# Patient Record
Sex: Female | Born: 1995 | Race: Black or African American | Hispanic: No | Marital: Single | Smoking: Never smoker
Health system: Southern US, Community
[De-identification: ages and names within clinical notes are randomized; demographics above are authoritative.]

## PROBLEM LIST (undated history)

## (undated) DIAGNOSIS — D649 Anemia, unspecified: Secondary | ICD-10-CM

## (undated) DIAGNOSIS — E282 Polycystic ovarian syndrome: Secondary | ICD-10-CM

## (undated) DIAGNOSIS — R519 Headache, unspecified: Secondary | ICD-10-CM

## (undated) DIAGNOSIS — K921 Melena: Secondary | ICD-10-CM

## (undated) DIAGNOSIS — J45909 Unspecified asthma, uncomplicated: Secondary | ICD-10-CM

## (undated) DIAGNOSIS — R87629 Unspecified abnormal cytological findings in specimens from vagina: Secondary | ICD-10-CM

## (undated) HISTORY — PX: TYMPANOSTOMY TUBE PLACEMENT: SHX32

## (undated) HISTORY — DX: Melena: K92.1

## (undated) HISTORY — PX: TONSILLECTOMY: SUR1361

## (undated) HISTORY — DX: Unspecified abnormal cytological findings in specimens from vagina: R87.629

## (undated) HISTORY — DX: Anemia, unspecified: D64.9

## (undated) HISTORY — DX: Headache, unspecified: R51.9

---
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
## 2017-01-22 ENCOUNTER — Other Ambulatory Visit (HOSPITAL_COMMUNITY)
Admission: RE | Admit: 2017-01-22 | Discharge: 2017-01-22 | Disposition: A | Discharge status: Home / Self Care | Payer: Medicaid Other | Source: Ambulatory Visit | Attending: Family Medicine | Admitting: Family Medicine

## 2017-01-22 ENCOUNTER — Other Ambulatory Visit: Payer: Medicaid Other | Admitting: *Deleted

## 2017-01-22 DIAGNOSIS — N898 Other specified noninflammatory disorders of vagina: Secondary | ICD-10-CM | POA: Diagnosis present

## 2017-01-22 NOTE — Progress Notes (Signed)
Pt is here today c/o a vaginal discharge which is whitish-yellow and is purulent with an "acidic" smell to it.  Denies any itching or irritation.  Has a history of recurrent BV.  Self swab performed and sent to lab.  GC/CT testing added as well per patient request.  Reviewed steps to prevent recurrent BV with the patient and will notify her of results and treat as necessary when results are available.

## 2017-01-23 LAB — CERVICOVAGINAL ANCILLARY ONLY
BACTERIAL VAGINITIS: POSITIVE — AB
CHLAMYDIA, DNA PROBE: POSITIVE — AB
Candida vaginitis: NEGATIVE
Neisseria Gonorrhea: NEGATIVE
Trichomonas: NEGATIVE

## 2017-01-30 ENCOUNTER — Encounter: Payer: Self-pay | Admitting: *Deleted

## 2017-01-30 ENCOUNTER — Telehealth: Payer: Self-pay | Admitting: *Deleted

## 2017-01-30 DIAGNOSIS — A749 Chlamydial infection, unspecified: Secondary | ICD-10-CM

## 2017-01-30 MED ORDER — AZITHROMYCIN 500 MG PO TABS: 1000.0000 mg | ORAL_TABLET | Freq: Once | ORAL | 1 refills | Status: AC

## 2017-01-30 NOTE — Telephone Encounter (Signed)
-----   Message from Federico FlakeKimberly Niles Newton, MD sent at 01/29/2017  9:43 AM EDT ----- Chlamydia positive. Please call in azithromycin 1000mg  with 1 refill to allow for patient treatment and partner treatment. If > 1 partner recommend partners go to health department for treatment/screening.

## 2017-01-30 NOTE — Addendum Note (Signed)
Addended by: Arne ClevelandHUTCHINSON, Suezette Lafave J on: 01/30/2017 08:15 AM   Modules accepted: Orders

## 2017-01-30 NOTE — Telephone Encounter (Signed)
Called pt on two numbers that were in her chart, both numbers disconnected at this time.  Called the emergency contact number which is pt's mother.  No answer, left message to have Ameliyah call the office.

## 2017-02-03 ENCOUNTER — Emergency Department (HOSPITAL_COMMUNITY)
Admission: EM | Admit: 2017-02-03 | Discharge: 2017-02-03 | Disposition: A | Discharge status: Home / Self Care | Payer: Medicaid Other | Attending: Physician Assistant | Admitting: Physician Assistant

## 2017-02-03 ENCOUNTER — Encounter (HOSPITAL_COMMUNITY): Payer: Self-pay | Admitting: Emergency Medicine

## 2017-02-03 ENCOUNTER — Telehealth: Payer: Self-pay | Admitting: *Deleted

## 2017-02-03 DIAGNOSIS — J45909 Unspecified asthma, uncomplicated: Secondary | ICD-10-CM | POA: Insufficient documentation

## 2017-02-03 DIAGNOSIS — B9689 Other specified bacterial agents as the cause of diseases classified elsewhere: Secondary | ICD-10-CM

## 2017-02-03 DIAGNOSIS — N760 Acute vaginitis: Secondary | ICD-10-CM

## 2017-02-03 DIAGNOSIS — Z202 Contact with and (suspected) exposure to infections with a predominantly sexual mode of transmission: Secondary | ICD-10-CM | POA: Insufficient documentation

## 2017-02-03 DIAGNOSIS — N898 Other specified noninflammatory disorders of vagina: Secondary | ICD-10-CM | POA: Insufficient documentation

## 2017-02-03 DIAGNOSIS — A749 Chlamydial infection, unspecified: Secondary | ICD-10-CM

## 2017-02-03 DIAGNOSIS — Z113 Encounter for screening for infections with a predominantly sexual mode of transmission: Secondary | ICD-10-CM | POA: Diagnosis present

## 2017-02-03 LAB — WET PREP, GENITAL
Clue Cells Wet Prep HPF POC: NONE SEEN
Sperm: NONE SEEN
Trich, Wet Prep: NONE SEEN
YEAST WET PREP: NONE SEEN

## 2017-02-03 MED ORDER — METRONIDAZOLE 500 MG PO TABS: 500.0000 mg | ORAL_TABLET | Freq: Two times a day (BID) | ORAL | 0 refills | Status: DC

## 2017-02-03 MED ORDER — CEFTRIAXONE SODIUM 250 MG IJ SOLR
250.0000 mg | Freq: Once | INTRAMUSCULAR | Status: AC
  Administered 2017-02-03: 250 mg via INTRAMUSCULAR
  Filled 2017-02-03: qty 250

## 2017-02-03 MED ORDER — LIDOCAINE HCL (PF) 1 % IJ SOLN
INTRAMUSCULAR | Status: AC
  Administered 2017-02-03: 5 mL
  Filled 2017-02-03: qty 5

## 2017-02-03 MED ORDER — AZITHROMYCIN 250 MG PO TABS
1000.0000 mg | ORAL_TABLET | Freq: Once | ORAL | Status: AC
  Administered 2017-02-03: 1000 mg via ORAL
  Filled 2017-02-03: qty 4

## 2017-02-03 MED ORDER — AZITHROMYCIN 500 MG PO TABS: 1000.0000 mg | ORAL_TABLET | Freq: Once | ORAL | 0 refills | Status: AC

## 2017-02-03 MED ORDER — FLUCONAZOLE 150 MG PO TABS: 150.0000 mg | ORAL_TABLET | Freq: Once | ORAL | 0 refills | Status: AC

## 2017-02-03 NOTE — ED Provider Notes (Signed)
[facility]-EMERG[facility]Y DEPT Provider Note   CSN: [id number] Arrival date & time: 02/03/17  1606  By signing my name below, I, Deland PrettySherilynn Knight, attest that this documentation has been prepared under the direction and in the presence of Aysa Larivee, PA-C. Electronically Signed: Deland PrettySherilynn Knight, ED Scribe. 02/03/17. 7:35 PM.  History   Chief Complaint Chief Complaint  Patient presents with  . SEXUALLY TRANSMITTED DISEASE   The history is provided by the patient. No language interpreter was used.    HPI Comments: Victorina Corliss BlackerC Olejnik is a 21 y.o. female who presents to the Emergency Department for a foul odor from vaginal discharge. She states that she was diagnosed with chlamydia (and BV) [facility] on 01/21/2017 and wanted to obtain a second opinion. She states that she was diagnosed with chlamydia before, was treated, and when she obtained a second opinion, did not have chlamydia. She is currently sexually active and her partner is getting tested tomorrow. The pt denies chills, fever, abdominal pain, nausea, vomiting, dysuria, hematuria, and pelvic pain.  Past Medical History:  Diagnosis Date  . Asthma   . Blood in stool     Patient Active Problem List   Diagnosis Date Noted  . PCOS (polycystic ovarian syndrome) 02/22/2015  . Vaginitis 07/11/2014  . Constipation 12/31/2013  . Blood in stool   . Anemia 07/01/2013  . Dysfunctional uterine bleeding 07/01/2013  . Belching 07/01/2013  . Lower abdominal pain 07/01/2013  . BMI (body mass index), pediatric, 85% to less than 95% for age 57/08/2012  . Vitamin D insufficiency 05/30/2013  . ECZEMA 06/02/2008    Past Surgical History:  Procedure Laterality Date  . TONSILLECTOMY    . TYMPANOSTOMY TUBE PLACEMENT      OB History    Gravida Para Term Preterm AB Living   0 0 0 0 0 0   SAB TAB Ectopic Multiple Live Births   0 0 0 0         Home Medications    Prior to Admission medications   Medication Sig Start Date End Date  Taking? Authorizing Provider  azithromycin (ZITHROMAX) 500 MG tablet Take 2 tablets (1,000 mg total) by mouth once. 02/03/17 02/03/17  Federico FlakeNewton, Kimberly Niles, MD  fluconazole (DIFLUCAN) 150 MG tablet Take 1 tablet (150 mg total) by mouth once. 02/03/17 02/03/17  Federico FlakeNewton, Kimberly Niles, MD  ibuprofen (ADVIL,MOTRIN) 600 MG tablet Take 1 tablet (600 mg total) by mouth every 6 (six) hours as needed. Patient not taking: Reported on 01/22/2017 09/24/16   Elpidio AnisUpstill, Shari, PA-C  metroNIDAZOLE (FLAGYL) 500 MG tablet Take 1 tablet (500 mg total) by mouth 2 (two) times daily. 02/03/17   Federico FlakeNewton, Kimberly Niles, MD  naproxen (NAPROSYN) 500 MG tablet Take 1 tablet (500 mg total) by mouth 2 (two) times daily with a meal. Patient not taking: Reported on 01/22/2017 06/28/16   Riki SheerYoung, Michelle G, PA-C    Family History Family History  Problem Relation Age of Onset  . Epilepsy Mother     Social History Social History  Substance Use Topics  . Smoking status: Never Smoker  . Smokeless tobacco: Never Used  . Alcohol use No     Allergies   Cherry and Peach [prunus persica]   Review of Systems Review of Systems  Constitutional: Negative for chills and fever.  Gastrointestinal: Negative for abdominal pain, nausea and vomiting.  Genitourinary: Positive for vaginal discharge (foul odor). Negative for dysuria, hematuria and pelvic pain.     Physical Exam Updated Vital  Signs BP 120/65 (BP Location: Left Arm)   Pulse 75   Temp 98.2 F (36.8 C) (Oral)   Ht 5\' 6"  (1.676 m)   Wt 97.1 kg (214 lb)   LMP 01/06/2017   SpO2 100%   BMI 34.54 kg/m   Physical Exam  Constitutional: She appears well-developed and well-nourished. No distress.  HENT:  Head: Normocephalic and atraumatic.  Eyes: Conjunctivae and EOM are normal. No scleral icterus.  Neck: Normal range of motion.  Pulmonary/Chest: Effort normal. No respiratory distress.  Genitourinary: There is no rash or tenderness on the right labia. There is no rash or  tenderness on the left labia. Cervix exhibits no motion tenderness. Right adnexum displays no tenderness. Left adnexum displays no tenderness. Vaginal discharge found.  Neurological: She is alert.  Skin: No rash noted. She is not diaphoretic.  Psychiatric: She has a normal mood and affect.  Nursing note and vitals reviewed.    ED Treatments / Results   DIAGNOSTIC STUDIES: Oxygen Saturation is 100% on RA, normal by my interpretation.   COORDINATION OF CARE: 7:27 PM-Discussed next steps with pt. Pt verbalized understanding and is agreeable with the plan.   Labs (all labs ordered are listed, but only abnormal results are displayed) Labs Reviewed  WET PREP, GENITAL - Abnormal; Notable for the following:       Result Value   WBC, Wet Prep HPF POC MANY (*)    All other components within normal limits  HIV ANTIBODY (ROUTINE TESTING)  RPR  GC/CHLAMYDIA PROBE AMP ([place]) NOT AT [facility]    EKG  EKG Interpretation None       Radiology No results found.  Procedures Procedures (including critical care time)  Medications Ordered in ED Medications  cefTRIAXone (ROCEPHIN) injection 250 mg (not administered)  azithromycin (ZITHROMAX) tablet 1,000 mg (not administered)  lidocaine (PF) (XYLOCAINE) 1 % injection (not administered)     Initial Impression / Assessment and Plan / ED Course  I have reviewed the triage vital signs and the nursing notes.  Pertinent labs & imaging results that were available during my care of the patient were reviewed by me and considered in my medical decision making (see chart for details).     Patient presents to the ED for STD testing. She states that she was tested positive for chlamydia on June 26 at [facility] but reports "I want a second opinion." She denies any vaginal discharge or abdominal pain. She states that she does have foul-smelling odor from vaginal area. She denies any urinary symptoms. On physical exam there is white vaginal  discharge present. There is no cervical motion tenderness or adnexal tenderness present which is low suspicion for ovarian abscess or PID. No visible lesions or rashes present. Gonorrhea Chlamydia, RPR and HIV pending. Patient states that she has prescription for Flagyl given by the women[facility] clinic today for BV, which she does not have on today's results. Advised patient to follow up with the results of her remaining lab work. She appears stable for discharge at this time. Strict return precautions given.  Final Clinical Impressions(s) / ED Diagnoses   Final diagnoses:  STD exposure  Vaginal discharge    New Prescriptions New Prescriptions   No medications on file   I personally performed the services described in this documentation, which was scribed in my presence. The recorded information has been reviewed and is accurate.     Dietrich Pates, PA-C 02/03/17 2057    Abelino Derrick, MD 02/03/17  2102  

## 2017-02-03 NOTE — ED Notes (Signed)
Pt would like a second opinion on possible STD diagnosis. No somplaints of s/s. States her urine smells more acidic

## 2017-02-03 NOTE — ED Triage Notes (Signed)
PT. Stated [facility] and tested positive for Chlamydia and I want a second opinion

## 2017-02-03 NOTE — Telephone Encounter (Signed)
Pt returned call in regards to test results, informed her of + CT and BV on culture.  Sent medications to pharmacy and instructed on medication use.  Pt asked for Diflucan to be sent as well as a prophylactic for yeast due to antibiotic use. Instructed pt to refrain from sexual contact until partner was properly treated.  Pt acknowledged instructions.

## 2017-02-03 NOTE — Discharge Instructions (Signed)
Please read attached information regarding your condition.  Return to ED for worsening pain, increased discharge, abnormal bleeding, fever, vomiting.

## 2017-02-03 NOTE — ED Notes (Signed)
Pt verbalized understanding of d/c instructions and has no further questions. Pt is stable, A&Ox4, VSS.  

## 2017-02-04 LAB — GC/CHLAMYDIA PROBE AMP (~~LOC~~) NOT AT ARMC
Chlamydia: POSITIVE — AB
NEISSERIA GONORRHEA: NEGATIVE

## 2017-02-04 LAB — HIV ANTIBODY (ROUTINE TESTING W REFLEX): HIV SCREEN 4TH GENERATION: NONREACTIVE

## 2017-02-04 LAB — RPR: RPR Ser Ql: NONREACTIVE

## 2017-02-21 ENCOUNTER — Encounter (HOSPITAL_COMMUNITY): Payer: Self-pay | Admitting: Emergency Medicine

## 2017-02-21 ENCOUNTER — Emergency Department (HOSPITAL_COMMUNITY)
Admission: EM | Admit: 2017-02-21 | Discharge: 2017-02-21 | Disposition: A | Discharge status: Home / Self Care | Payer: Medicaid Other | Attending: Emergency Medicine | Admitting: Emergency Medicine

## 2017-02-21 DIAGNOSIS — Y929 Unspecified place or not applicable: Secondary | ICD-10-CM | POA: Diagnosis not present

## 2017-02-21 DIAGNOSIS — Y999 Unspecified external cause status: Secondary | ICD-10-CM | POA: Insufficient documentation

## 2017-02-21 DIAGNOSIS — J45909 Unspecified asthma, uncomplicated: Secondary | ICD-10-CM | POA: Insufficient documentation

## 2017-02-21 DIAGNOSIS — Y9389 Activity, other specified: Secondary | ICD-10-CM | POA: Insufficient documentation

## 2017-02-21 DIAGNOSIS — S61307A Unspecified open wound of left little finger with damage to nail, initial encounter: Secondary | ICD-10-CM | POA: Insufficient documentation

## 2017-02-21 DIAGNOSIS — S61309A Unspecified open wound of unspecified finger with damage to nail, initial encounter: Secondary | ICD-10-CM

## 2017-02-21 DIAGNOSIS — S6992XA Unspecified injury of left wrist, hand and finger(s), initial encounter: Secondary | ICD-10-CM | POA: Diagnosis present

## 2017-02-21 DIAGNOSIS — W231XXA Caught, crushed, jammed, or pinched between stationary objects, initial encounter: Secondary | ICD-10-CM | POA: Diagnosis not present

## 2017-02-21 MED ORDER — LIDOCAINE HCL (PF) 1 % IJ SOLN
10.0000 mL | Freq: Once | INTRAMUSCULAR | Status: AC
  Administered 2017-02-21: 10 mL via INTRADERMAL
  Filled 2017-02-21: qty 10

## 2017-02-21 NOTE — ED Triage Notes (Signed)
Pt presents with injury to the left pinky nail , acrylic on nail; some blood present; pt states the nail has been lifted off the finger; unsure what to do

## 2017-02-21 NOTE — ED Provider Notes (Signed)
[facility] Provider Note   CSN: [id number] Arrival date & time: 02/21/17  0209     History   Chief Complaint Chief Complaint  Patient presents with  . Finger Injury    HPI [name] Collier is a 21 y.o. female with a hx of asthma, PCOS, anemia presents to the Emergency Department complaining of acute, persistent, left pinky pain onset around 2 AM after catching her fingernail and bending it back towards her hand.  She reports a [place] amount of bleeding and associated pain.  Pt denies diabetes, HIV, IVDU.  She does have acrylic nails.  No treatments PTA.  Nothing makes it better; Palpation makes it worse.  Associated symptoms include fever, chills, numbness, decreased movement.     The history is provided by the patient and medical records. No language interpreter was used.    Past Medical History:  Diagnosis Date  . Asthma   . Blood in stool     Patient Active Problem List   Diagnosis Date Noted  . PCOS (polycystic ovarian syndrome) 02/22/2015  . Vaginitis 07/11/2014  . Constipation 12/31/2013  . Blood in stool   . Anemia 07/01/2013  . Dysfunctional uterine bleeding 07/01/2013  . Belching 07/01/2013  . Lower abdominal pain 07/01/2013  . BMI (body mass index), pediatric, 85% to less than 95% for age 79/08/2012  . Vitamin D insufficiency 05/30/2013  . ECZEMA 06/02/2008    Past Surgical History:  Procedure Laterality Date  . TONSILLECTOMY    . TYMPANOSTOMY TUBE PLACEMENT      OB History    Gravida Para Term Preterm AB Living   0 0 0 0 0 0   SAB TAB Ectopic Multiple Live Births   0 0 0 0         Home Medications    Prior to Admission medications   Medication Sig Start Date End Date Taking? Authorizing Provider  ibuprofen (ADVIL,MOTRIN) 600 MG tablet Take 1 tablet (600 mg total) by mouth every 6 (six) hours as needed. Patient not taking: Reported on 01/22/2017 09/24/16   Elpidio AnisUpstill, Shari, PA-C  metroNIDAZOLE (FLAGYL) 500 MG tablet Take 1 tablet (500 mg  total) by mouth 2 (two) times daily. 02/03/17   Federico FlakeNewton, Kimberly Niles, MD  naproxen (NAPROSYN) 500 MG tablet Take 1 tablet (500 mg total) by mouth 2 (two) times daily with a meal. Patient not taking: Reported on 01/22/2017 06/28/16   Riki SheerYoung, Michelle G, PA-C    Family History Family History  Problem Relation Age of Onset  . Epilepsy Mother     Social History Social History  Substance Use Topics  . Smoking status: Never Smoker  . Smokeless tobacco: Never Used  . Alcohol use No     Allergies   Cherry and Peach [prunus persica]   Review of Systems Review of Systems  Constitutional: Negative for appetite change, diaphoresis, fatigue, fever and unexpected weight change.  HENT: Negative for mouth sores.   Eyes: Negative for visual disturbance.  Respiratory: Negative for cough, chest tightness, shortness of breath and wheezing.   Cardiovascular: Negative for chest pain.  Gastrointestinal: Negative for abdominal pain, constipation, diarrhea, nausea and vomiting.  Endocrine: Negative for polydipsia, polyphagia and polyuria.  Genitourinary: Negative for dysuria, frequency, hematuria and urgency.  Musculoskeletal: Negative for back pain and neck stiffness.  Skin: Positive for wound. Negative for rash.  Allergic/Immunologic: Negative for immunocompromised state.  Neurological: Negative for syncope, light-headedness and headaches.  Hematological: Does not bruise/bleed easily.  Psychiatric/Behavioral: Negative for sleep  disturbance. The patient is not nervous/anxious.   All other systems reviewed and are negative.    Physical Exam Updated Vital Signs BP 123/65 (BP Location: Left Arm)   Pulse 81   Temp 97.7 F (36.5 C) (Oral)   Resp 18   SpO2 100%   Physical Exam  Constitutional: She appears well-developed and well-nourished. No distress.  HENT:  Head: Normocephalic and atraumatic.  Eyes: Conjunctivae are normal.  Neck: Normal range of motion.  Cardiovascular: Normal rate,  regular rhythm and intact distal pulses.   Capillary refill < 3 sec  Pulmonary/Chest: Effort normal and breath sounds normal.  Musculoskeletal: She exhibits no edema.  Left little finger with partially avulsed acrylic nail.  Full range of motion of left little finger, hemostasis achieved. Sensation intact to the distal tip. Drinks 5/5 with flexion and extension of the DIP, PIP and MCP of the little finger.  Neurological: She is alert. She exhibits normal muscle tone. Coordination normal.  Skin: Skin is warm and dry. She is not diaphoretic.  No tenting of the skin  Psychiatric: She has a normal mood and affect.  Nursing note and vitals reviewed.    ED Treatments / Results   Procedures .Nail Removal Date/Time: 02/21/2017 5:23 AM Performed by: Dierdre ForthMUTHERSBAUGH, Amit Meloy Authorized by: Dierdre ForthMUTHERSBAUGH, Pandora Mccrackin   Consent:    Consent obtained:  Verbal   Consent given by:  Patient   Risks discussed:  Bleeding, incomplete removal and infection   Alternatives discussed:  No treatment Location:    Hand:  L Sensing finger Pre-procedure details:    Skin preparation:  ChloraPrep   Preparation: Patient was prepped and draped in the usual sterile fashion   Anesthesia (see MAR for exact dosages):    Anesthesia method:  Nerve block   Block anesthetic:  Lidocaine 1% w/o epi   Block injection procedure:  Anatomic landmarks identified, introduced needle, incremental injection and negative aspiration for blood   Block outcome:  Anesthesia achieved Nail Removal:    Nail removed:  Complete   Nail bed repaired: no (no laceration)     Stented with:  Petroleum gauze Post-procedure details:    Dressing:  Xeroform gauze   Patient tolerance of procedure:  Tolerated well, no immediate complications   (including critical care time)  Medications Ordered in ED Medications  lidocaine (PF) (XYLOCAINE) 1 % injection 10 mL (10 mLs Intradermal Given 02/21/17 0503)     Initial Impression / Assessment and Plan / ED  Course  I have reviewed the triage vital signs and the nursing notes.  Pertinent labs & imaging results that were available during my care of the patient were reviewed by me and considered in my medical decision making (see chart for details).     Patient presents with partially avulsed left little nail. Nail completely removed after anesthesia achieved. No laceration to the nail bed for which suturing would be possible. Hemostasis achieved prior to nail removal. Due to acrylic nail, original was unable to be replaced and anchored. Nailbed was stented with petroleum gauze. Dressing applied. Wound care instructions given. Patient states understanding and is in agreement with the plan. No current evidence of secondary infection. Discussed reasons to return immediately to the emergency department including signs and symptoms of infection.  Final Clinical Impressions(s) / ED Diagnoses   Final diagnoses:  Nail avulsion, finger, initial encounter    New Prescriptions New Prescriptions   No medications on file     Milta DeitersMuthersbaugh, Armenta Erskin, PA-C 02/21/17 [id number]  Azalia Bilis, MD 02/22/17 [phone]

## 2017-02-21 NOTE — Discharge Instructions (Signed)
1. Medications: Tylenol or ibuprofen for pain, usual home medications 2. Treatment: rest, drink plenty of fluids, leave bandage in place for the first 24 hours. Gently remove and wash with warm soap and water. Do not remove petroleum gauze use for splinting. 3. Follow Up: Please followup with your primary doctor in 3-5 days for discussion of your diagnoses and further evaluation after today's visit; if you do not have a primary care doctor use the resource guide provided to find one; Please return to the ER for purulent drainage, redness, worsening pain or signs of secondary infection.

## 2017-03-04 ENCOUNTER — Emergency Department (HOSPITAL_COMMUNITY)
Admission: EM | Admit: 2017-03-04 | Discharge: 2017-03-04 | Disposition: A | Discharge status: Home / Self Care | Payer: Medicaid Other | Attending: Emergency Medicine | Admitting: Emergency Medicine

## 2017-03-04 ENCOUNTER — Encounter (HOSPITAL_COMMUNITY): Payer: Self-pay

## 2017-03-04 ENCOUNTER — Other Ambulatory Visit: Payer: Medicaid Other | Admitting: *Deleted

## 2017-03-04 DIAGNOSIS — Z5321 Procedure and treatment not carried out due to patient leaving prior to being seen by health care provider: Secondary | ICD-10-CM | POA: Insufficient documentation

## 2017-03-04 DIAGNOSIS — R202 Paresthesia of skin: Secondary | ICD-10-CM | POA: Insufficient documentation

## 2017-03-04 DIAGNOSIS — R198 Other specified symptoms and signs involving the digestive system and abdomen: Secondary | ICD-10-CM | POA: Insufficient documentation

## 2017-03-04 NOTE — Progress Notes (Deleted)
SUBJECTIVE:  21 y.o. female complains of {pe vag discharge desc:315065} vaginal discharge for *** {gen duration:315003}. Denies abnormal vaginal bleeding or significant pelvic pain or fever. No UTI symptoms. Denies history of known exposure to STD.  No LMP recorded. Patient is not currently having periods (Reason: Irregular Periods).  OBJECTIVE:  She appears well, afebrile. Urine dipstick: {ua dip:315113}.  ASSESSMENT:  Vaginal Discharge  Vaginal Odor   PLAN:  GC, chlamydia, trichomonas, BVAG, CVAG probe sent to lab. Treatment: To be determined once lab results are received ROV prn if symptoms persist or worsen.

## 2017-03-04 NOTE — ED Triage Notes (Signed)
Pt states she ate a banana 2 days ago that caused upset stomach and lip tingling. Pt denies any shortness of breath. Lung sounds clear. Airway intact, no oral swelling noted. resp e/u. Pt does have allergies to other fruits.

## 2017-05-09 ENCOUNTER — Emergency Department (HOSPITAL_COMMUNITY): Payer: Self-pay

## 2017-05-09 ENCOUNTER — Encounter (HOSPITAL_COMMUNITY): Payer: Self-pay | Admitting: Emergency Medicine

## 2017-05-09 ENCOUNTER — Emergency Department (HOSPITAL_COMMUNITY)
Admission: EM | Admit: 2017-05-09 | Discharge: 2017-05-10 | Disposition: A | Discharge status: Home / Self Care | Payer: Self-pay | Attending: Emergency Medicine | Admitting: Emergency Medicine

## 2017-05-09 DIAGNOSIS — J4521 Mild intermittent asthma with (acute) exacerbation: Secondary | ICD-10-CM | POA: Insufficient documentation

## 2017-05-09 MED ORDER — IPRATROPIUM-ALBUTEROL 0.5-2.5 (3) MG/3ML IN SOLN
3.0000 mL | Freq: Once | RESPIRATORY_TRACT | Status: AC
  Administered 2017-05-10: 3 mL via RESPIRATORY_TRACT
  Filled 2017-05-09: qty 3

## 2017-05-09 MED ORDER — PREDNISONE 20 MG PO TABS
40.0000 mg | ORAL_TABLET | Freq: Once | ORAL | Status: AC
  Administered 2017-05-10: 40 mg via ORAL
  Filled 2017-05-09: qty 2

## 2017-05-09 MED ORDER — ALBUTEROL SULFATE (2.5 MG/3ML) 0.083% IN NEBU
INHALATION_SOLUTION | RESPIRATORY_TRACT | Status: AC
  Filled 2017-05-09: qty 6

## 2017-05-09 MED ORDER — ALBUTEROL SULFATE (2.5 MG/3ML) 0.083% IN NEBU
5.0000 mg | INHALATION_SOLUTION | Freq: Once | RESPIRATORY_TRACT | Status: AC
  Administered 2017-05-09: 5 mg via RESPIRATORY_TRACT

## 2017-05-09 NOTE — ED Notes (Signed)
Pt. Ambulated to room with steady gait. 

## 2017-05-09 NOTE — ED Triage Notes (Signed)
Patient with asthma, been using her inhaler with no relief of the shortness of breath.  Patient states that she is feeling tight.  Slight exp wheezing in triage.

## 2017-05-09 NOTE — ED Notes (Signed)
Pt. Reports having an asthma attack. Pt. Got albuterol treatment. Pt. Reports not feeling short of breath or difficulty breathing.

## 2017-05-10 MED ORDER — PREDNISONE 20 MG PO TABS: 40.0000 mg | ORAL_TABLET | Freq: Every day | ORAL | 0 refills | Status: DC

## 2017-05-10 MED ORDER — BENZONATATE 100 MG PO CAPS: 100.0000 mg | ORAL_CAPSULE | Freq: Three times a day (TID) | ORAL | 0 refills | Status: DC

## 2017-05-10 NOTE — ED Notes (Signed)
Pt. SPO2 100% will ambulated.

## 2017-05-10 NOTE — Discharge Instructions (Signed)
You have been treated for an asthma attack in the ED. Take prednisone daily for 4 days starting tomorrow morning.   You may use the tessalon for cough.   Warm steam from a hot shower will help your symptoms.   Follow up with your primary care provider for further discussion of today's ER visit.   If you develop any new or worsening symptoms, including but not limited to fever, persistent vomiting, worsening shortness of breath or other symptoms that concern you, please return to the Emergency Department immediately.

## 2017-05-10 NOTE — ED Provider Notes (Signed)
[facility]-EMERGENCY DEPT Provider Note   CSN: [id number] Arrival date & time: 05/09/17  2049     History   Chief Complaint Chief Complaint  Patient presents with  . Shortness of Breath  . Asthma    HPI [name] Collier is a 21 y.o. female.  HPI 21 year old African-American female past medical history significant for asthma presents to the emergency department today with complaints of asthma exacerbation. Patient states that earlier this evening she was developing chest tightness and wheezing. States that she used her inhaler with little relief. She reports some mild shortness of breath. States this is typical of her asthma attacks. States that the season changes makes her symptoms worse. Patient was given breathing treatment in the lobby and feels much improved. Patient reports a nonproductive cough. Denies any associated fevers, chills, rhinorrhea, sore throat, otalgia. Denies any chest pain. Patient denies any OCP use, prolonged immobilization, recent hospitalizations/surgeries, unilateral leg swelling or calf tenderness, history of PE/DVT. Patient denies any tobacco use. Denies any history of intubation or requiring admission for asthma. States that she has been having to use her inhaler more frequently. Has not followed up with her PCP for further management of asthma. Past Medical History:  Diagnosis Date  . Asthma   . Blood in stool     Patient Active Problem List   Diagnosis Date Noted  . PCOS (polycystic ovarian syndrome) 02/22/2015  . Vaginitis 07/11/2014  . Constipation 12/31/2013  . Blood in stool   . Anemia 07/01/2013  . Dysfunctional uterine bleeding 07/01/2013  . Belching 07/01/2013  . Lower abdominal pain 07/01/2013  . BMI (body mass index), pediatric, 85% to less than 95% for age 75/08/2012  . Vitamin D insufficiency 05/30/2013  . ECZEMA 06/02/2008    Past Surgical History:  Procedure Laterality Date  . TONSILLECTOMY    . TYMPANOSTOMY TUBE PLACEMENT      OB  History    Gravida Para Term Preterm AB Living   0 0 0 0 0 0   SAB TAB Ectopic Multiple Live Births   0 0 0 0         Home Medications    Prior to Admission medications   Medication Sig Start Date End Date Taking? Authorizing Provider  benzonatate (TESSALON) 100 MG capsule Take 1 capsule (100 mg total) by mouth every 8 (eight) hours. 05/10/17   Rise Mu, PA-C  ibuprofen (ADVIL,MOTRIN) 600 MG tablet Take 1 tablet (600 mg total) by mouth every 6 (six) hours as needed. Patient not taking: Reported on 01/22/2017 09/24/16   Elpidio Anis, PA-C  metroNIDAZOLE (FLAGYL) 500 MG tablet Take 1 tablet (500 mg total) by mouth 2 (two) times daily. 02/03/17   Federico Flake, MD  naproxen (NAPROSYN) 500 MG tablet Take 1 tablet (500 mg total) by mouth 2 (two) times daily with a meal. Patient not taking: Reported on 01/22/2017 06/28/16   Riki Sheer, PA-C  predniSONE (DELTASONE) 20 MG tablet Take 2 tablets (40 mg total) by mouth daily with breakfast. 05/10/17   Rise Mu, PA-C    Family History Family History  Problem Relation Age of Onset  . Epilepsy Mother     Social History Social History  Substance Use Topics  . Smoking status: Never Smoker  . Smokeless tobacco: Never Used  . Alcohol use No     Allergies   Cherry and Peach [prunus persica]   Review of Systems Review of Systems  Constitutional: Negative for chills and fever.  HENT: Negative for congestion, ear pain and sore throat.   Respiratory: Positive for cough, chest tightness, shortness of breath and wheezing.   Cardiovascular: Negative for chest pain, palpitations and leg swelling.  Gastrointestinal: Negative for nausea and vomiting.  Musculoskeletal: Negative for myalgias.  Skin: Negative for rash.     Physical Exam Updated Vital Signs BP 112/74   Pulse 68   Temp 97.7 F (36.5 C) (Oral)   Resp 18   SpO2 100%   Physical Exam  Constitutional: She appears well-developed and  well-nourished. No distress.  HENT:  Head: Normocephalic and atraumatic.  Eyes: Right eye exhibits no discharge. Left eye exhibits no discharge. No scleral icterus.  Neck: Normal range of motion.  Cardiovascular: Normal rate, regular rhythm, normal heart sounds and intact distal pulses.  Exam reveals no gallop and no friction rub.   No murmur heard. Pulmonary/Chest: Effort normal and breath sounds normal. No accessory muscle usage. No tachypnea. No respiratory distress. She has no decreased breath sounds. She has no wheezes. She has no rales. She exhibits no tenderness.  Abdominal: Soft. Bowel sounds are normal.  Musculoskeletal: Normal range of motion.  No lower extremity edema or calf tenderness.  Neurological: She is alert.  Skin: No pallor.  Psychiatric: Her behavior is normal. Judgment and thought content normal.  Nursing note and vitals reviewed.    ED Treatments / Results  Labs (all labs ordered are listed, but only abnormal results are displayed) Labs Reviewed - No data to display  EKG  EKG Interpretation None       Radiology Dg Chest 2 View  Result Date: 05/09/2017 CLINICAL DATA:  shortness of breath, asthma EXAM: CHEST  2 VIEW COMPARISON:  08/15/16. FINDINGS: The lungs are clear. The pulmonary vasculature is normal. Heart size is normal. Hilar and mediastinal contours are unremarkable. There is no pleural effusion. IMPRESSION: No active cardiopulmonary disease. Electronically Signed   By: Ellery Plunk M.D.   On: 05/09/2017 21:46    Procedures Procedures (including critical care time)  Medications Ordered in ED Medications  albuterol (PROVENTIL) (2.5 MG/3ML) 0.083% nebulizer solution 5 mg (5 mg Nebulization Given 05/09/17 2113)  ipratropium-albuterol (DUONEB) 0.5-2.5 (3) MG/3ML nebulizer solution 3 mL (3 mLs Nebulization Given 05/10/17 0006)  predniSONE (DELTASONE) tablet 40 mg (40 mg Oral Given 05/10/17 0006)     Initial Impression / Assessment and Plan /  ED Course  I have reviewed the triage vital signs and the nursing notes.  Pertinent labs & imaging results that were available during my care of the patient were reviewed by me and considered in my medical decision making (see chart for details).     Patient ambulated in ED with O2 saturations maintained >90, no current signs of respiratory distress. Lung exam improved after nebulizer treatment. Prednisone given in the ED and pt will bd dc with 5 day burst. Pt states they are breathing at baseline. Pt has been instructed to continue using prescribed medications and to speak with PCP about today's exacerbation. Patient's presentation does not seem consistent with PE. Vital signs are reassuring.   Final Clinical Impressions(s) / ED Diagnoses   Final diagnoses:  Mild intermittent asthma with exacerbation    New Prescriptions New Prescriptions   BENZONATATE (TESSALON) 100 MG CAPSULE    Take 1 capsule (100 mg total) by mouth every 8 (eight) hours.   PREDNISONE (DELTASONE) 20 MG TABLET    Take 2 tablets (40 mg total) by mouth daily with breakfast.  Rise Mu, PA-C 05/10/17 1191    Nicanor Alcon, April, MD 05/10/17 4782

## 2017-07-31 ENCOUNTER — Other Ambulatory Visit (HOSPITAL_COMMUNITY)
Admission: RE | Admit: 2017-07-31 | Discharge: 2017-07-31 | Disposition: A | Discharge status: Home / Self Care | Payer: Medicaid Other | Source: Ambulatory Visit | Attending: Family Medicine | Admitting: Family Medicine

## 2017-07-31 ENCOUNTER — Other Ambulatory Visit: Payer: Self-pay

## 2017-07-31 DIAGNOSIS — Z202 Contact with and (suspected) exposure to infections with a predominantly sexual mode of transmission: Secondary | ICD-10-CM

## 2017-07-31 DIAGNOSIS — A749 Chlamydial infection, unspecified: Secondary | ICD-10-CM | POA: Insufficient documentation

## 2017-07-31 DIAGNOSIS — B9689 Other specified bacterial agents as the cause of diseases classified elsewhere: Secondary | ICD-10-CM | POA: Insufficient documentation

## 2017-07-31 NOTE — Progress Notes (Signed)
Patient presented to the office today for self swab. Patient stated she would like to be check for all sti. She reports not having any symptoms but did have un protected sex. Patient instructed on how to self swab... Specimen label and sent to the lab. Patient advised we will call her back once results have come back.

## 2017-08-01 LAB — RPR: RPR Ser Ql: NONREACTIVE

## 2017-08-01 LAB — CERVICOVAGINAL ANCILLARY ONLY
Bacterial vaginitis: POSITIVE — AB
CANDIDA VAGINITIS: NEGATIVE
CHLAMYDIA, DNA PROBE: POSITIVE — AB
Neisseria Gonorrhea: NEGATIVE
TRICH (WINDOWPATH): NEGATIVE

## 2017-08-01 LAB — HIV ANTIBODY (ROUTINE TESTING W REFLEX): HIV SCREEN 4TH GENERATION: NONREACTIVE

## 2017-08-04 ENCOUNTER — Other Ambulatory Visit: Payer: Self-pay | Admitting: Family Medicine

## 2017-08-04 DIAGNOSIS — B9689 Other specified bacterial agents as the cause of diseases classified elsewhere: Secondary | ICD-10-CM

## 2017-08-04 DIAGNOSIS — N760 Acute vaginitis: Secondary | ICD-10-CM

## 2017-08-04 DIAGNOSIS — A749 Chlamydial infection, unspecified: Secondary | ICD-10-CM

## 2017-08-04 MED ORDER — METRONIDAZOLE 500 MG PO TABS: 500.0000 mg | ORAL_TABLET | Freq: Two times a day (BID) | ORAL | 0 refills | Status: DC

## 2017-08-04 MED ORDER — AZITHROMYCIN 500 MG PO TABS: 1000.0000 mg | ORAL_TABLET | Freq: Once | ORAL | 1 refills | Status: AC

## 2018-02-15 ENCOUNTER — Encounter (HOSPITAL_COMMUNITY): Payer: Self-pay

## 2018-02-15 ENCOUNTER — Emergency Department (HOSPITAL_COMMUNITY)
Admission: EM | Admit: 2018-02-15 | Discharge: 2018-02-15 | Disposition: A | Discharge status: Home / Self Care | Payer: Medicaid Other | Attending: Emergency Medicine | Admitting: Emergency Medicine

## 2018-02-15 DIAGNOSIS — N939 Abnormal uterine and vaginal bleeding, unspecified: Secondary | ICD-10-CM

## 2018-02-15 DIAGNOSIS — N938 Other specified abnormal uterine and vaginal bleeding: Secondary | ICD-10-CM | POA: Insufficient documentation

## 2018-02-15 DIAGNOSIS — R55 Syncope and collapse: Secondary | ICD-10-CM

## 2018-02-15 DIAGNOSIS — J45909 Unspecified asthma, uncomplicated: Secondary | ICD-10-CM | POA: Insufficient documentation

## 2018-02-15 DIAGNOSIS — N898 Other specified noninflammatory disorders of vagina: Secondary | ICD-10-CM | POA: Insufficient documentation

## 2018-02-15 LAB — COMPREHENSIVE METABOLIC PANEL
ALBUMIN: 4 g/dL (ref 3.5–5.0)
ALT: 11 U/L (ref 0–44)
AST: 18 U/L (ref 15–41)
Alkaline Phosphatase: 51 U/L (ref 38–126)
Anion gap: 9 (ref 5–15)
BUN: 7 mg/dL (ref 6–20)
CHLORIDE: 102 mmol/L (ref 98–111)
CO2: 25 mmol/L (ref 22–32)
CREATININE: 0.89 mg/dL (ref 0.44–1.00)
Calcium: 9.1 mg/dL (ref 8.9–10.3)
GFR calc Af Amer: >60 mL/min (ref >60)
GLUCOSE: 131 mg/dL — AB (ref 70–99)
POTASSIUM: 3.7 mmol/L (ref 3.5–5.1)
Sodium: 136 mmol/L (ref 135–145)
Total Bilirubin: 0.8 mg/dL (ref 0.3–1.2)
Total Protein: 7.1 g/dL (ref 6.5–8.1)

## 2018-02-15 LAB — CBC WITH DIFFERENTIAL/PLATELET
Abs Immature Granulocytes: 0 10*3/uL (ref 0.0–0.1)
Basophils Absolute: 0 10*3/uL (ref 0.0–0.1)
Basophils Relative: 0 %
EOS ABS: 0 10*3/uL (ref 0.0–0.7)
EOS PCT: 0 %
HEMATOCRIT: 36.1 % (ref 36.0–46.0)
HEMOGLOBIN: 11.1 g/dL — AB (ref 12.0–15.0)
Immature Granulocytes: 0 %
LYMPHS ABS: 0.7 10*3/uL (ref 0.7–4.0)
LYMPHS PCT: 15 %
MCH: 24.4 pg — ABNORMAL LOW (ref 26.0–34.0)
MCHC: 30.7 g/dL (ref 30.0–36.0)
MCV: 79.3 fL (ref 78.0–100.0)
MONO ABS: 0.5 10*3/uL (ref 0.1–1.0)
MONOS PCT: 10 %
Neutro Abs: 3.4 10*3/uL (ref 1.7–7.7)
Neutrophils Relative %: 75 %
Platelets: 183 10*3/uL (ref 150–400)
RBC: 4.55 MIL/uL (ref 3.87–5.11)
RDW: 15.3 % (ref 11.5–15.5)
WBC: 4.6 10*3/uL (ref 4.0–10.5)

## 2018-02-15 LAB — I-STAT BETA HCG BLOOD, ED (MC, WL, AP ONLY): I-stat hCG, quantitative: <5 m[IU]/mL (ref <5)

## 2018-02-15 LAB — LIPASE, BLOOD: Lipase: 26 U/L (ref 11–51)

## 2018-02-15 LAB — WET PREP, GENITAL
SPERM: NONE SEEN
Trich, Wet Prep: NONE SEEN
Yeast Wet Prep HPF POC: NONE SEEN

## 2018-02-15 LAB — I-STAT CG4 LACTIC ACID, ED
LACTIC ACID, VENOUS: 1.11 mmol/L (ref 0.5–1.9)
LACTIC ACID, VENOUS: 0.74 mmol/L (ref 0.5–1.9)

## 2018-02-15 MED ORDER — CEFTRIAXONE SODIUM 250 MG IJ SOLR
250.0000 mg | Freq: Once | INTRAMUSCULAR | Status: AC
Start: 2018-02-15 — End: 2018-02-15
  Administered 2018-02-15: 250 mg via INTRAMUSCULAR
  Filled 2018-02-15: qty 250

## 2018-02-15 MED ORDER — ACETAMINOPHEN 325 MG PO TABS
650.0000 mg | ORAL_TABLET | Freq: Once | ORAL | Status: AC | PRN
  Administered 2018-02-15: 650 mg via ORAL
  Filled 2018-02-15: qty 2

## 2018-02-15 MED ORDER — AZITHROMYCIN 250 MG PO TABS
1000.0000 mg | ORAL_TABLET | Freq: Once | ORAL | Status: AC
  Administered 2018-02-15: 1000 mg via ORAL
  Filled 2018-02-15: qty 4

## 2018-02-15 MED ORDER — ACYCLOVIR 400 MG PO TABS: 400.0000 mg | ORAL_TABLET | Freq: Three times a day (TID) | ORAL | 0 refills | Status: DC

## 2018-02-15 NOTE — ED Notes (Signed)
Discharge instructions and prescriptions discussed with Pt. Pt verbalized understanding. Pt stable and ambulatory.   

## 2018-02-15 NOTE — ED Notes (Signed)
Pt presents for evaluation of multiple syncopal episodes since yesterday. Pt reports she has irregular periods, started her LMP 6/30 and has been bleeding since. States started using new tampons yesterday and now is having genital swelling that she thought was related to the tampons. States no hx of syncope. Was running fever this morning, abdominal pressure worse with standing.

## 2018-02-15 NOTE — Discharge Instructions (Addendum)
1.  Schedule a follow-up with your gynecologist as soon as possible.  Because the lesions on your vagina looks suspicious for herpes, you are getting treatment.  See further instructions and information in your discharge instructions.  Anyone who has suspected sexually transmitted disease, also gets treatment for gonorrhea and chlamydia.  You have been given Rocephin and Zithromax in the emergency department.  Results from these tests will be ready in 2 to 3 days. 2.  Drink plenty of fluids and eat food with slightly higher salt content.  Careful when you transition from sitting to standing.  If you feel lightheaded, lie back down and elevate your legs. 3.  Return to the emergency department if symptoms are worsening or changing.

## 2018-02-15 NOTE — ED Provider Notes (Signed)
[facility] EMERGENCY DEPARTMENT Provider Note   CSN: [id number] Arrival date & time: 02/15/18  1134     History   Chief Complaint Chief Complaint  Patient presents with  . Loss of Consciousness  . Vaginal Bleeding    HPI [name] Collier is a 22 y.o. female.  HPI Patient reports that she has had irregular menstrual bleeding problems for some time.  She reports she has been tried on hormonal birth control but symptoms got worse.  She had tried an IUD but describes a uterine puncture.  Thus, currently she is not on any type of birth control.  She reports that she has had menstrual bleeding for 20 days.  It is wax and wane in degree of severity.  Patient reports minimal lower abdominal discomfort.  He presents to the emergency department today because she has had several episodes of passing out.  She reports last night she got up to go to the bathroom to wash her hands and suddenly passed out and hit her forehead on the tub.  Today she went into the bathroom to try to urinate and again passed out.  He denies she is having any preceding palpitations or chest pain.  She gets to feeling lightheaded pretty quickly and then goes out. Patient reports that she is also noted vulvar swelling and burning discomfort.  She just noticed this over the past couple of days.  Reports she has been in a relationship with the same person for quite a while and does not suspect sexually transmitted disease. Past Medical History:  Diagnosis Date  . Asthma   . Blood in stool     Patient Active Problem List   Diagnosis Date Noted  . PCOS (polycystic ovarian syndrome) 02/22/2015  . Vaginitis 07/11/2014  . Constipation 12/31/2013  . Blood in stool   . Anemia 07/01/2013  . Dysfunctional uterine bleeding 07/01/2013  . Belching 07/01/2013  . Lower abdominal pain 07/01/2013  . BMI (body mass index), pediatric, 85% to less than 95% for age 25/08/2012  . Vitamin D insufficiency 05/30/2013  .  ECZEMA 06/02/2008    Past Surgical History:  Procedure Laterality Date  . TONSILLECTOMY    . TYMPANOSTOMY TUBE PLACEMENT       OB History    Gravida  0   Para  0   Term  0   Preterm  0   AB  0   Living  0     SAB  0   TAB  0   Ectopic  0   Multiple  0   Live Births               Home Medications    Prior to Admission medications   Medication Sig Start Date End Date Taking? Authorizing Provider  acyclovir (ZOVIRAX) 400 MG tablet Take 1 tablet (400 mg total) by mouth 3 (three) times daily. 02/15/18   Arby Barrette, MD  benzonatate (TESSALON) 100 MG capsule Take 1 capsule (100 mg total) by mouth every 8 (eight) hours. Patient not taking: Reported on 07/31/2017 05/10/17   Demetrios Loll T, PA-C  ibuprofen (ADVIL,MOTRIN) 600 MG tablet Take 1 tablet (600 mg total) by mouth every 6 (six) hours as needed. Patient not taking: Reported on 01/22/2017 09/24/16   Elpidio Anis, PA-C  metroNIDAZOLE (FLAGYL) 500 MG tablet Take 1 tablet (500 mg total) by mouth 2 (two) times daily. 08/04/17   Reva Bores, MD  naproxen (NAPROSYN) 500 MG tablet Take  1 tablet (500 mg total) by mouth 2 (two) times daily with a meal. Patient not taking: Reported on 01/22/2017 06/28/16   Riki Sheer, PA-C  predniSONE (DELTASONE) 20 MG tablet Take 2 tablets (40 mg total) by mouth daily with breakfast. Patient not taking: Reported on 07/31/2017 05/10/17   Rise Mu, PA-C    Family History Family History  Problem Relation Age of Onset  . Epilepsy Mother     Social History Social History   Tobacco Use  . Smoking status: Never Smoker  . Smokeless tobacco: Never Used  Substance Use Topics  . Alcohol use: No    Alcohol/week: 0.0 oz  . Drug use: No     Allergies   Cherry and Peach [prunus persica]   Review of Systems Review of Systems  10 Systems reviewed and are negative for acute change except as noted in the HPI.  Physical Exam Updated Vital Signs BP (!) 110/57  (BP Location: Right Arm)   Pulse 68   Temp 100.3 F (37.9 C) (Oral)   Resp 14   LMP 01/25/2018   SpO2 99%   Physical Exam  Constitutional: She is oriented to person, place, and time. She appears well-developed and well-nourished. No distress.  HENT:  Nose: Nose normal.  Mouth/Throat: Oropharynx is clear and moist.  Minor contusion center of forehead.  No abrasions or lacerations.  All skin surface intact.  Eyes: Pupils are equal, round, and reactive to light. EOM are normal.  Neck: Neck supple.  Cardiovascular: Normal rate, regular rhythm, normal heart sounds and intact distal pulses.  Pulmonary/Chest: Effort normal and breath sounds normal.  Abdominal: Soft. Bowel sounds are normal. She exhibits no distension.  Endorses mild discomfort diffusely in the lower abdomen to palpation.  No guarding or mass.  Genitourinary:  Genitourinary Comments: External vulva is closely shaved.  There are approximately 5 mm sized, nodular swollen papules along the junction of the squamus and mucosal skin.  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  Also with examination of the labia minora there is one ulcerative lesion several millimeters.  These are suspicious appearing for HSV.  Some possibility that these represent shaving folliculitis but the papules in the hairbearing area have what distinctly appears to be more of an ulceration than Davidian pustule. Mall amount of blood in the vaginal vault.  No clots.  No discharge from the cervix.  She had discomfort with insertion of digits for bimanual exam.  She also had discomfort with cervical motion and palpation of the uterus.  She was not exquisitely tender but reported it was more painful than typical with a pelvic exam.  Musculoskeletal: Normal range of motion. She exhibits no edema, tenderness or deformity.  Neurological: She is alert and oriented to person, place, and time. No cranial nerve deficit. She exhibits normal muscle tone.  Coordination normal.  Skin: Skin is warm and dry.  Psychiatric: She has a normal mood and affect.     ED Treatments / Results  Labs (all labs ordered are listed, but only abnormal results are displayed) Labs Reviewed  WET PREP, GENITAL - Abnormal; Notable for the following components:      Result Value   Clue Cells Wet Prep HPF POC PRESENT (*)    WBC, Wet Prep HPF POC MODERATE (*)    All other components within normal limits  COMPREHENSIVE METABOLIC PANEL - Abnormal; Notable for the following components:   Glucose, Bld 131 (*)  All other components within normal limits  CBC WITH DIFFERENTIAL/PLATELET - Abnormal; Notable for the following components:   Hemoglobin 11.1 (*)    MCH 24.4 (*)    All other components within normal limits  LIPASE, BLOOD  URINALYSIS, ROUTINE W REFLEX MICROSCOPIC  RPR  HSV(HERPES SIMPLEX VRS) I + II AB-IGG  HSV(HERPES SIMPLEX VRS) I + II AB-IGM  HIV ANTIBODY (ROUTINE TESTING)  I-STAT CG4 LACTIC ACID, ED  I-STAT BETA HCG BLOOD, ED ([facility], [facility], [facility] ONLY)  I-STAT CG4 LACTIC ACID, ED  GC/CHLAMYDIA PROBE AMP ([place]) NOT [facility]    EKG EKG Interpretation  Date/Time:  Sunday February 15 2018 11:42:12 EDT Ventricular Rate:  96 PR Interval:  140 QRS Duration: 86 QT Interval:  312 QTC Calculation: 394 R Axis:   90 Text Interpretation:  Normal sinus rhythm Rightward axis Nonspecific T wave abnormality Abnormal ECG nonspecific T wave flattening compared to previous Confirmed by Nachmen Mansel ([id number]) on 02/15/2018 12:07:11 PM   Radiology No results found.  Procedures Procedures (including critical care time)  Medications Ordered in ED Medications  cefTRIAXone (ROCEPHIN) injection 250 mg (has no administration in time range)  azithromycin (ZITHROMAX) tablet 1,000 mg (has no administration in time range)  acetaminophen (TYLENOL) tablet 650 mg (650 mg Oral Given 02/15/18 1150)     Initial Impression / Assessment and Plan / ED Course  I have  reviewed the triage vital signs and the nursing notes.  Pertinent labs & imaging results that were available during my care of the patient were reviewed by me and considered in my medical decision making (see chart for details).      Final Clinical Impressions(s) / ED Diagnoses   Final diagnoses:  Syncope and collapse  Abnormal vaginal bleeding  Vaginal lesion  Patient presents with several complaints.  One is for syncope.  He described almost 3 weeks of dysfunctional uterine bleeding.  Patient is hemoglobin is stable.  She has prior history of dysfunctional uterine bleeding.  At this time I most suspect orthostatic, vasovagal type syncope.  It occurs only when she transitions from sitting to standing or with micturition.  Clinically well in appearance with otherwise normal cardiac and pulmonary exam.  Tachycardia.  Hemoglobin stable. Patient described burning and swelling in the vulvar area.  PE is suggestive of HSV lesions.  Will be confirmed by serum testing.  Patient felt she was not at risk for HSV however she does have prior history of chlamydia.  Due to general pelvic tenderness and suspicion for HSV, she will be empirically treated for cervicitis with Rocephin and Zithromax.  She is clinically well in appearance and stable for discharge.  Is counseled to follow-up ASAP with her gynecologist and family doctor.  ED Discharge Orders        Ordered    acyclovir (ZOVIRAX) 400 MG tablet  3 times daily     07 /21/19 1431       Arby BarrettePfeiffer, Raykwon Hobbs, MD 02/15/18 1447

## 2018-02-15 NOTE — ED Notes (Signed)
[phone] Pt new number

## 2018-02-15 NOTE — ED Notes (Signed)
Pt completed orthostatic vital signs without complaints of distress or dizziness.

## 2018-02-16 LAB — GC/CHLAMYDIA PROBE AMP (~~LOC~~) NOT AT ARMC
CHLAMYDIA, DNA PROBE: NEGATIVE
NEISSERIA GONORRHEA: POSITIVE — AB

## 2018-02-16 LAB — RPR: RPR Ser Ql: NONREACTIVE

## 2018-02-17 LAB — HSV(HERPES SIMPLEX VRS) I + II AB-IGG

## 2018-02-17 LAB — HSV(HERPES SIMPLEX VRS) I + II AB-IGM: HSVI/II COMB AB IGM: 1.02 ratio — AB (ref 0.00–0.90)

## 2018-10-31 ENCOUNTER — Emergency Department (HOSPITAL_COMMUNITY)
Admission: EM | Admit: 2018-10-31 | Discharge: 2018-10-31 | Disposition: A | Discharge status: Home / Self Care | Payer: Self-pay | Attending: Emergency Medicine | Admitting: Emergency Medicine

## 2018-10-31 ENCOUNTER — Other Ambulatory Visit: Payer: Self-pay

## 2018-10-31 ENCOUNTER — Encounter (HOSPITAL_COMMUNITY): Payer: Self-pay | Admitting: Emergency Medicine

## 2018-10-31 ENCOUNTER — Emergency Department (HOSPITAL_COMMUNITY): Payer: Self-pay

## 2018-10-31 DIAGNOSIS — J4521 Mild intermittent asthma with (acute) exacerbation: Secondary | ICD-10-CM | POA: Insufficient documentation

## 2018-10-31 LAB — POC URINE PREG, ED: Preg Test, Ur: NEGATIVE

## 2018-10-31 MED ORDER — PREDNISONE 20 MG PO TABS: 40.0000 mg | ORAL_TABLET | Freq: Every day | ORAL | 0 refills | Status: AC

## 2018-10-31 MED ORDER — ALBUTEROL SULFATE HFA 108 (90 BASE) MCG/ACT IN AERS
INHALATION_SPRAY | RESPIRATORY_TRACT | Status: AC
  Administered 2018-10-31: 10:00:00
  Filled 2018-10-31: qty 6.7

## 2018-10-31 MED ORDER — ALBUTEROL SULFATE (2.5 MG/3ML) 0.083% IN NEBU: 2.5000 mg | INHALATION_SOLUTION | Freq: Four times a day (QID) | RESPIRATORY_TRACT | 12 refills | Status: DC | PRN

## 2018-10-31 NOTE — Progress Notes (Signed)
Pt's boyfriend, Virgina Jock. Cell: [phone].  Aware of no visitor policy.

## 2018-10-31 NOTE — ED Triage Notes (Signed)
Pt to ER for "asthma flare" reports woke up with chest tightness this morning and shortness of breath, reports seasonal allergies and hx of same around this time of year, reports used rescue inhaler this morning with "some relief." denies illness - fever, congestion, sore throat, etc. NAD.

## 2018-10-31 NOTE — ED Provider Notes (Signed)
Emergency Department Provider Note   I have reviewed the triage vital signs and the nursing notes.   HISTORY  Chief Complaint Asthma   HPI [name] Collier is a 23 y.o. female with PMH of asthma resents to the emergency department for evaluation of shortness of breath with wheezing chest tightness.  Patient reports that the symptoms are typical of her asthma exacerbations including the associated chest tightness.  She has had increased symptoms with the change of seasons and pollen exposure.  She is also wearing a mask at work which she states is causing some shortness of breath sensation.  Using albuterol inhaler prior to presenting to the emergency department and states that this improved her chest tightness significantly.  She denies any fevers, chills, body aches, COVID exposure.    Past Medical History:  Diagnosis Date  . Asthma   . Blood in stool     Patient Active Problem List   Diagnosis Date Noted  . PCOS (polycystic ovarian syndrome) 02/22/2015  . Vaginitis 07/11/2014  . Constipation 12/31/2013  . Blood in stool   . Anemia 07/01/2013  . Dysfunctional uterine bleeding 07/01/2013  . Belching 07/01/2013  . Lower abdominal pain 07/01/2013  . BMI (body mass index), pediatric, 85% to less than 95% for age 40/08/2012  . Vitamin D insufficiency 05/30/2013  . ECZEMA 06/02/2008    Past Surgical History:  Procedure Laterality Date  . TONSILLECTOMY    . TYMPANOSTOMY TUBE PLACEMENT      Allergies Cherry and Peach [prunus persica]  Family History  Problem Relation Age of Onset  . Epilepsy Mother     Social History Social History   Tobacco Use  . Smoking status: Never Smoker  . Smokeless tobacco: Never Used  Substance Use Topics  . Alcohol use: No    Alcohol/week: 0.0 standard drinks  . Drug use: No    Review of Systems  Constitutional: No fever/chills Eyes: No visual changes. ENT: No sore throat. Cardiovascular: Positive chest tightness.   Respiratory: Positive shortness of breath. Gastrointestinal: No abdominal pain.  No nausea, no vomiting.  No diarrhea.  No constipation. Genitourinary: Negative for dysuria. Musculoskeletal: Negative for back pain. Skin: Negative for rash. Neurological: Negative for headaches, focal weakness or numbness.  10-point ROS otherwise negative.  ____________________________________________   PHYSICAL EXAM:  VITAL SIGNS: ED Triage Vitals  Enc Vitals Group     BP 10/31/18 1007 130/66     Pulse Rate 10/31/18 1007 (!) 57     Resp 10/31/18 1007 18     Temp 10/31/18 1007 98.1 F (36.7 C)     Temp Source 10/31/18 1007 Oral     SpO2 10/31/18 1007 100 %     Weight 10/31/18 1007 170 lb (77.1 kg)     Height 10/31/18 1007 5\' 6"  (1.676 m)     Pain Score 10/31/18 1013 6   Constitutional: Alert and oriented. Well appearing and in no acute distress. Eyes: Conjunctivae are normal.  Head: Atraumatic. Nose: No congestion/rhinnorhea. Mouth/Throat: Mucous membranes are moist.  Neck: No stridor.   Cardiovascular: Normal rate, regular rhythm. Good peripheral circulation. Grossly normal heart sounds.   Respiratory: Normal respiratory effort.  No retractions. Lungs CTAB. Gastrointestinal: Soft and nontender. No distention.  Musculoskeletal: No lower extremity tenderness nor edema. No gross deformities of extremities. Neurologic:  Normal speech and language. No gross focal neurologic deficits are appreciated.  Skin:  Skin is warm, dry and intact. No rash noted.  ____________________________________________   LABS (all  labs ordered are listed, but only abnormal results are displayed)  Labs Reviewed  POC URINE PREG, ED   ____________________________________________  RADIOLOGY  Dg Chest Portable 1 View  Result Date: 10/31/2018 CLINICAL DATA:  Shortness of breath EXAM: PORTABLE CHEST 1 VIEW COMPARISON:  05/09/2017 FINDINGS: Normal heart size and mediastinal contours. No acute infiltrate or edema.  No effusion or pneumothorax. No acute osseous findings. IMPRESSION: Negative chest Electronically Signed   By: Marnee Spring M.D.   On: 10/31/2018 11:07    ____________________________________________   PROCEDURES  Procedure(s) performed:   Procedures  None  ____________________________________________   INITIAL IMPRESSION / ASSESSMENT AND PLAN / ED COURSE  Pertinent labs & imaging results that were available during my care of the patient were reviewed by me and considered in my medical decision making (see chart for details).   Patient presents to the emergency department for evaluation of shortness of breath and chest tightness improved with her inhaler at home.  Reports that symptoms are typical of her asthma flares in the past.  My suspicion for COVID is very low.  Normal respiratory rate and no wheezing on exam.  Very low suspicion for PE. Patient is PERC negative and low risk by [place]. Plan for Albuterol inh here, CXR, and pregnancy test.   11:17 AM  CXR negative. Pregnancy negative. Chest pressure resolved with inh. Wrote Rx with albuterol neb and steroid burst.    [name] Collier was evaluated in Emergency Department on 10/31/2018 for the symptoms described in the history of present illness. She was evaluated in the context of the global COVID-19 pandemic, which necessitated consideration that the patient might be at risk for infection with the SARS-CoV-2 virus that causes COVID-19. Institutional protocols and algorithms that pertain to the evaluation of patients at risk for COVID-19 are in a state of rapid change based on information released by regulatory bodies including the CDC and federal and state organizations. These policies and algorithms were followed during the patient's care in the ED.  ____________________________________________  FINAL CLINICAL IMPRESSION(S) / ED DIAGNOSES  Final diagnoses:  Mild intermittent asthma with exacerbation     MEDICATIONS GIVEN  DURING THIS VISIT:  Medications  albuterol (PROVENTIL HFA;VENTOLIN HFA) 108 (90 Base) MCG/ACT inhaler (  Given 10/31/18 1024)     NEW OUTPATIENT MEDICATIONS STARTED DURING THIS VISIT:  New Prescriptions   ALBUTEROL (PROVENTIL) (2.5 MG/3ML) 0.083% NEBULIZER SOLUTION    Take 3 mLs (2.5 mg total) by nebulization every 6 (six) hours as needed for wheezing or shortness of breath.   PREDNISONE (DELTASONE) 20 MG TABLET    Take 2 tablets (40 mg total) by mouth daily for 5 days.    Note:  This document was prepared using Dragon voice recognition software and may include unintentional dictation errors.  Alona Bene, MD Emergency Medicine    , Arlyss Repress, MD 10/31/18 [phone]

## 2018-10-31 NOTE — Discharge Instructions (Signed)
We believe that your symptoms are caused today by an exacerbation of your asthma.  Please take the prescribed medications and any medications that you have at home.  Follow up with your doctor as recommended.  If you develop any new or worsening symptoms, including but not limited to fever, persistent vomiting, worsening shortness of breath, or other symptoms that concern you, please return to the Emergency Department immediately. ° ° °Asthma °Asthma is a recurring condition in which the airways tighten and narrow. Asthma can make it difficult to breathe. It can cause coughing, wheezing, and shortness of breath. Asthma episodes, also called asthma attacks, range from minor to life-threatening. Asthma cannot be cured, but medicines and lifestyle changes can help control it. °CAUSES °Asthma is believed to be caused by inherited (genetic) and environmental factors, but its exact cause is unknown. Asthma may be triggered by allergens, lung infections, or irritants in the air. Asthma triggers are different for each person. Common triggers include:  °Animal dander. °Dust mites. °Cockroaches. °Pollen from trees or grass. °Mold. °Smoke. °Air pollutants such as dust, household cleaners, hair sprays, aerosol sprays, paint fumes, strong chemicals, or strong odors. °Cold air, weather changes, and winds (which increase molds and pollens in the air). °Strong emotional expressions such as crying or laughing hard. °Stress. °Certain medicines (such as aspirin) or types of drugs (such as beta-blockers). °Sulfites in foods and drinks. Foods and drinks that may contain sulfites include dried fruit, potato chips, and sparkling grape juice. °Infections or inflammatory conditions such as the flu, a cold, or an inflammation of the nasal membranes (rhinitis). °Gastroesophageal reflux disease (GERD). °Exercise or strenuous activity. °SYMPTOMS °Symptoms may occur immediately after asthma is triggered or many hours later. Symptoms  include: °Wheezing. °Excessive nighttime or early morning coughing. °Frequent or severe coughing with a common cold. °Chest tightness. °Shortness of breath. °DIAGNOSIS  °The diagnosis of asthma is made by a review of your medical history and a physical exam. Tests may also be performed. These may include: °Lung function studies. These tests show how much air you breathe in and out. °Allergy tests. °Imaging tests such as X-rays. °TREATMENT  °Asthma cannot be cured, but it can usually be controlled. Treatment involves identifying and avoiding your asthma triggers. It also involves medicines. There are 2 classes of medicine used for asthma treatment:  °Controller medicines. These prevent asthma symptoms from occurring. They are usually taken every day. °Reliever or rescue medicines. These quickly relieve asthma symptoms. They are used as needed and provide short-term relief. °Your health care provider will help you create an asthma action plan. An asthma action plan is a written plan for managing and treating your asthma attacks. It includes a list of your asthma triggers and how they may be avoided. It also includes information on when medicines should be taken and when their dosage should be changed. An action plan may also involve the use of a device called a peak flow meter. A peak flow meter measures how well the lungs are working. It helps you monitor your condition. °HOME CARE INSTRUCTIONS  °Take medicines only as directed by your health care provider. Speak with your health care provider if you have questions about how or when to take the medicines. °Use a peak flow meter as directed by your health care provider. Record and keep track of readings. °Understand and use the action plan to help minimize or stop an asthma attack without needing to seek medical care. °Control your home environment in the following   ways to help prevent asthma attacks: °Do not smoke. Avoid being exposed to secondhand smoke. °Change  your heating and air conditioning filter regularly. °Limit your use of fireplaces and wood stoves. °Get rid of pests (such as roaches and mice) and their droppings. °Throw away plants if you see mold on them. °Clean your floors and dust regularly. Use unscented cleaning products. °Try to have someone else vacuum for you regularly. Stay out of rooms while they are being vacuumed and for a short while afterward. If you vacuum, use a dust mask from a hardware store, a double-layered or microfilter vacuum cleaner bag, or a vacuum cleaner with a HEPA filter. °Replace carpet with wood, tile, or vinyl flooring. Carpet can trap dander and dust. °Use allergy-proof pillows, mattress covers, and box spring covers. °Wash bed sheets and blankets every week in hot water and dry them in a dryer. °Use blankets that are made of polyester or cotton. °Clean bathrooms and kitchens with bleach. If possible, have someone repaint the walls in these rooms with mold-resistant paint. Keep out of the rooms that are being cleaned and painted. °Wash hands frequently. °SEEK MEDICAL CARE IF:  °You have wheezing, shortness of breath, or a cough even if taking medicine to prevent attacks. °The colored mucus you cough up (sputum) is thicker than usual. °Your sputum changes from clear or white to yellow, green, gray, or bloody. °You have any problems that may be related to the medicines you are taking (such as a rash, itching, swelling, or trouble breathing). °You are using a reliever medicine more than 2-3 times per week. °Your peak flow is still at 50-79% of your personal best after following your action plan for 1 hour. °You have a fever. °SEEK IMMEDIATE MEDICAL CARE IF:  °You seem to be getting worse and are unresponsive to treatment during an asthma attack. °You are short of breath even at rest. °You get short of breath when doing very little physical activity. °You have difficulty eating, drinking, or talking due to asthma symptoms. °You  develop chest pain. °You develop a fast heartbeat. °You have a bluish color to your lips or fingernails. °You are light-headed, dizzy, or faint. °Your peak flow is less than 50% of your personal best. °MAKE SURE YOU:  °Understand these instructions. °Will watch your condition. °Will get help right away if you are not doing well or get worse. °Document Released: 07/15/2005 Document Revised: 11/29/2013 Document Reviewed: 02/11/2013 °ExitCare® Patient Information ©2015 [employer]. This information is not intended to replace advice given to you by your health care provider. Make sure you discuss any questions you have with your health care provider. ° °How to Use a Nebulizer °If you have asthma or other breathing problems, you might need to breathe in (inhale) medicine. This can be done with a nebulizer. A nebulizer is a device that turns liquid medicine into a mist that you can inhale.  °There are different kinds of nebulizers. Most are Maggio. With some, you breathe in through a mouthpiece. With others, a mask fits over your nose and mouth. Most nebulizers must be connected to a [place] air compressor. Air is forced through tubing from the compressor to the nebulizer. The forced air changes the liquid into a fine spray. °RISKS AND COMPLICATIONS °The nebulizer must work properly for it to help your breathing. If the nebulizer does not produce mist, or if foam comes out, this indicates that the nebulizer is not working properly. Sometimes a filter can get clogged, or   there might be a problem with the air compressor. Check the instruction booklet that came with your nebulizer. It should tell you how to fix problems or where to call for help. You should have at least one extra nebulizer at home. That way, you will always have one when you need it.  °HOW TO PREPARE BEFORE USING THE NEBULIZER °Take these steps before using the nebulizer: °Check your medicine. Make sure it has not expired and is not damaged in any way.    °Wash your hands with soap and water.   °Put all the parts of your nebulizer on a sturdy, flat surface. Make sure the tubing connects the compressor and the nebulizer. °Measure the liquid medicine according to your health care provider's instructions. Pour it into the nebulizer. °Attach the mouthpiece or mask.   °Test the nebulizer by turning it on to make sure a spray is coming out. Then, turn it off.   °HOW TO USE THE NEBULIZER °Sit down and focus on staying relaxed.   °If your nebulizer has a mask, put it over your nose and mouth. If you use a mouthpiece, put it in your mouth. Press your lips firmly around the mouthpiece. °Turn on the nebulizer.   °Breathe out.   °Some nebulizers have a finger valve. If yours does, cover up the air hole so the air gets to the nebulizer. °Once the medicine begins to mist out, take slow, deep breaths. If there is a finger valve, release it at the end of your breath. °Continue taking slow, deep breaths until the nebulizer is empty.   °Be sure to stop the machine at any point if you start coughing or if the medicine foams or bubbles. °HOW TO CLEAN THE NEBULIZER  °The nebulizer and all its parts must be kept very clean. Follow the manufacturer's instructions for cleaning. For most nebulizers, you should follow these guidelines: °Wash the nebulizer after each use. Use warm water and soap. Rinse it well. Shake the nebulizer to remove extra water. Put it on a clean towel until it is completely dry. To make sure it is dry, put the nebulizer back together. Turn on the compressor for a few minutes. This will blow air through the nebulizer.   °Do not wash the tubing or the finger valve.   °Store the nebulizer in a dust-free place.   °Inspect the filter every week. Replace it any time it looks dirty.   °Sometimes the nebulizer will need a more complete cleaning. The instruction booklet should say how often you need to do this. °SEEK MEDICAL CARE IF:  °You continue to have difficulty  breathing.   °You have trouble using the nebulizer.   °Document Released: 07/03/2009 Document Revised: 11/29/2013 Document Reviewed: 01/04/2013 °ExitCare® Patient Information ©2015 [employer]. This information is not intended to replace advice given to you by your health care provider. Make sure you discuss any questions you have with your health care provider. ° °How to Use an Inhaler °Proper inhaler technique is very important. Good technique ensures that the medicine reaches the lungs. Poor technique results in depositing the medicine on the tongue and back of the throat rather than in the airways. If you do not use the inhaler with good technique, the medicine will not help you. °STEPS TO FOLLOW IF USING AN INHALER WITHOUT AN EXTENSION TUBE °Remove the cap from the inhaler. °If you are using the inhaler for the first time, you will need to prime it. Shake the inhaler for   5 seconds and release four puffs into the air, away from your face. Ask your health care provider or pharmacist if you have questions about priming your inhaler. °Shake the inhaler for 5 seconds before each breath in (inhalation). °Position the inhaler so that the top of the canister faces up. °Put your index finger on the top of the medicine canister. Your thumb supports the bottom of the inhaler. °Open your mouth. °Either place the inhaler between your teeth and place your lips tightly around the mouthpiece, or hold the inhaler 1-2 inches away from your open mouth. If you are unsure of which technique to use, ask your health care provider. °Breathe out (exhale) normally and as completely as possible. °Press the canister down with your index finger to release the medicine. °At the same time as the canister is pressed, inhale deeply and slowly until your lungs are completely filled. This should take 4-6 seconds. Keep your tongue down. °Hold the medicine in your lungs for 5-10 seconds (10 seconds is best). This helps the medicine get into the  [place] airways of your lungs. °Breathe out slowly, through pursed lips. Whistling is an example of pursed lips. °Wait at least 15-30 seconds between puffs. Continue with the above steps until you have taken the number of puffs your health care provider has ordered. Do not use the inhaler more than your health care provider tells you. °Replace the cap on the inhaler. °Follow the directions from your health care provider or the inhaler insert for cleaning the inhaler. °STEPS TO FOLLOW IF USING AN INHALER WITH AN EXTENSION (SPACER) °Remove the cap from the inhaler. °If you are using the inhaler for the first time, you will need to prime it. Shake the inhaler for 5 seconds and release four puffs into the air, away from your face. Ask your health care provider or pharmacist if you have questions about priming your inhaler. °Shake the inhaler for 5 seconds before each breath in (inhalation). °Place the open end of the spacer onto the mouthpiece of the inhaler. °Position the inhaler so that the top of the canister faces up and the spacer mouthpiece faces you. °Put your index finger on the top of the medicine canister. Your thumb supports the bottom of the inhaler and the spacer. °Breathe out (exhale) normally and as completely as possible. °Immediately after exhaling, place the spacer between your teeth and into your mouth. Close your lips tightly around the spacer. °Press the canister down with your index finger to release the medicine. °At the same time as the canister is pressed, inhale deeply and slowly until your lungs are completely filled. This should take 4-6 seconds. Keep your tongue down and out of the way. °Hold the medicine in your lungs for 5-10 seconds (10 seconds is best). This helps the medicine get into the [place] airways of your lungs. Exhale. °Repeat inhaling deeply through the spacer mouthpiece. Again hold that breath for up to 10 seconds (10 seconds is best). Exhale slowly. If it is difficult to take  this second deep breath through the spacer, breathe normally several times through the spacer. Remove the spacer from your mouth. °Wait at least 15-30 seconds between puffs. Continue with the above steps until you have taken the number of puffs your health care provider has ordered. Do not use the inhaler more than your health care provider tells you. °Remove the spacer from the inhaler, and place the cap on the inhaler. °Follow the directions from your health care provider   or the inhaler insert for cleaning the inhaler and spacer. °If you are using different kinds of inhalers, use your quick relief medicine to open the airways 10-15 minutes before using a steroid if instructed to do so by your health care provider. If you are unsure which inhalers to use and the order of using them, ask your health care provider, nurse, or respiratory therapist. °If you are using a steroid inhaler, always rinse your mouth with water after your last puff, then gargle and spit out the water. Do not swallow the water. °AVOID: °Inhaling before or after starting the spray of medicine. It takes practice to coordinate your breathing with triggering the spray. °Inhaling through the nose (rather than the mouth) when triggering the spray. °HOW TO DETERMINE IF YOUR INHALER IS FULL OR NEARLY EMPTY °You cannot know when an inhaler is empty by shaking it. A few inhalers are now being made with dose counters. Ask your health care provider for a prescription that has a dose counter if you feel you need that extra help. If your inhaler does not have a counter, ask your health care provider to help you determine the date you need to refill your inhaler. Write the refill date on a calendar or your inhaler canister. Refill your inhaler 7-10 days before it runs out. Be sure to keep an adequate supply of medicine. This includes making sure it is not expired, and that you have a spare inhaler.  °SEEK MEDICAL CARE IF:  °Your symptoms are only partially  relieved with your inhaler. °You are having trouble using your inhaler. °You have some increase in phlegm. °SEEK IMMEDIATE MEDICAL CARE IF:  °You feel little or no relief with your inhalers. You are still wheezing and are feeling shortness of breath or tightness in your chest or both. °You have dizziness, headaches, or a fast heart rate. °You have chills, fever, or night sweats. °You have a noticeable increase in phlegm production, or there is blood in the phlegm. °MAKE SURE YOU:  °Understand these instructions. °Will watch your condition. °Will get help right away if you are not doing well or get worse. °Document Released: 07/12/2000 Document Revised: 05/05/2013 Document Reviewed: 02/11/2013 °Exit[employer]® Patient Information ©2015 [employer]. This information is not intended to replace advice given to you by your health care provider. Make sure you discuss any questions you have with your health care provider. ° ° ° °

## 2019-04-08 ENCOUNTER — Emergency Department (HOSPITAL_COMMUNITY): Payer: Medicaid Other

## 2019-04-08 ENCOUNTER — Emergency Department (HOSPITAL_COMMUNITY)
Admission: EM | Admit: 2019-04-08 | Discharge: 2019-04-08 | Disposition: A | Discharge status: Home / Self Care | Payer: Medicaid Other | Attending: Emergency Medicine | Admitting: Emergency Medicine

## 2019-04-08 ENCOUNTER — Other Ambulatory Visit: Payer: Self-pay

## 2019-04-08 DIAGNOSIS — W25XXXA Contact with sharp glass, initial encounter: Secondary | ICD-10-CM | POA: Insufficient documentation

## 2019-04-08 DIAGNOSIS — Z23 Encounter for immunization: Secondary | ICD-10-CM | POA: Insufficient documentation

## 2019-04-08 DIAGNOSIS — Y999 Unspecified external cause status: Secondary | ICD-10-CM | POA: Insufficient documentation

## 2019-04-08 DIAGNOSIS — Y9389 Activity, other specified: Secondary | ICD-10-CM | POA: Insufficient documentation

## 2019-04-08 DIAGNOSIS — Z79899 Other long term (current) drug therapy: Secondary | ICD-10-CM | POA: Insufficient documentation

## 2019-04-08 DIAGNOSIS — S41111A Laceration without foreign body of right upper arm, initial encounter: Secondary | ICD-10-CM

## 2019-04-08 DIAGNOSIS — Y929 Unspecified place or not applicable: Secondary | ICD-10-CM | POA: Insufficient documentation

## 2019-04-08 DIAGNOSIS — S51811A Laceration without foreign body of right forearm, initial encounter: Secondary | ICD-10-CM | POA: Insufficient documentation

## 2019-04-08 MED ORDER — HYDROMORPHONE HCL 1 MG/ML IJ SOLN
0.5000 mg | Freq: Once | INTRAMUSCULAR | Status: AC
  Administered 2019-04-08: 0.5 mg via INTRAVENOUS
  Filled 2019-04-08: qty 1

## 2019-04-08 MED ORDER — PROMETHAZINE HCL 25 MG/ML IJ SOLN
12.5000 mg | Freq: Once | INTRAMUSCULAR | Status: AC
  Administered 2019-04-08: 12.5 mg via INTRAVENOUS
  Filled 2019-04-08: qty 1

## 2019-04-08 MED ORDER — DOXYCYCLINE HYCLATE 100 MG PO TABS
100.0000 mg | ORAL_TABLET | Freq: Once | ORAL | Status: AC
  Administered 2019-04-08: 100 mg via ORAL
  Filled 2019-04-08: qty 1

## 2019-04-08 MED ORDER — ONDANSETRON 4 MG PO TBDP
4.0000 mg | ORAL_TABLET | Freq: Once | ORAL | Status: AC
  Administered 2019-04-08: 4 mg via ORAL
  Filled 2019-04-08: qty 1

## 2019-04-08 MED ORDER — LIDOCAINE-EPINEPHRINE (PF) 2 %-1:200000 IJ SOLN
20.0000 mL | Freq: Once | INTRAMUSCULAR | Status: AC
  Administered 2019-04-08: 20 mL
  Filled 2019-04-08: qty 20

## 2019-04-08 MED ORDER — DOXYCYCLINE HYCLATE 100 MG PO CAPS: 100.0000 mg | ORAL_CAPSULE | Freq: Two times a day (BID) | ORAL | 0 refills | Status: DC

## 2019-04-08 MED ORDER — TETANUS-DIPHTH-ACELL PERTUSSIS 5-2.5-18.5 LF-MCG/0.5 IM SUSP
0.5000 mL | Freq: Once | INTRAMUSCULAR | Status: AC
  Administered 2019-04-08: 0.5 mL via INTRAMUSCULAR
  Filled 2019-04-08: qty 0.5

## 2019-04-08 NOTE — ED Provider Notes (Signed)
[facility] Provider Note   CSN: [id number] Arrival date & time: 04/08/19  1641     History   Chief Complaint Chief Complaint  Patient presents with  . Arm Injury    HPI [name] Collier is a 23 y.o. female.     HPI    23 year old female presents today with complaints of laceration to her right arm.  She is right-hand dominant.  She notes her last p.o. intake was around 10 AM this morning having [employer].  She reports that she was knocking on the window of a car when the car window broke causing her hand to fall into the glass and a laceration along the ulnar medial forearm.  She notes severe pain, bleeding, numbness in her hand and inability to range her wrist or hand.   Past Medical History:  Diagnosis Date  . Asthma   . Blood in stool     Patient Active Problem List   Diagnosis Date Noted  . PCOS (polycystic ovarian syndrome) 02/22/2015  . Vaginitis 07/11/2014  . Constipation 12/31/2013  . Blood in stool   . Anemia 07/01/2013  . Dysfunctional uterine bleeding 07/01/2013  . Belching 07/01/2013  . Lower abdominal pain 07/01/2013  . BMI (body mass index), pediatric, 85% to less than 95% for age 77/08/2012  . Vitamin D insufficiency 05/30/2013  . ECZEMA 06/02/2008    Past Surgical History:  Procedure Laterality Date  . TONSILLECTOMY    . TYMPANOSTOMY TUBE PLACEMENT       OB History    Gravida  0   Para  0   Term  0   Preterm  0   AB  0   Living  0     SAB  0   TAB  0   Ectopic  0   Multiple  0   Live Births               Home Medications    Prior to Admission medications   Medication Sig Start Date End Date Taking? Authorizing Provider  Acetaminophen (ARTHRITIS PAIN RELIEF PO) Take 2 tablets by mouth as needed (pain).   Yes [provider]  albuterol (PROVENTIL) (2.5 MG/3ML) 0.083% nebulizer solution Take 3 mLs (2.5 mg total) by nebulization every 6 (six) hours as needed for  wheezing or shortness of breath. 10/31/18  Yes Long, Arlyss RepressJoshua G, MD  acyclovir (ZOVIRAX) 400 MG tablet Take 1 tablet (400 mg total) by mouth 3 (three) times daily. Patient not taking: Reported on 04/08/2019 02/15/18   Arby BarrettePfeiffer, Marcy, MD  benzonatate (TESSALON) 100 MG capsule Take 1 capsule (100 mg total) by mouth every 8 (eight) hours. Patient not taking: Reported on 07/31/2017 05/10/17   Demetrios LollLeaphart, Kenneth T, PA-C  doxycycline (VIBRAMYCIN) 100 MG capsule Take 1 capsule (100 mg total) by mouth 2 (two) times daily. 04/08/19   Jamichael Knotts, Tinnie GensJeffrey, PA-C  ibuprofen (ADVIL,MOTRIN) 600 MG tablet Take 1 tablet (600 mg total) by mouth every 6 (six) hours as needed. Patient not taking: Reported on 01/22/2017 09/24/16   Elpidio AnisUpstill, Shari, PA-C  metroNIDAZOLE (FLAGYL) 500 MG tablet Take 1 tablet (500 mg total) by mouth 2 (two) times daily. Patient not taking: Reported on 04/08/2019 08/04/17   Reva BoresPratt, Tanya S, MD  naproxen (NAPROSYN) 500 MG tablet Take 1 tablet (500 mg total) by mouth 2 (two) times daily with a meal. Patient not taking: Reported on 01/22/2017 06/28/16   Sharin MonsYoung, Michelle G, PA-C    Family History  Family History  Problem Relation Age of Onset  . Epilepsy Mother     Social History Social History   Tobacco Use  . Smoking status: Never Smoker  . Smokeless tobacco: Never Used  Substance Use Topics  . Alcohol use: No    Alcohol/week: 0.0 standard drinks  . Drug use: No     Allergies   Cherry, Peach [prunus persica], and Almond (diagnostic)   Review of Systems Review of Systems  All other systems reviewed and are negative.  Physical Exam Updated Vital Signs BP 120/73   Pulse 77   Temp 98.8 F (37.1 C) (Oral)   Resp (!) 30   LMP 03/27/2019 (Exact Date)   SpO2 100%   Physical Exam Vitals signs and nursing note reviewed.  Constitutional:      Appearance: She is well-developed.  HENT:     Head: Normocephalic and atraumatic.  Eyes:     General: No scleral icterus.       Right eye: No  discharge.        Left eye: No discharge.     Conjunctiva/sclera: Conjunctivae normal.     Pupils: Pupils are equal, round, and reactive to light.  Neck:     Musculoskeletal: Normal range of motion.     Vascular: No JVD.     Trachea: No tracheal deviation.  Pulmonary:     Effort: Pulmonary effort is normal.     Breath sounds: No stridor.  Musculoskeletal:     Comments: 8 cm laceration as noted in the photo along the ulnar medial forearm with muscular damage-difficult exam secondary to patient's discomfort-no obvious foreign bodies  Patient unable to range the wrist in any motion unable to flex or extend the fingers, forced wrist range of motion and movement of the third through fifth digits causes pain at the forearm-globally decreased sensation to the hand  Neurological:     Mental Status: She is alert and oriented to person, place, and time.     Coordination: Coordination normal.  Psychiatric:        Behavior: Behavior normal.        Thought Content: Thought content normal.        Judgment: Judgment normal.          ED Treatments / Results  Labs (all labs ordered are listed, but only abnormal results are displayed) Labs Reviewed - No data to display  EKG None  Radiology Dg Forearm Right  Result Date: 04/08/2019 CLINICAL DATA:  Laceration to right forearm, punch through window EXAM: RIGHT FOREARM - 2 VIEW COMPARISON:  None FINDINGS: There is extensive soft tissue laceration and subcutaneous gas along the posterior and medial aspects of the right forearm. No radiopaque foreign body. No subjacent osseous injury. Overlying bandaging material is present. IMPRESSION: Soft tissue laceration along the posterior and medial aspects of the right forearm without subjacent osseous injury or radiopaque foreign body. Electronically Signed   By: Kreg ShropshirePrice  DeHay M.D.   On: 04/08/2019 18:04    Procedures .Marland Kitchen.Laceration Repair  Date/Time: 04/10/2019 10:29 AM Performed by: Eyvonne MechanicHedges, Cope Marte,  PA-C Authorized by: Eyvonne MechanicHedges, Carilyn Woolston, PA-C   Consent:    Consent obtained:  Verbal   Consent given by:  Patient   Risks discussed:  Infection, need for additional repair, nerve damage, pain, poor cosmetic result, poor wound healing and retained foreign body   Alternatives discussed:  No treatment and delayed treatment Anesthesia (see MAR for exact dosages):    Anesthesia method:  Local infiltration  Local anesthetic:  Lidocaine 2% WITH epi Laceration details:    Location: right forearm.   Length (cm):  8 Repair type:    Repair type:  Simple Pre-procedure details:    Preparation:  Patient was prepped and draped in usual sterile fashion and imaging obtained to evaluate for foreign bodies Exploration:    Hemostasis achieved with:  Direct pressure   Wound exploration: wound explored through full range of motion and entire depth of wound probed and visualized     Wound extent: fascia violated, muscle damage and nerve damage     Wound extent: no foreign bodies/material noted, no tendon damage noted, no underlying fracture noted and no vascular damage noted     Contaminated: no   Treatment:    Area cleansed with:  Betadine and saline   Amount of cleaning:  Extensive   Irrigation solution:  Sterile water   Irrigation volume:  1 liter   Irrigation method:  Syringe   Visualized foreign bodies/material removed: no   Skin repair:    Repair method:  Sutures   Suture size:  3-0   Suture material:  Prolene   Suture technique:  Simple interrupted   Number of sutures:  14 Post-procedure details:    Dressing:  Antibiotic ointment, splint for protection and non-adherent dressing   Patient tolerance of procedure:  Tolerated well, no immediate complications   (including critical care time)  Medications Ordered in ED Medications  HYDROmorphone (DILAUDID) injection 0.5 mg (0.5 mg Intravenous Given 04/08/19 1735)  Tdap (BOOSTRIX) injection 0.5 mL (0.5 mLs Intramuscular Given 04/08/19 1736)   HYDROmorphone (DILAUDID) injection 0.5 mg (0.5 mg Intravenous Given 04/08/19 1803)  lidocaine-EPINEPHrine (XYLOCAINE W/EPI) 2 %-1:200000 (PF) injection 20 mL (20 mLs Infiltration Given 04/08/19 1839)  ondansetron (ZOFRAN-ODT) disintegrating tablet 4 mg (4 mg Oral Given 04/08/19 1840)  HYDROmorphone (DILAUDID) injection 0.5 mg (0.5 mg Intravenous Given 04/08/19 1852)  doxycycline (VIBRA-TABS) tablet 100 mg (100 mg Oral Given 04/08/19 2037)  promethazine (PHENERGAN) injection 12.5 mg (12.5 mg Intravenous Given 04/08/19 2107)     Initial Impression / Assessment and Plan / ED Course  I have reviewed the triage vital signs and the nursing notes.  Pertinent labs & imaging results that were available during my care of the patient were reviewed by me and considered in my medical decision making (see chart for details).        23 year old female presents today with laceration to her right forearm.  This appears to be a deep laceration.  After pain medicine and anesthetics patient had significant improvement in her discomfort.  She had difficulty ranging the digits 5 4 and 3 with slight decrease sensation but intact sensation in those fingers.  Given the location of the injury low suspicion for any true tendon pathology, question nerve involvement although her sensory function had improved from her initial evaluation.  I discussed the case with on-call surgeon Dr. Jeannie Fend, the patient will be closed here and he will follow closely as an outpatient on Tuesday in his office.  I discussed the plan with the patient who agreed, wound was very thoroughly cleansed and irrigated and repaired in sterile fashion.  Patient will have splinting placed, she will follow-up closely with Dr. Jeannie Fend, and return immediately if she develops any new or worsening signs or symptoms.  Verbalized understanding and agreement to this plan had no further questions concerns at time of discharge.  Final Clinical Impressions(s) / ED  Diagnoses   Final diagnoses:  Laceration of  right upper extremity, initial encounter    ED Discharge Orders         Ordered    doxycycline (VIBRAMYCIN) 100 MG capsule  2 times daily     04/08/19 2228           Eyvonne Mechanic, PA-C 04/10/19 1037    Charlynne Pander, MD 04/13/19 [phone]

## 2019-04-08 NOTE — Discharge Instructions (Signed)
Please read the attached information.  Please follow-up with Dr. Linford Arnold office tomorrow to schedule evaluation on Tuesday.  If you develop any new or worsening signs or symptoms return immediately to the emergency room

## 2019-04-08 NOTE — ED Notes (Signed)
Patient verbalizes understanding of discharge instructions. Opportunity for questioning and answers were provided. Armband removed by staff, pt discharged from ED ambulatory.   

## 2019-04-08 NOTE — ED Triage Notes (Signed)
Patient here with large laceration to R forearm after she punched a window at a motel. Underlying tissue and muscle visible. CSM intact distally. Pressure bandage applied in triage. Patient states she feels like there is glass in it.

## 2019-08-30 ENCOUNTER — Emergency Department (HOSPITAL_COMMUNITY)
Admission: EM | Admit: 2019-08-30 | Discharge: 2019-08-30 | Disposition: A | Discharge status: Home / Self Care | Payer: Medicaid Other | Attending: Emergency Medicine | Admitting: Emergency Medicine

## 2019-08-30 ENCOUNTER — Encounter (HOSPITAL_COMMUNITY): Payer: Self-pay

## 2019-08-30 ENCOUNTER — Other Ambulatory Visit: Payer: Self-pay

## 2019-08-30 DIAGNOSIS — N3000 Acute cystitis without hematuria: Secondary | ICD-10-CM | POA: Insufficient documentation

## 2019-08-30 DIAGNOSIS — R1030 Lower abdominal pain, unspecified: Secondary | ICD-10-CM

## 2019-08-30 DIAGNOSIS — M25519 Pain in unspecified shoulder: Secondary | ICD-10-CM | POA: Insufficient documentation

## 2019-08-30 DIAGNOSIS — J45909 Unspecified asthma, uncomplicated: Secondary | ICD-10-CM | POA: Insufficient documentation

## 2019-08-30 LAB — URINALYSIS, ROUTINE W REFLEX MICROSCOPIC
Bilirubin Urine: NEGATIVE
Glucose, UA: NEGATIVE mg/dL
Hgb urine dipstick: NEGATIVE
Ketones, ur: NEGATIVE mg/dL
Nitrite: NEGATIVE
Protein, ur: 30 mg/dL — AB
Specific Gravity, Urine: 1.027 (ref 1.005–1.030)
WBC, UA: >50 WBC/hpf — ABNORMAL HIGH (ref 0–5)
pH: 6 (ref 5.0–8.0)

## 2019-08-30 LAB — CBC
HCT: 41.1 % (ref 36.0–46.0)
Hemoglobin: 13.1 g/dL (ref 12.0–15.0)
MCH: 25.6 pg — ABNORMAL LOW (ref 26.0–34.0)
MCHC: 31.9 g/dL (ref 30.0–36.0)
MCV: 80.3 fL (ref 80.0–100.0)
Platelets: 215 10*3/uL (ref 150–400)
RBC: 5.12 MIL/uL — ABNORMAL HIGH (ref 3.87–5.11)
RDW: 13.9 % (ref 11.5–15.5)
WBC: 3.2 10*3/uL — ABNORMAL LOW (ref 4.0–10.5)
nRBC: 0 % (ref 0.0–0.2)

## 2019-08-30 LAB — COMPREHENSIVE METABOLIC PANEL
ALT: 12 U/L (ref 0–44)
AST: 19 U/L (ref 15–41)
Albumin: 4.5 g/dL (ref 3.5–5.0)
Alkaline Phosphatase: 54 U/L (ref 38–126)
Anion gap: 12 (ref 5–15)
BUN: 10 mg/dL (ref 6–20)
CO2: 22 mmol/L (ref 22–32)
Calcium: 9.6 mg/dL (ref 8.9–10.3)
Chloride: 104 mmol/L (ref 98–111)
Creatinine, Ser: 0.81 mg/dL (ref 0.44–1.00)
GFR calc Af Amer: >60 mL/min (ref >60)
GFR calc non Af Amer: >60 mL/min (ref >60)
Glucose, Bld: 93 mg/dL (ref 70–99)
Potassium: 3.8 mmol/L (ref 3.5–5.1)
Sodium: 138 mmol/L (ref 135–145)
Total Bilirubin: 0.3 mg/dL (ref 0.3–1.2)
Total Protein: 8.2 g/dL — ABNORMAL HIGH (ref 6.5–8.1)

## 2019-08-30 LAB — I-STAT BETA HCG BLOOD, ED (MC, WL, AP ONLY): I-stat hCG, quantitative: <5 m[IU]/mL (ref <5)

## 2019-08-30 LAB — LIPASE, BLOOD: Lipase: 18 U/L (ref 11–51)

## 2019-08-30 MED ORDER — NITROFURANTOIN MONOHYD MACRO 100 MG PO CAPS: 100.0000 mg | ORAL_CAPSULE | Freq: Two times a day (BID) | ORAL | 0 refills | Status: AC

## 2019-08-30 MED ORDER — KETOROLAC TROMETHAMINE 30 MG/ML IJ SOLN
30.0000 mg | Freq: Once | INTRAMUSCULAR | Status: AC
  Administered 2019-08-30: 09:00:00 30 mg via INTRAMUSCULAR
  Filled 2019-08-30: qty 1

## 2019-08-30 NOTE — ED Provider Notes (Signed)
Emergency Department Provider Note   I have reviewed the triage vital signs and the nursing notes.   HISTORY  Chief Complaint Abdominal Pain, Shoulder Pain, and Migraine   HPI [name] Collier is a 24 y.o. female with PMH of asthma and one prior pregnancy ending in early miscarriage presents to the emergency department for evaluation of suprapubic pressure-like discomfort over the past week.  She describes occasional sharp type pain in the right lower quadrant that only occurs with standing or movement.  She has had subjective fever, tightness in her shoulders, and some bloating.  Her last menstrual period was December 15th.  She has taken home pregnancy tests which came back negative.  She denies any dysuria, hesitancy, urgency.  No gross hematuria.  She denies any vaginal bleeding or discharge.  No concern for STD exposure.  She denies cough, congestion, sore throat, shortness of breath.  Her subjective fever was last night and improved with taking garlic.  She did have one episode of vomiting at 2 AM but that has not persisted.  No diarrhea.   Past Medical History:  Diagnosis Date  . Asthma   . Blood in stool     Patient Active Problem List   Diagnosis Date Noted  . PCOS (polycystic ovarian syndrome) 02/22/2015  . Vaginitis 07/11/2014  . Constipation 12/31/2013  . Blood in stool   . Anemia 07/01/2013  . Dysfunctional uterine bleeding 07/01/2013  . Belching 07/01/2013  . Lower abdominal pain 07/01/2013  . BMI (body mass index), pediatric, 85% to less than 95% for age 68/08/2012  . Vitamin D insufficiency 05/30/2013  . ECZEMA 06/02/2008    Past Surgical History:  Procedure Laterality Date  . TONSILLECTOMY    . TYMPANOSTOMY TUBE PLACEMENT      Allergies Cherry, Peach [prunus persica], and Almond (diagnostic)  Family History  Problem Relation Age of Onset  . Epilepsy Mother     Social History Social History   Tobacco Use  . Smoking status: Never Smoker  .  Smokeless tobacco: Never Used  Substance Use Topics  . Alcohol use: No    Alcohol/week: 0.0 standard drinks  . Drug use: No    Review of Systems  Constitutional: Subjective fever last night.  Eyes: No visual changes. ENT: No sore throat. Cardiovascular: Denies chest pain. Respiratory: Denies shortness of breath. Gastrointestinal: Positive suprapubic abdominal pain.  No nausea, Positive vomiting.  No diarrhea.  No constipation. Genitourinary: Negative for dysuria. Musculoskeletal: Negative for back pain. Skin: Negative for rash. Neurological: Positive migraine type HA starting last night.   10-point ROS otherwise negative.  ____________________________________________   PHYSICAL EXAM:  VITAL SIGNS: ED Triage Vitals [08/30/19 0834]  Enc Vitals Group     BP (!) 139/95     Pulse Rate 89     Resp 20     Temp (!) 97.4 F (36.3 C)     Temp Source Oral     SpO2 99 %   Constitutional: Alert and oriented. Well appearing and in no acute distress. Eyes: Conjunctivae are normal.  Head: Atraumatic. Nose: No congestion/rhinnorhea. Mouth/Throat: Mucous membranes are moist.  Neck: No stridor.  Cardiovascular: Normal rate, regular rhythm. Good peripheral circulation. Grossly normal heart sounds.   Respiratory: Normal respiratory effort.  No retractions. Lungs CTAB. Gastrointestinal: Soft with mild suprapubic tenderness. No rebound or guarding. No distention.  Musculoskeletal: No gross deformities of extremities. Neurologic:  Normal speech and language.  Skin:  Skin is warm, dry and intact. No rash  noted. ____________________________________________   LABS (all labs ordered are listed, but only abnormal results are displayed)  Labs Reviewed  COMPREHENSIVE METABOLIC PANEL - Abnormal; Notable for the following components:      Result Value   Total Protein 8.2 (*)    All other components within normal limits  CBC - Abnormal; Notable for the following components:   WBC 3.2 (*)      RBC 5.12 (*)    MCH 25.6 (*)    All other components within normal limits  URINALYSIS, ROUTINE W REFLEX MICROSCOPIC - Abnormal; Notable for the following components:   APPearance HAZY (*)    Protein, ur 30 (*)    Leukocytes,Ua LARGE (*)    WBC, UA >50 (*)    Bacteria, UA MANY (*)    All other components within normal limits  URINE CULTURE  LIPASE, BLOOD  I-STAT BETA HCG BLOOD, ED ([facility], [facility], [facility] ONLY)   ____________________________________________  RADIOLOGY  None ____________________________________________   PROCEDURES  Procedure(s) performed:   Procedures  None ____________________________________________   INITIAL IMPRESSION / ASSESSMENT AND PLAN / ED COURSE  Pertinent labs & imaging results that were available during my care of the patient were reviewed by me and considered in my medical decision making (see chart for details).   Patient presents to the emergency department with lower abdominal discomfort over the past week.  Nothing focal on exam or peritonitis type findings.  Patient is well-appearing with largely unremarkable vital signs.  Plan for screening lab work, hCG, and UA.  I do not see an indication for abdominal CT or ultrasound at this time.  My suspicion for ovarian torsion is exceedingly low.  Pregnancy test pending which will narrow the differential. Patient is having a migraine type headache noted on review of systems.  No red flag signs or symptoms to suspect more serious headache etiology.  Plan for treatment pending pregnancy test.   Labs with evidence of UTI. No vaginal symptoms. Plan for abx. No concern clinically for pyelonephritis. Will start Macrobid.  ____________________________________________  FINAL CLINICAL IMPRESSION(S) / ED DIAGNOSES  Final diagnoses:  Lower abdominal pain  Acute cystitis without hematuria     MEDICATIONS GIVEN DURING THIS VISIT:  Medications  ketorolac (TORADOL) 30 MG/ML injection 30 mg (30 mg Intramuscular  Given 08/30/19 0926)     NEW OUTPATIENT MEDICATIONS STARTED DURING THIS VISIT:  Discharge Medication List as of 08/30/2019 10:22 AM    START taking these medications   Details  nitrofurantoin, macrocrystal-monohydrate, (MACROBID) 100 MG capsule Take 1 capsule (100 mg total) by mouth 2 (two) times daily for 7 days., Starting Mon 08/30/2019, Until Mon 09/06/2019, Normal        Note:  This document was prepared using Dragon voice recognition software and may include unintentional dictation errors.  Alona Bene, MD, [facility] Emergency Medicine    Hristopher Missildine, Arlyss Repress, MD 08/30/19 2120

## 2019-08-30 NOTE — Discharge Instructions (Signed)
You were seen in the emergency department today with lower abdominal discomfort.  It appears as if you have a urine infection.  I am treating you with antibiotic.  Please follow-up with your GYN if your menstrual cycles continue to be irregular.  Return to the emergency department with fever, confusion, vomiting, or back\flank pain.

## 2019-08-30 NOTE — ED Triage Notes (Signed)
Pt reports lower abd pain/bloating for the past week as well as bilateral shoulder aches. LMP 1/15, took a home pregnancy test that was negative but states there was a faint second line so she is unsure. Pt also reports migraine since Sunday. Pt a.o, nad noted

## 2019-08-31 LAB — URINE CULTURE: Culture: NO GROWTH

## 2019-11-03 ENCOUNTER — Emergency Department (HOSPITAL_COMMUNITY)
Admission: EM | Admit: 2019-11-03 | Discharge: 2019-11-03 | Disposition: A | Discharge status: Home / Self Care | Payer: Self-pay | Attending: Emergency Medicine | Admitting: Emergency Medicine

## 2019-11-03 ENCOUNTER — Other Ambulatory Visit: Payer: Self-pay

## 2019-11-03 DIAGNOSIS — R55 Syncope and collapse: Secondary | ICD-10-CM | POA: Insufficient documentation

## 2019-11-03 DIAGNOSIS — J45909 Unspecified asthma, uncomplicated: Secondary | ICD-10-CM | POA: Insufficient documentation

## 2019-11-03 LAB — URINALYSIS, COMPLETE (UACMP) WITH MICROSCOPIC
Bacteria, UA: NONE SEEN
Bilirubin Urine: NEGATIVE
Glucose, UA: NEGATIVE mg/dL
Hgb urine dipstick: NEGATIVE
Ketones, ur: NEGATIVE mg/dL
Leukocytes,Ua: NEGATIVE
Nitrite: NEGATIVE
Protein, ur: NEGATIVE mg/dL
Specific Gravity, Urine: 1.025 (ref 1.005–1.030)
pH: 7 (ref 5.0–8.0)

## 2019-11-03 LAB — CBC WITH DIFFERENTIAL/PLATELET
Abs Immature Granulocytes: 0.01 10*3/uL (ref 0.00–0.07)
Basophils Absolute: 0.1 10*3/uL (ref 0.0–0.1)
Basophils Relative: 1 %
Eosinophils Absolute: 0.3 10*3/uL (ref 0.0–0.5)
Eosinophils Relative: 4 %
HCT: 38.8 % (ref 36.0–46.0)
Hemoglobin: 12.1 g/dL (ref 12.0–15.0)
Immature Granulocytes: 0 %
Lymphocytes Relative: 28 %
Lymphs Abs: 2 10*3/uL (ref 0.7–4.0)
MCH: 25.6 pg — ABNORMAL LOW (ref 26.0–34.0)
MCHC: 31.2 g/dL (ref 30.0–36.0)
MCV: 82.2 fL (ref 80.0–100.0)
Monocytes Absolute: 0.6 10*3/uL (ref 0.1–1.0)
Monocytes Relative: 9 %
Neutro Abs: 4.1 10*3/uL (ref 1.7–7.7)
Neutrophils Relative %: 58 %
Platelets: 248 10*3/uL (ref 150–400)
RBC: 4.72 MIL/uL (ref 3.87–5.11)
RDW: 13.8 % (ref 11.5–15.5)
WBC: 7 10*3/uL (ref 4.0–10.5)
nRBC: 0 % (ref 0.0–0.2)

## 2019-11-03 LAB — COMPREHENSIVE METABOLIC PANEL
ALT: 15 U/L (ref 0–44)
AST: 22 U/L (ref 15–41)
Albumin: 4.3 g/dL (ref 3.5–5.0)
Alkaline Phosphatase: 54 U/L (ref 38–126)
Anion gap: 7 (ref 5–15)
BUN: 15 mg/dL (ref 6–20)
CO2: 27 mmol/L (ref 22–32)
Calcium: 9.1 mg/dL (ref 8.9–10.3)
Chloride: 105 mmol/L (ref 98–111)
Creatinine, Ser: 0.89 mg/dL (ref 0.44–1.00)
GFR calc Af Amer: >60 mL/min (ref >60)
GFR calc non Af Amer: >60 mL/min (ref >60)
Glucose, Bld: 85 mg/dL (ref 70–99)
Potassium: 4.1 mmol/L (ref 3.5–5.1)
Sodium: 139 mmol/L (ref 135–145)
Total Bilirubin: 0.6 mg/dL (ref 0.3–1.2)
Total Protein: 7.7 g/dL (ref 6.5–8.1)

## 2019-11-03 LAB — I-STAT BETA HCG BLOOD, ED (MC, WL, AP ONLY): I-stat hCG, quantitative: <5 m[IU]/mL (ref <5)

## 2019-11-03 NOTE — ED Provider Notes (Signed)
[name]-EMERGENCY DEPT Provider Note   CSN: [id number] Arrival date & time: 11/03/19  1723     History Chief Complaint  Patient presents with  . Loss of Consciousness    [name] Collier is a 24 y.o. female.  24 year old female with prior medical history as detailed below presents for evaluation of reported near syncopal events.  Patient reports that she has had 3 episodes today of near syncope.  She reports the first episode occurred around 11 AM.  She was at work.  She felt woozy and had a hot flash.  She then felt like she nearly passed out.  She did not fall or injure herself.  She had to additional episodes at home.  These are similar in character to the first episode.  She does have a prior history of fainting episodes.  She denies associated chest pain or palpitations.  She denies incontinence of urine.  She is comfortable now.  She specifically requests pregnancy testing.   The history is provided by the patient and medical records.  Loss of Consciousness Episode history:  Multiple Most recent episode:  Today Timing:  Sporadic Progression:  Resolved Chronicity:  New Relieved by:  Nothing Worsened by:  Nothing Associated symptoms: no chest pain, no palpitations and no seizures        Past Medical History:  Diagnosis Date  . Asthma   . Blood in stool     Patient Active Problem List   Diagnosis Date Noted  . PCOS (polycystic ovarian syndrome) 02/22/2015  . Vaginitis 07/11/2014  . Constipation 12/31/2013  . Blood in stool   . Anemia 07/01/2013  . Dysfunctional uterine bleeding 07/01/2013  . Belching 07/01/2013  . Lower abdominal pain 07/01/2013  . BMI (body mass index), pediatric, 85% to less than 95% for age 76/08/2012  . Vitamin D insufficiency 05/30/2013  . ECZEMA 06/02/2008    Past Surgical History:  Procedure Laterality Date  . TONSILLECTOMY    . TYMPANOSTOMY TUBE PLACEMENT       OB History    Gravida  0   Para  0   Term  0   Preterm  0   AB  0   Living  0     SAB  0   TAB  0   Ectopic  0   Multiple  0   Live Births              Family History  Problem Relation Age of Onset  . Epilepsy Mother     Social History   Tobacco Use  . Smoking status: Never Smoker  . Smokeless tobacco: Never Used  Substance Use Topics  . Alcohol use: No    Alcohol/week: 0.0 standard drinks  . Drug use: No    Home Medications Prior to Admission medications   Medication Sig Start Date End Date Taking? Authorizing Provider  albuterol (PROVENTIL) (2.5 MG/3ML) 0.083% nebulizer solution Take 3 mLs (2.5 mg total) by nebulization every 6 (six) hours as needed for wheezing or shortness of breath. Patient not taking: Reported on 08/30/2019 10/31/18   Long, Arlyss Repress, MD  Prenatal Vit-Fe Fumarate-FA (RIGHT STEP PRENATAL) 27-0.8 MG TABS Take 1 capsule by mouth daily. 08/08/15   [provider]    Allergies    Black walnut flavor, Valentino Saxon, Peach [prunus persica], Peanut-containing drug products, Almond (diagnostic), [place] nut (berthollefia excelsa) skin test, Cashew nut (anacardium occidentale) skin test, Macadamia nut oil, and Pistachio nut (diagnostic)  Review of  Systems   Review of Systems  Cardiovascular: Positive for syncope. Negative for chest pain and palpitations.  Neurological: Negative for seizures.  All other systems reviewed and are negative.   Physical Exam Updated Vital Signs BP (!) 147/89 (BP Location: Right Arm)   Pulse 78   Temp 98.4 F (36.9 C) (Oral)   Resp 18   Ht 5\' 6"  (1.676 m)   Wt 86.2 kg   LMP 09/02/2019 (Exact Date)   SpO2 100%   BMI 30.67 kg/m   Physical Exam Vitals and nursing note reviewed.  Constitutional:      General: She is not in acute distress.    Appearance: Normal appearance. She is well-developed.  HENT:     Head: Normocephalic and atraumatic.  Eyes:     Conjunctiva/sclera: Conjunctivae normal.     Pupils: Pupils are equal, round, and reactive  to light.  Cardiovascular:     Rate and Rhythm: Normal rate and regular rhythm.     Heart sounds: Normal heart sounds.  Pulmonary:     Effort: Pulmonary effort is normal. No respiratory distress.     Breath sounds: Normal breath sounds.  Abdominal:     General: There is no distension.     Palpations: Abdomen is soft.     Tenderness: There is no abdominal tenderness.  Musculoskeletal:        General: No deformity. Normal range of motion.     Cervical back: Normal range of motion and neck supple.  Skin:    General: Skin is warm and dry.  Neurological:     General: No focal deficit present.     Mental Status: She is alert and oriented to person, place, and time. Mental status is at baseline.     Cranial Nerves: No cranial nerve deficit.     Sensory: No sensory deficit.     Motor: No weakness.     Coordination: Coordination normal.     Gait: Gait normal.     ED Results / Procedures / Treatments   Labs (all labs ordered are listed, but only abnormal results are displayed) Labs Reviewed  CBC WITH DIFFERENTIAL/PLATELET - Abnormal; Notable for the following components:      Result Value   MCH 25.6 (*)    All other components within normal limits  COMPREHENSIVE METABOLIC PANEL  URINALYSIS, COMPLETE (UACMP) WITH MICROSCOPIC  I-STAT BETA HCG BLOOD, ED ([facility], [facility], [facility] ONLY)    EKG EKG Interpretation  Date/Time:  Wednesday November 03 2019 18:01:24 EDT Ventricular Rate:  66 PR Interval:    QRS Duration: 98 QT Interval:  374 QTC Calculation: 392 R Axis:   73 Text Interpretation: Sinus rhythm RSR' in V1 or V2, right VCD or RVH Confirmed by Dene Gentry [phone]) on 11/03/2019 8:11:15 PM   Radiology No results found.  Procedures Procedures (including critical care time)  Medications Ordered in ED Medications - No data to display  ED Course  I have reviewed the triage vital signs and the nursing notes.  Pertinent labs & imaging results that were available during my care of the  patient were reviewed by me and considered in my medical decision making (see chart for details).    MDM Rules/Calculators/A&P                      MDM  Screen complete  Hadleigh C Lipsett was evaluated in Emergency Department on 11/03/2019 for the symptoms described in the history of present illness. She was  evaluated in the context of the global COVID-19 pandemic, which necessitated consideration that the patient might be at risk for infection with the SARS-CoV-2 virus that causes COVID-19. Institutional protocols and algorithms that pertain to the evaluation of patients at risk for COVID-19 are in a state of rapid change based on information released by regulatory bodies including the CDC and federal and state organizations. These policies and algorithms were followed during the patient's care in the ED.   Patient is presenting for evaluation of reported near syncope /syncope.  Patient without red flag warning symptoms -such as palpitations, chest pain, or shortness of breath.  Screening labs obtained are without significant abnormality.  Patient is comfortable at time of discharge.  She does understand need for close follow-up.  Strict return precautions given and understood.   Final Clinical Impression(s) / ED Diagnoses Final diagnoses:  Syncope, unspecified syncope type    Rx / DC Orders ED Discharge Orders    None       Wynetta Fines, MD 11/03/19 2055

## 2019-11-03 NOTE — ED Triage Notes (Signed)
Pt reports syncope episode x3 today witnessed by family. Pt reports feeling weak each time she "woke up" from syncope episode.

## 2019-11-03 NOTE — ED Notes (Addendum)
Patients family member was brought back and registration alerted this nurse that she would like to speak with the doctor. This RN proceeded to check on the patient to see if something urgent was occurring. Patients mother began to use profanity and yell at this nurse that "I need to see a doctor, how the fuck are they gonna know what's going on with my daughter when Im out in the fucking waiting room. Francesca Oman dont witness the shit that I see happening. I was told that I could come back here by the staff outside as soon as she got a room and now yall are switching shit up" This RN proceeded to apologize for what she was told outside and told the mother that the nurse or doctor has to permit her entrance into the hospital. Patients family member became very upset with this statement and proceeded to point out that she has a badge just like mine with a higher degree. Patients family member was notified that security would be called due to the disturbance she was causing by yelling. At this point the patients family member began to advance towards me. This RN left the room as staff outside the room notified security.

## 2019-11-03 NOTE — Discharge Instructions (Signed)
Please return for any problem.  Follow-up with your care provider as instructed.  Drink plenty fluids.

## 2020-02-12 ENCOUNTER — Encounter (HOSPITAL_COMMUNITY): Payer: Self-pay | Admitting: Emergency Medicine

## 2020-02-12 ENCOUNTER — Other Ambulatory Visit: Payer: Self-pay

## 2020-02-12 ENCOUNTER — Emergency Department (HOSPITAL_COMMUNITY)
Admission: EM | Admit: 2020-02-12 | Discharge: 2020-02-12 | Disposition: A | Discharge status: Home / Self Care | Payer: Medicaid Other | Attending: Emergency Medicine | Admitting: Emergency Medicine

## 2020-02-12 DIAGNOSIS — J45909 Unspecified asthma, uncomplicated: Secondary | ICD-10-CM | POA: Insufficient documentation

## 2020-02-12 DIAGNOSIS — R300 Dysuria: Secondary | ICD-10-CM

## 2020-02-12 DIAGNOSIS — Z79899 Other long term (current) drug therapy: Secondary | ICD-10-CM | POA: Insufficient documentation

## 2020-02-12 DIAGNOSIS — Z91010 Allergy to peanuts: Secondary | ICD-10-CM | POA: Insufficient documentation

## 2020-02-12 LAB — CBC
HCT: 31.8 % — ABNORMAL LOW (ref 36.0–46.0)
Hemoglobin: 9.3 g/dL — ABNORMAL LOW (ref 12.0–15.0)
MCH: 22.6 pg — ABNORMAL LOW (ref 26.0–34.0)
MCHC: 29.2 g/dL — ABNORMAL LOW (ref 30.0–36.0)
MCV: 77.2 fL — ABNORMAL LOW (ref 80.0–100.0)
Platelets: 336 10*3/uL (ref 150–400)
RBC: 4.12 MIL/uL (ref 3.87–5.11)
RDW: 14.1 % (ref 11.5–15.5)
WBC: 5.2 10*3/uL (ref 4.0–10.5)
nRBC: 0 % (ref 0.0–0.2)

## 2020-02-12 LAB — COMPREHENSIVE METABOLIC PANEL
ALT: 16 U/L (ref 0–44)
AST: 21 U/L (ref 15–41)
Albumin: 4.1 g/dL (ref 3.5–5.0)
Alkaline Phosphatase: 55 U/L (ref 38–126)
Anion gap: 10 (ref 5–15)
BUN: 13 mg/dL (ref 6–20)
CO2: 22 mmol/L (ref 22–32)
Calcium: 9.4 mg/dL (ref 8.9–10.3)
Chloride: 105 mmol/L (ref 98–111)
Creatinine, Ser: 0.71 mg/dL (ref 0.44–1.00)
GFR calc Af Amer: >60 mL/min (ref >60)
GFR calc non Af Amer: >60 mL/min (ref >60)
Glucose, Bld: 102 mg/dL — ABNORMAL HIGH (ref 70–99)
Potassium: 3.7 mmol/L (ref 3.5–5.1)
Sodium: 137 mmol/L (ref 135–145)
Total Bilirubin: 0.2 mg/dL — ABNORMAL LOW (ref 0.3–1.2)
Total Protein: 7.3 g/dL (ref 6.5–8.1)

## 2020-02-12 LAB — WET PREP, GENITAL
Sperm: NONE SEEN
Trich, Wet Prep: NONE SEEN
WBC, Wet Prep HPF POC: NONE SEEN
Yeast Wet Prep HPF POC: NONE SEEN

## 2020-02-12 LAB — URINALYSIS, ROUTINE W REFLEX MICROSCOPIC
Bilirubin Urine: NEGATIVE
Glucose, UA: NEGATIVE mg/dL
Hgb urine dipstick: NEGATIVE
Ketones, ur: 5 mg/dL — AB
Leukocytes,Ua: NEGATIVE
Nitrite: NEGATIVE
Protein, ur: NEGATIVE mg/dL
Specific Gravity, Urine: 1.03 (ref 1.005–1.030)
pH: 5 (ref 5.0–8.0)

## 2020-02-12 LAB — I-STAT BETA HCG BLOOD, ED (MC, WL, AP ONLY): I-stat hCG, quantitative: <5 m[IU]/mL (ref <5)

## 2020-02-12 LAB — LIPASE, BLOOD: Lipase: 22 U/L (ref 11–51)

## 2020-02-12 MED ORDER — SODIUM CHLORIDE 0.9% FLUSH: 3.0000 mL | Freq: Once | INTRAVENOUS | Status: DC

## 2020-02-12 MED ORDER — LIDOCAINE HCL (PF) 1 % IJ SOLN
INTRAMUSCULAR | Status: AC
  Administered 2020-02-12: 2 mL via INTRADERMAL
  Filled 2020-02-12: qty 5

## 2020-02-12 MED ORDER — CEFTRIAXONE SODIUM 500 MG IJ SOLR
500.0000 mg | Freq: Once | INTRAMUSCULAR | Status: AC
  Administered 2020-02-12: 500 mg via INTRAMUSCULAR
  Filled 2020-02-12: qty 500

## 2020-02-12 MED ORDER — LIDOCAINE HCL (PF) 1 % IJ SOLN: 2.0000 mL | Freq: Once | INTRAMUSCULAR | Status: AC

## 2020-02-12 MED ORDER — AZITHROMYCIN 1 G PO PACK
1.0000 g | PACK | Freq: Once | ORAL | Status: AC
  Administered 2020-02-12: 1 g via ORAL
  Filled 2020-02-12: qty 1

## 2020-02-12 NOTE — ED Provider Notes (Signed)
[facility] EMERGENCY DEPARTMENT Provider Note   CSN: [id number] Arrival date & time: 02/12/20  1141     History Chief Complaint  Patient presents with  . Abdominal Pain  . Emesis  . Diarrhea    [name] Collier is a 24 y.o. female.  Patient is a 24 year old female with a past medical history of asthma, PCOS presenting to the emergency department for vague abdominal pain.  She reports intermittent upper abdominal pain for the last 1 week.  It got worse 1 time with eating but otherwise no exacerbating or relieving factors.  She is sexually active and not on BC. Reports she had prolonged menstrual bleeding for about 1-2 months and saw obgyn recently for this. Reports workup was benign. Reports some spotting this month as well but otherwise denies vaginal discharge.  Reports an episode of dysuria.  Denies any fever, chills, vomiting, chest pain, shortness of breath.        Past Medical History:  Diagnosis Date  . Asthma   . Blood in stool     Patient Active Problem List   Diagnosis Date Noted  . PCOS (polycystic ovarian syndrome) 02/22/2015  . Vaginitis 07/11/2014  . Constipation 12/31/2013  . Blood in stool   . Anemia 07/01/2013  . Dysfunctional uterine bleeding 07/01/2013  . Belching 07/01/2013  . Lower abdominal pain 07/01/2013  . BMI (body mass index), pediatric, 85% to less than 95% for age 13/08/2012  . Vitamin D insufficiency 05/30/2013  . ECZEMA 06/02/2008    Past Surgical History:  Procedure Laterality Date  . TONSILLECTOMY    . TYMPANOSTOMY TUBE PLACEMENT       OB History    Gravida  0   Para  0   Term  0   Preterm  0   AB  0   Living  0     SAB  0   TAB  0   Ectopic  0   Multiple  0   Live Births              Family History  Problem Relation Age of Onset  . Epilepsy Mother     Social History   Tobacco Use  . Smoking status: Never Smoker  . Smokeless tobacco: Never Used  Substance Use Topics  .  Alcohol use: No    Alcohol/week: 0.0 standard drinks  . Drug use: No    Home Medications Prior to Admission medications   Medication Sig Start Date End Date Taking? Authorizing Provider  albuterol (PROVENTIL) (2.5 MG/3ML) 0.083% nebulizer solution Take 3 mLs (2.5 mg total) by nebulization every 6 (six) hours as needed for wheezing or shortness of breath. Patient not taking: Reported on 08/30/2019 10/31/18   Long, Arlyss Repress, MD  Prenatal Vit-Fe Fumarate-FA (RIGHT STEP PRENATAL) 27-0.8 MG TABS Take 1 capsule by mouth daily. 08/08/15   [provider]    Allergies    Black walnut flavor, Valentino Saxon, Peach [prunus persica], Peanut-containing drug products, Almond (diagnostic), [place] nut (berthollefia excelsa) skin test, Cashew nut (anacardium occidentale) skin test, Macadamia nut oil, and Pistachio nut (diagnostic)  Review of Systems   Review of Systems  Constitutional: Negative for appetite change, chills and fever.  HENT: Negative.   Respiratory: Negative for cough and shortness of breath.   Cardiovascular: Negative for chest pain.  Gastrointestinal: Positive for abdominal pain and diarrhea. Negative for nausea and vomiting.  Genitourinary: Positive for dysuria and menstrual problem.  Musculoskeletal: Negative.   Skin:  Negative for rash.  Neurological: Negative for dizziness and light-headedness.  All other systems reviewed and are negative.   Physical Exam Updated Vital Signs BP 123/62   Pulse 92   Temp 98.2 F (36.8 C) (Oral)   Resp 16   Ht 5\' 6"  (1.676 m)   Wt 88.5 kg   LMP 02/01/2020   SpO2 99%   BMI 31.47 kg/m   Physical Exam Vitals and nursing note reviewed. Exam conducted with a chaperone present.  Constitutional:      General: She is not in acute distress.    Appearance: Normal appearance. She is well-developed. She is not ill-appearing, toxic-appearing or diaphoretic.  HENT:     Head: Normocephalic.     Mouth/Throat:     Mouth: Mucous membranes are moist.    Eyes:     Conjunctiva/sclera: Conjunctivae normal.  Cardiovascular:     Rate and Rhythm: Normal rate and regular rhythm.  Pulmonary:     Effort: Pulmonary effort is normal.  Abdominal:     General: Abdomen is flat. Bowel sounds are normal.     Palpations: Abdomen is soft.     Tenderness: There is no abdominal tenderness. There is no right CVA tenderness, left CVA tenderness, guarding or rebound. Negative signs include Murphy's sign.  Skin:    General: Skin is dry.  Neurological:     Mental Status: She is alert.  Psychiatric:        Mood and Affect: Mood normal.     ED Results / Procedures / Treatments   Labs (all labs ordered are listed, but only abnormal results are displayed) Labs Reviewed  COMPREHENSIVE METABOLIC PANEL - Abnormal; Notable for the following components:      Result Value   Glucose, Bld 102 (*)    Total Bilirubin 0.2 (*)    All other components within normal limits  CBC - Abnormal; Notable for the following components:   Hemoglobin 9.3 (*)    HCT 31.8 (*)    MCV 77.2 (*)    MCH 22.6 (*)    MCHC 29.2 (*)    All other components within normal limits  URINALYSIS, ROUTINE W REFLEX MICROSCOPIC - Abnormal; Notable for the following components:   Ketones, ur 5 (*)    All other components within normal limits  WET PREP, GENITAL  LIPASE, BLOOD  I-STAT BETA HCG BLOOD, ED ([facility], [facility], [facility] ONLY)  GC/CHLAMYDIA PROBE AMP ([place]) NOT AT [facility]    EKG None  Radiology No results found.  Procedures Procedures (including critical care time)  Medications Ordered in ED Medications  sodium chloride flush (NS) 0.9 % injection 3 mL (has no administration in time range)    ED Course  I have reviewed the triage vital signs and the nursing notes.  Pertinent labs & imaging results that were available during my care of the patient were reviewed by me and considered in my medical decision making (see chart for details).  Clinical Course as of Feb 11 1533  Sat Feb 12, 2020  1457 Patient presenting with mild and vague abdominal pain for about 1 week.  Reports that her boyfriend recently cheated on her.  She denies any nausea, vomiting, discharge, fever, chills.  On my exam, she is well-appearing with a benign abdominal exam.  She also had a benign pelvic exam.  Swabs were sent.  Discussed with patient waiting on results for STD treatment.  She would like to be treated today.  Advised on return precautions.   [  KM]    Clinical Course User Index [KM] Jeral Pinch   MDM Rules/Calculators/A&P                          Based on review of vitals, medical screening exam, lab work and/or imaging, there does not appear to be an acute, emergent etiology for the patient's symptoms. Counseled pt on good return precautions and encouraged both PCP and ED follow-up as needed.  Prior to discharge, I also discussed incidental imaging findings with patient in detail and advised appropriate, recommended follow-up in detail.  Clinical Impression: 1. Dysuria     Disposition: Discharge  Prior to providing a prescription for a controlled substance, I independently reviewed the patient's recent prescription history on the [place] Controlled Substance Reporting System. The patient had no recent or regular prescriptions and was deemed appropriate for a brief, less than 3 day prescription of narcotic for acute analgesia.  This note was prepared with assistance of Conservation officer, historic buildings. Occasional wrong-word or sound-a-like substitutions may have occurred due to the inherent limitations of voice recognition software.  Final Clinical Impression(s) / ED Diagnoses Final diagnoses:  None    Rx / DC Orders ED Discharge Orders    None       Jeral Pinch 02/12/20 1534    Geoffery Lyons, MD 02/12/20 2042

## 2020-02-12 NOTE — Discharge Instructions (Signed)
You are seen today for belly pain and dysuria.  Your work-up was reassuring with a normal urine and reassuring lab work.  You have swabs that were sent to the lab to test for gonorrhea and chlamydia.  We have treated you for gonorrhea and chlamydia today.  Please refrain from any sexual activity until all of your symptoms resolved and you know your test result.  Please follow-up with your primary care doctor for further work-up and evaluation if you continue to have symptoms.  Please return to the emergency department if you have any new or worsening symptoms.

## 2020-02-12 NOTE — ED Triage Notes (Signed)
Pt. Stated, I started having stomach pain a week ago with bouts of throwing up and diarrhea.

## 2020-02-12 NOTE — ED Notes (Signed)
Pelvic cart set up at bedside  

## 2020-02-14 LAB — GC/CHLAMYDIA PROBE AMP (~~LOC~~) NOT AT ARMC
Chlamydia: NEGATIVE
Comment: NEGATIVE
Comment: NORMAL
Neisseria Gonorrhea: NEGATIVE

## 2020-03-16 ENCOUNTER — Ambulatory Visit (INDEPENDENT_AMBULATORY_CARE_PROVIDER_SITE_OTHER): Payer: Self-pay

## 2020-03-16 ENCOUNTER — Encounter (HOSPITAL_COMMUNITY): Payer: Self-pay

## 2020-03-16 ENCOUNTER — Other Ambulatory Visit: Payer: Self-pay

## 2020-03-16 ENCOUNTER — Ambulatory Visit (HOSPITAL_COMMUNITY)
Admission: EM | Admit: 2020-03-16 | Discharge: 2020-03-16 | Disposition: A | Discharge status: Home / Self Care | Payer: Self-pay | Attending: Physician Assistant | Admitting: Physician Assistant

## 2020-03-16 DIAGNOSIS — M25571 Pain in right ankle and joints of right foot: Secondary | ICD-10-CM

## 2020-03-16 MED ORDER — IBUPROFEN 800 MG PO TABS
800.0000 mg | ORAL_TABLET | Freq: Three times a day (TID) | ORAL | 0 refills | Status: DC | PRN
Start: 2020-03-16 — End: 2021-10-02

## 2020-03-16 NOTE — ED Triage Notes (Signed)
Pt presents with right ankle injury after her ankle buckled while she was on a ladder Saturday at work.

## 2020-03-16 NOTE — ED Provider Notes (Signed)
[facility]    CSN: [id number] Arrival date & time: 03/16/20  8341      History   Chief Complaint Chief Complaint  Patient presents with  . Ankle Pain    HPI [name] Collier is a 24 y.o. female.   The history is provided by the patient. No language interpreter was used.  Ankle Pain Location:  Ankle Time since incident:  2 days Injury: yes   Ankle location:  R ankle Pain details:    Quality:  Aching   Radiates to:  Does not radiate   Severity:  Moderate   Onset quality:  Gradual   Timing:  Constant   Progression:  Worsening Chronicity:  New Dislocation: no   Foreign body present:  No foreign bodies Relieved by:  Nothing Worsened by:  Nothing Ineffective treatments:  None tried Risk factors: no recent illness     Past Medical History:  Diagnosis Date  . Asthma   . Blood in stool     Patient Active Problem List   Diagnosis Date Noted  . PCOS (polycystic ovarian syndrome) 02/22/2015  . Vaginitis 07/11/2014  . Constipation 12/31/2013  . Blood in stool   . Anemia 07/01/2013  . Dysfunctional uterine bleeding 07/01/2013  . Belching 07/01/2013  . Lower abdominal pain 07/01/2013  . BMI (body mass index), pediatric, 85% to less than 95% for age 24/08/2012  . Vitamin D insufficiency 05/30/2013  . ECZEMA 06/02/2008    Past Surgical History:  Procedure Laterality Date  . TONSILLECTOMY    . TYMPANOSTOMY TUBE PLACEMENT      OB History    Gravida  0   Para  0   Term  0   Preterm  0   AB  0   Living  0     SAB  0   TAB  0   Ectopic  0   Multiple  0   Live Births               Home Medications    Prior to Admission medications   Medication Sig Start Date End Date Taking? Authorizing Provider  albuterol (PROVENTIL) (2.5 MG/3ML) 0.083% nebulizer solution Take 3 mLs (2.5 mg total) by nebulization every 6 (six) hours as needed for wheezing or shortness of breath. Patient not taking: Reported on 08/30/2019 10/31/18   Long, Arlyss Repress, MD  Prenatal Vit-Fe Fumarate-FA (RIGHT STEP PRENATAL) 27-0.8 MG TABS Take 1 capsule by mouth daily. 08/08/15   [provider]    Family History Family History  Problem Relation Age of Onset  . Epilepsy Mother     Social History Social History   Tobacco Use  . Smoking status: Never Smoker  . Smokeless tobacco: Never Used  Substance Use Topics  . Alcohol use: No    Alcohol/week: 0.0 standard drinks  . Drug use: No     Allergies   Black walnut flavor, Cherry, Peach [prunus persica], Peanut-containing drug products, Almond (diagnostic), [place] nut (berthollefia excelsa) skin test, Cashew nut (anacardium occidentale) skin test, Macadamia nut oil, and Pistachio nut (diagnostic)   Review of Systems Review of Systems  Musculoskeletal: Positive for joint swelling.  All other systems reviewed and are negative.    Physical Exam Triage Vital Signs ED Triage Vitals  Enc Vitals Group     BP 03/16/20 0912 120/75     Pulse Rate 03/16/20 0912 74     Resp 03/16/20 0912 18     Temp 03/16/20 0912  98.6 F (37 C)     Temp Source 03/16/20 0912 Oral     SpO2 03/16/20 0912 98 %     Weight --      Height --      Head Circumference --      Peak Flow --      Pain Score 03/16/20 0914 8     Pain Loc --      Pain Edu? --      Excl. in GC? --    No data found.  Updated Vital Signs BP 120/75 (BP Location: Right Arm)   Pulse 74   Temp 98.6 F (37 C) (Oral)   Resp 18   LMP 02/01/2020 Comment: denies pregnancy, patient stated she has a history of irregular LMP's and refused a pregnancy test  SpO2 98%   Visual Acuity Right Eye Distance:   Left Eye Distance:   Bilateral Distance:    Right Eye Near:   Left Eye Near:    Bilateral Near:     Physical Exam Vitals and nursing note reviewed.  Constitutional:      Appearance: She is well-developed.  HENT:     Head: Normocephalic.  Cardiovascular:     Rate and Rhythm: Normal rate.  Pulmonary:     Effort: Pulmonary  effort is normal.  Abdominal:     General: There is no distension.  Musculoskeletal:        General: Swelling and tenderness present. Normal range of motion.     Cervical back: Normal range of motion.     Comments: Swollen tender ankle and upper foot,  Pain with movement,  nv and ns intact   Skin:    General: Skin is warm.  Neurological:     General: No focal deficit present.     Mental Status: She is alert and oriented to person, place, and time.  Psychiatric:        Mood and Affect: Mood normal.      UC Treatments / Results  Labs (all labs ordered are listed, but only abnormal results are displayed) Labs Reviewed - No data to display  EKG   Radiology DG Ankle Complete Right  Result Date: 03/16/2020 CLINICAL DATA:  Rolled RIGHT ankle getting off a ladder Saturday, anterior and lateral ankle pain EXAM: RIGHT ANKLE - COMPLETE 3+ VIEW COMPARISON:  None FINDINGS: Osseous mineralization normal. Joint spaces preserved. No fracture, dislocation, or bone destruction. IMPRESSION: Normal exam. Electronically Signed   By: Ulyses Southward M.D.   On: 03/16/2020 10:09    Procedures Procedures (including critical care time)  Medications Ordered in UC Medications - No data to display  Initial Impression / Assessment and Plan / UC Course  I have reviewed the triage vital signs and the nursing notes.  Pertinent labs & imaging results that were available during my care of the patient were reviewed by me and considered in my medical decision making (see chart for details).     MDM:  Follow up with Orthopaedist for recheck if pain persist past one week  Final Clinical Impressions(s) / UC Diagnoses   Final diagnoses:  Acute right ankle pain     Discharge Instructions     Return if any problems.     ED Prescriptions    None     PDMP not reviewed this encounter.  An After Visit Summary was printed and given to the patient.    Elson Areas, [place] 03/16/20 1033

## 2020-03-16 NOTE — Discharge Instructions (Addendum)
Return if any problems.

## 2020-05-19 ENCOUNTER — Encounter (HOSPITAL_COMMUNITY): Payer: Self-pay

## 2020-05-19 ENCOUNTER — Other Ambulatory Visit: Payer: Self-pay

## 2020-05-19 ENCOUNTER — Emergency Department (HOSPITAL_COMMUNITY)
Admission: EM | Admit: 2020-05-19 | Discharge: 2020-05-19 | Disposition: A | Discharge status: Home / Self Care | Payer: Medicaid Other | Attending: Emergency Medicine | Admitting: Emergency Medicine

## 2020-05-19 DIAGNOSIS — G43909 Migraine, unspecified, not intractable, without status migrainosus: Secondary | ICD-10-CM

## 2020-05-19 DIAGNOSIS — Z91010 Allergy to peanuts: Secondary | ICD-10-CM | POA: Insufficient documentation

## 2020-05-19 DIAGNOSIS — J45909 Unspecified asthma, uncomplicated: Secondary | ICD-10-CM | POA: Insufficient documentation

## 2020-05-19 LAB — URINALYSIS, ROUTINE W REFLEX MICROSCOPIC
Bilirubin Urine: NEGATIVE
Glucose, UA: NEGATIVE mg/dL
Hgb urine dipstick: NEGATIVE
Ketones, ur: NEGATIVE mg/dL
Leukocytes,Ua: NEGATIVE
Nitrite: NEGATIVE
Protein, ur: NEGATIVE mg/dL
Specific Gravity, Urine: 1.027 (ref 1.005–1.030)
pH: 5 (ref 5.0–8.0)

## 2020-05-19 LAB — BASIC METABOLIC PANEL
Anion gap: 10 (ref 5–15)
BUN: 10 mg/dL (ref 6–20)
CO2: 22 mmol/L (ref 22–32)
Calcium: 9.3 mg/dL (ref 8.9–10.3)
Chloride: 107 mmol/L (ref 98–111)
Creatinine, Ser: 0.73 mg/dL (ref 0.44–1.00)
GFR, Estimated: >60 mL/min (ref >60)
Glucose, Bld: 98 mg/dL (ref 70–99)
Potassium: 3.8 mmol/L (ref 3.5–5.1)
Sodium: 139 mmol/L (ref 135–145)

## 2020-05-19 LAB — CBC
HCT: 33.7 % — ABNORMAL LOW (ref 36.0–46.0)
Hemoglobin: 10.2 g/dL — ABNORMAL LOW (ref 12.0–15.0)
MCH: 22.4 pg — ABNORMAL LOW (ref 26.0–34.0)
MCHC: 30.3 g/dL (ref 30.0–36.0)
MCV: 73.9 fL — ABNORMAL LOW (ref 80.0–100.0)
Platelets: 238 10*3/uL (ref 150–400)
RBC: 4.56 MIL/uL (ref 3.87–5.11)
RDW: 21.2 % — ABNORMAL HIGH (ref 11.5–15.5)
WBC: 5.7 10*3/uL (ref 4.0–10.5)
nRBC: 0 % (ref 0.0–0.2)

## 2020-05-19 LAB — I-STAT BETA HCG BLOOD, ED (MC, WL, AP ONLY): I-stat hCG, quantitative: <5 m[IU]/mL (ref <5)

## 2020-05-19 MED ORDER — ACETAMINOPHEN 325 MG PO TABS
650.0000 mg | ORAL_TABLET | Freq: Once | ORAL | Status: AC
  Administered 2020-05-19: 650 mg via ORAL
  Filled 2020-05-19: qty 2

## 2020-05-19 MED ORDER — LIDOCAINE 5 % EX PTCH
1.0000 | MEDICATED_PATCH | Freq: Once | CUTANEOUS | Status: DC
  Administered 2020-05-19: 1 via TRANSDERMAL
  Filled 2020-05-19: qty 1

## 2020-05-19 MED ORDER — KETOROLAC TROMETHAMINE 15 MG/ML IJ SOLN
15.0000 mg | Freq: Once | INTRAMUSCULAR | Status: AC
  Administered 2020-05-19: 15 mg via INTRAVENOUS
  Filled 2020-05-19: qty 1

## 2020-05-19 MED ORDER — PROCHLORPERAZINE EDISYLATE 10 MG/2ML IJ SOLN
10.0000 mg | Freq: Once | INTRAMUSCULAR | Status: AC
  Administered 2020-05-19: 10 mg via INTRAVENOUS
  Filled 2020-05-19: qty 2

## 2020-05-19 MED ORDER — DIPHENHYDRAMINE HCL 50 MG/ML IJ SOLN
25.0000 mg | Freq: Once | INTRAMUSCULAR | Status: AC
  Administered 2020-05-19: 25 mg via INTRAVENOUS
  Filled 2020-05-19: qty 1

## 2020-05-19 NOTE — ED Triage Notes (Signed)
Pt reports that since Tuesday she has been having multiple syncopal episodes as well migraines with some dizziness. Neuro intact bilaterally

## 2020-05-19 NOTE — ED Provider Notes (Signed)
[facility] EMERGENCY DEPARTMENT Provider Note   CSN: [id number] Arrival date & time: 05/19/20  0531     History Chief Complaint  Patient presents with  . Loss of Consciousness    [name] Collier is a 24 y.o. female.  Per triage note, patient reported multiple syncopal events.  On my evaluation, patient reports more of headache with associated symptoms.  She reports that when her head seems to be hurting worse, she has periods where her vision briefly "blacks out" but quickly returns, some dizziness, nausea.  She says it has been a few years since she has had a headache like this.  The history is provided by the patient.  Headache Pain location:  L temporal Quality: pounding. Radiates to:  L neck and L shoulder Severity currently:  8/10 Severity at highest:  9/10 Onset quality:  Gradual Duration:  2 days Timing:  Constant Progression:  Worsening Chronicity:  New Similar to prior headaches: yes   Relieved by:  Nothing Worsened by:  Nothing Associated symptoms: dizziness, fatigue and nausea   Associated symptoms: no abdominal pain, no back pain, no cough, no ear pain, no eye pain, no fever, no seizures, no sore throat and no vomiting        Past Medical History:  Diagnosis Date  . Asthma   . Blood in stool     Patient Active Problem List   Diagnosis Date Noted  . PCOS (polycystic ovarian syndrome) 02/22/2015  . Vaginitis 07/11/2014  . Constipation 12/31/2013  . Blood in stool   . Anemia 07/01/2013  . Dysfunctional uterine bleeding 07/01/2013  . Belching 07/01/2013  . Lower abdominal pain 07/01/2013  . BMI (body mass index), pediatric, 85% to less than 95% for age 64/08/2012  . Vitamin D insufficiency 05/30/2013  . ECZEMA 06/02/2008    Past Surgical History:  Procedure Laterality Date  . TONSILLECTOMY    . TYMPANOSTOMY TUBE PLACEMENT       OB History    Gravida  0   Para  0   Term  0   Preterm  0   AB  0   Living  0      SAB  0   TAB  0   Ectopic  0   Multiple  0   Live Births              Family History  Problem Relation Age of Onset  . Epilepsy Mother     Social History   Tobacco Use  . Smoking status: Never Smoker  . Smokeless tobacco: Never Used  Substance Use Topics  . Alcohol use: No    Alcohol/week: 0.0 standard drinks  . Drug use: No    Home Medications Prior to Admission medications   Medication Sig Start Date End Date Taking? Authorizing Provider  albuterol (PROVENTIL) (2.5 MG/3ML) 0.083% nebulizer solution Take 3 mLs (2.5 mg total) by nebulization every 6 (six) hours as needed for wheezing or shortness of breath. Patient not taking: Reported on 08/30/2019 10/31/18   Long, Arlyss Repress, MD  ibuprofen (ADVIL) 800 MG tablet Take 1 tablet (800 mg total) by mouth every 8 (eight) hours as needed. 03/16/20   Elson Areas, PA-C  Prenatal Vit-Fe Fumarate-FA (RIGHT STEP PRENATAL) 27-0.8 MG TABS Take 1 capsule by mouth daily. 08/08/15   [provider]    Allergies    Black walnut flavor, Valentino Saxon, Peach [prunus persica], Peanut-containing drug products, Almond (diagnostic), [place] nut (berthollefia excelsa) skin  test, Cashew nut (anacardium occidentale) skin test, Macadamia nut oil, and Pistachio nut (diagnostic)  Review of Systems   Review of Systems  Constitutional: Positive for fatigue. Negative for chills and fever.  HENT: Negative for ear pain and sore throat.   Eyes: Negative for pain and visual disturbance.  Respiratory: Negative for cough and shortness of breath.   Cardiovascular: Negative for chest pain and palpitations.  Gastrointestinal: Positive for nausea. Negative for abdominal pain and vomiting.  Genitourinary: Negative for dysuria and hematuria.  Musculoskeletal: Negative for arthralgias and back pain.  Skin: Negative for color change and rash.  Neurological: Positive for dizziness and headaches. Negative for seizures and syncope.  All other systems reviewed and  are negative.   Physical Exam Updated Vital Signs BP 110/60 (BP Location: Right Arm)   Pulse 60   Temp 97.8 F (36.6 C) (Oral)   Resp 16   Ht 5\' 6"  (1.676 m)   Wt 88.9 kg   LMP 04/22/2020   SpO2 100%   BMI 31.64 kg/m   Physical Exam Vitals and nursing note reviewed.  Constitutional:      Appearance: She is well-developed. She is not ill-appearing, toxic-appearing or diaphoretic.  HENT:     Head: Normocephalic and atraumatic.  Eyes:     Conjunctiva/sclera: Conjunctivae normal.  Cardiovascular:     Rate and Rhythm: Normal rate and regular rhythm.     Heart sounds: No murmur heard.  No gallop.   Pulmonary:     Effort: Pulmonary effort is normal. No respiratory distress.     Breath sounds: Normal breath sounds.  Abdominal:     Palpations: Abdomen is soft.     Tenderness: There is no abdominal tenderness.  Musculoskeletal:     Cervical back: Neck supple.  Skin:    General: Skin is warm and dry.  Neurological:     Mental Status: She is alert.     Cranial Nerves: Cranial nerves are intact.     Sensory: Sensation is intact.     Motor: Motor function is intact.     Coordination: Coordination is intact.     Gait: Gait is intact.     Comments: Mental status: alert and oriented to person, place, time, situation. Speech: Speech is clear and language is not aphasic Fund of knowledge: Intact  Cranial Nerves:  II: Intact to confrontation bilaterally III, IV, VI: EOMI, no nystagmus V: face sensation intact, good masseter strength VII: no facial droop or weakness VIII: gross hearing intact bilaterally IX/XI: palate elevates symmetrically XII: tongue protrudes symmetrically, no deviation  Strength: 5/5 and symmetric in BUE and BLE. No pronation or drift. Tone: normal tone, no tremors Coordination: Intact finger to nose and heel to shin. Sensation: intact to light touch in all extremities.  Romberg negative.  Gait: Routine gait stable without assistance      ED  Results / Procedures / Treatments   Labs (all labs ordered are listed, but only abnormal results are displayed) Labs Reviewed  CBC - Abnormal; Notable for the following components:      Result Value   Hemoglobin 10.2 (*)    HCT 33.7 (*)    MCV 73.9 (*)    MCH 22.4 (*)    RDW 21.2 (*)    All other components within normal limits  URINALYSIS, ROUTINE W REFLEX MICROSCOPIC - Abnormal; Notable for the following components:   APPearance HAZY (*)    All other components within normal limits  BASIC METABOLIC PANEL  CBG  MONITORING, ED  I-STAT BETA HCG BLOOD, ED ([facility], [facility], [facility] ONLY)    EKG EKG Interpretation  Date/Time:  Friday May 19 2020 05:35:14 EDT Ventricular Rate:  71 PR Interval:  154 QRS Duration: 82 QT Interval:  388 QTC Calculation: 421 R Axis:   79 Text Interpretation: Normal sinus rhythm Normal ECG No STEMI Confirmed by Alvester Chou [phone]) on 05/19/2020 7:59:37 AM   Radiology No results found.  Procedures Procedures (including critical care time)  Medications Ordered in ED Medications  lidocaine (LIDODERM) 5 % 1 patch (1 patch Transdermal Patch Applied 05/19/20 1346)  acetaminophen (TYLENOL) tablet 650 mg (650 mg Oral Given 05/19/20 0841)  acetaminophen (TYLENOL) tablet 650 mg (650 mg Oral Given 05/19/20 1345)  prochlorperazine (COMPAZINE) injection 10 mg (10 mg Intravenous Given 05/19/20 1348)  diphenhydrAMINE (BENADRYL) injection 25 mg (25 mg Intravenous Given 05/19/20 1352)  ketorolac (TORADOL) 15 MG/ML injection 15 mg (15 mg Intravenous Given 05/19/20 1351)    ED Course  I have reviewed the triage vital signs and the nursing notes.  Pertinent labs & imaging results that were available during my care of the patient were reviewed by me and considered in my medical decision making (see chart for details).    MDM Rules/Calculators/A&P                          The patient is a 24yo female, PMH PCOS, migraines who presents to the ED for migraine  associated with dizziness, nausea.  On my initial evaluation, the patient is  hemodynamically stable, afebrile, nontoxic-appearing. Physical exam remarkable for normal.  Differentials considered include cardiac arrhythmia, orthostatic syncope, migraine, SAH, carotid dissection. I am most concerned for migraine. EKG with normal sinus rhythm, no evidence of ischemia or other arrhythmic changes.   Patient provided IV fluid bolus, IV Benadryl and IV Compazine as well as p.o. Tylenol and IV Toradol for headache cocktail. Labs unremarkable, patient is slightly anemic, this is unchanged from prior and attributed to AUB.  Do not think patient requires CT head or other imaging at this time with well appearance, normal neurologic exam, history of similar migraines in the past, gradual onset of headache, no recent traumatic events or injuries.  On reevaluation after the medications, patient feels much better and reports resolution of many of her symptoms.  Advised patient likely diagnosis of migraine.  Advised treatment of migraine with Tylenol and Motrin every 6 hours as needed.  Recommended follow-up with PCP, and provided phone number for patient to obtain a PCP.  Strict return precautions provided.  All questions and concerns addressed.  Patient discharged in stable condition.  The care of this patient was overseen by Dr. Lockie Mola, who agreed with evaluation and plan of care.   Final Clinical Impression(s) / ED Diagnoses Final diagnoses:  Migraine without status migrainosus, not intractable, unspecified migraine type    Rx / DC Orders ED Discharge Orders    None       Loletha Carrow, MD 05/19/20 1442    Virgina Norfolk, DO 05/19/20 1455

## 2020-05-19 NOTE — ED Provider Notes (Signed)
I have personally seen and examined the patient. I have reviewed the documentation on PMH/FH/Soc Hx. I have discussed the plan of care with the resident and patient.  I have reviewed and agree with the resident's documentation. Please see associated encounter note.  Briefly, the patient is a 24 y.o. female here with headaches, near syncope episodes.  Normal vitals.  Unremarkable EKG.  Normal neurological exam.  Mostly sounds like she is having some bad migraine headaches on and off.  Lab work showed no significant anemia, electrolyte abnormality.  Patient given headache cocktail with some improvement.  We will have her follow-up with primary care doctor.  Discharged in good condition.  Would likely benefit from wearing a Holter monitor to rule out any cardiac arrhythmias.   EKG Interpretation  Date/Time:  Friday May 19 2020 05:35:14 EDT Ventricular Rate:  71 PR Interval:  154 QRS Duration: 82 QT Interval:  388 QTC Calculation: 421 R Axis:   79 Text Interpretation: Normal sinus rhythm Normal ECG No STEMI Confirmed by Alvester Chou [phone]) on 05/19/2020 7:59:37 AM         Virgina Norfolk, DO 05/19/20 1416

## 2021-10-02 ENCOUNTER — Ambulatory Visit (HOSPITAL_COMMUNITY)
Admission: EM | Admit: 2021-10-02 | Discharge: 2021-10-02 | Disposition: A | Discharge status: Home / Self Care | Payer: 59 | Attending: Family Medicine | Admitting: Family Medicine

## 2021-10-02 ENCOUNTER — Encounter (HOSPITAL_COMMUNITY): Payer: Self-pay

## 2021-10-02 ENCOUNTER — Other Ambulatory Visit: Payer: Self-pay

## 2021-10-02 DIAGNOSIS — M545 Low back pain, unspecified: Secondary | ICD-10-CM | POA: Diagnosis not present

## 2021-10-02 DIAGNOSIS — S39012A Strain of muscle, fascia and tendon of lower back, initial encounter: Secondary | ICD-10-CM | POA: Insufficient documentation

## 2021-10-02 LAB — POCT URINALYSIS DIPSTICK, ED / UC
Bilirubin Urine: NEGATIVE
Glucose, UA: NEGATIVE mg/dL
Ketones, ur: 15 mg/dL — AB
Leukocytes,Ua: NEGATIVE
Nitrite: POSITIVE — AB
Protein, ur: NEGATIVE mg/dL
Specific Gravity, Urine: 1.025 (ref 1.005–1.030)
Urobilinogen, UA: 0.2 mg/dL (ref 0.0–1.0)
pH: 5.5 (ref 5.0–8.0)

## 2021-10-02 LAB — POC URINE PREG, ED: Preg Test, Ur: NEGATIVE

## 2021-10-02 MED ORDER — DICLOFENAC SODIUM 75 MG PO TBEC: 75.0000 mg | DELAYED_RELEASE_TABLET | Freq: Two times a day (BID) | ORAL | 0 refills | Status: DC

## 2021-10-02 MED ORDER — CYCLOBENZAPRINE HCL 10 MG PO TABS: ORAL_TABLET | ORAL | 0 refills | Status: DC

## 2021-10-02 MED ORDER — CEPHALEXIN 500 MG PO CAPS: 500.0000 mg | ORAL_CAPSULE | Freq: Two times a day (BID) | ORAL | 0 refills | Status: DC

## 2021-10-02 NOTE — ED Triage Notes (Signed)
Onset this morning upon waking of left sided low back pain that radiates into the full aspect of her thigh and leg and stopping at the ankle. ?Walking, bending and having a BM aggravates sxs. Has been taking tylenol with minimal relief. No falls or injuries reported. Denies n/t, but notes "crampy" feeling in lower leg. Notes some dysuria.  ?Denies urinary frequency, urgency and hematuria. ?

## 2021-10-02 NOTE — Discharge Instructions (Signed)
You have had labs (urine culture) sent today. We will call you with any significant abnormalities or if there is need to begin or change treatment or pursue further follow up.  You may also review your test results online through MyChart. If you do not have a MyChart account, instructions to sign up should be on your discharge paperwork.  

## 2021-10-02 NOTE — ED Provider Notes (Signed)
?[facility] ? ? ?[id number] ?10/02/21 Arrival Time: 0901 ? ?ASSESSMENT & PLAN: ? ?1. Acute left-sided low back pain without sciatica   ?2. Strain of lumbar region, initial encounter   ? ?No indications for back imaging. Suspeck MSK etiology. ?Will tx for UTI. Urine culture sent. UPT negative. ? ?Begin: ?New Prescriptions  ? CEPHALEXIN (KEFLEX) 500 MG CAPSULE    Take 1 capsule (500 mg total) by mouth 2 (two) times daily.  ? CYCLOBENZAPRINE (FLEXERIL) 10 MG TABLET    Take 1 tablet by mouth 3 times daily as needed for muscle spasm. Warning: May cause drowsiness.  ? DICLOFENAC (VOLTAREN) 75 MG EC TABLET    Take 1 tablet (75 mg total) by mouth 2 (two) times daily.  ? ?Work note provided. ? ?Orders Placed This Encounter  ?Procedures  ? Urine Culture  ? POC Urinalysis dipstick  ? POC urine pregnancy  ? ? ?Recommend: ? Follow-up Information   ? ? [facility].   ?Specialty: Urgent Care ?Why: If worsening or failing to improve as anticipated. ?Contact information: ?[address] ?[place] [address] ?[phone] ? ?  ?  ? ?  ?  ? ?  ? ? ?Reviewed expectations re: course of current medical issues. Questions answered. ?Outlined signs and symptoms indicating need for more acute intervention. ?Patient verbalized understanding. ?After Visit Summary given. ? ?SUBJECTIVE: ?History from: patient. ?[name] Collier is a 26 y.o. female who reports intermittent moderate pain of her left lower back; described as aching and sharp at times; no specific radiation but does feel it in buttocks. ?Onset: gradual. First noted:  past few days . ?Injury/trama: no but questions related to lifting a lot at work. ?Symptoms have progressed to a point and plateaued since beginning. ?Aggravating factors: certain movements and prolonged walking/standing. ?Alleviating factors: rest. ?Associated symptoms: none reported. ?Extremity sensation changes or weakness: none. ?Self treatment: has not tried OTC  therapies.  ?History of similar: no. ? ?Past Surgical History:  ?Procedure Laterality Date  ? TONSILLECTOMY    ? TYMPANOSTOMY TUBE PLACEMENT    ?  ?Also reports mild dysuria. No hematuria. ? ?OBJECTIVE: ? ?Vitals:  ? 10/02/21 1010  ?BP: 120/61  ?Pulse: 62  ?Resp: 18  ?Temp: 98.3 ?F (36.8 ?C)  ?TempSrc: Oral  ?SpO2: 100%  ?  ?General appearance: alert; no distress ?HEENT: [place]; AT ?Neck: supple with FROM ?Resp: unlabored respirations ?Extremities: moves all extremities normally ?Abd: benign ?Back: TTP over L paraspinal musculature; no midline TTP ?Skin: warm and dry; no visible rashes ?Neurologic: gait normal; normal sensation and strength of bilateral LE ?Psychological: alert and cooperative; normal mood and affect ? ? ? ?Allergies  ?Allergen Reactions  ? Black Walnut Flavor Anaphylaxis  ? Cherry Anaphylaxis and Rash  ?  Tongue tingling  ? Peach [Prunus Persica] Anaphylaxis and Rash  ?  Tongue tingling  ? Peanut-Containing Drug Products Anaphylaxis  ? Almond (Diagnostic) Other (See Comments)  ?  Tongue tingling  ? [place] Nut Sunnie Nielsen [place]) Skin Test   ? Cashew Nut (Anacardium Occidentale) Skin Test   ? Macadamia Nut Oil   ? Pistachio Nut (Diagnostic)   ? ? ?Past Medical History:  ?Diagnosis Date  ? Asthma   ? Blood in stool   ? ?Social History  ? ?Socioeconomic History  ? Marital status: Single  ?  Spouse name: Not on file  ? Number of children: Not on file  ? Years of education: Not on file  ?  Highest education level: Not on file  ?Occupational History  ? Not on file  ?Tobacco Use  ? Smoking status: Never  ? Smokeless tobacco: Never  ?Substance and Sexual Activity  ? Alcohol use: No  ?  Alcohol/week: 0.0 standard drinks  ? Drug use: No  ? Sexual activity: Yes  ?  Partners: Male  ?  Birth control/protection: None  ?Other Topics Concern  ? Not on file  ?Social History Narrative  ? Not on file  ? ?Social Determinants of Health  ? ?Financial Resource Strain: Not on file  ?Food Insecurity: Not on file   ?Transportation Needs: Not on file  ?Physical Activity: Not on file  ?Stress: Not on file  ?Social Connections: Not on file  ? ?Family History  ?Problem Relation Age of Onset  ? Epilepsy Mother   ? ?Past Surgical History:  ?Procedure Laterality Date  ? TONSILLECTOMY    ? TYMPANOSTOMY TUBE PLACEMENT    ? ? ? ?  ?Mardella Layman, MD ?10/02/21 1103 ? ?

## 2021-10-03 LAB — URINE CULTURE: Culture: 80000 — AB

## 2021-12-28 ENCOUNTER — Encounter (HOSPITAL_COMMUNITY): Payer: Self-pay

## 2021-12-28 ENCOUNTER — Ambulatory Visit (HOSPITAL_COMMUNITY)
Admission: EM | Admit: 2021-12-28 | Discharge: 2021-12-28 | Disposition: A | Discharge status: Home / Self Care | Payer: 59 | Attending: Physician Assistant | Admitting: Physician Assistant

## 2021-12-28 DIAGNOSIS — L299 Pruritus, unspecified: Secondary | ICD-10-CM

## 2021-12-28 DIAGNOSIS — L089 Local infection of the skin and subcutaneous tissue, unspecified: Secondary | ICD-10-CM | POA: Diagnosis not present

## 2021-12-28 MED ORDER — MUPIROCIN 2 % EX OINT: 1.0000 "application " | TOPICAL_OINTMENT | Freq: Every day | CUTANEOUS | 0 refills | Status: DC

## 2021-12-28 MED ORDER — CETIRIZINE HCL 10 MG PO TABS: 10.0000 mg | ORAL_TABLET | Freq: Every day | ORAL | 0 refills | Status: DC

## 2021-12-28 MED ORDER — CEPHALEXIN 500 MG PO CAPS: 500.0000 mg | ORAL_CAPSULE | Freq: Four times a day (QID) | ORAL | 0 refills | Status: DC

## 2021-12-28 NOTE — ED Triage Notes (Signed)
Patient got tattoo a week ago on the left arm. Hives started yesterday, itching, and painful. Patient was using A&D ointment on the tattoo, Patient then switched to using healing ointment, itching started, switched to hydrocortisone ointment.

## 2021-12-28 NOTE — Discharge Instructions (Addendum)
I am concerned that you might be developing an infection.  Keep area clean with soap and water and apply Bactroban ointment.  Take cephalexin 4 times a day.  This can upset your stomach so take it with food.  It is possible that this is partially allergic so I do recommend you start cetirizine daily.  You can also take famotidine at night to help with symptoms.  If you develop any fever, difficulty breathing, shortness of breath, change in your voice, widespread rash, nausea, vomiting you need to be seen immediately.

## 2021-12-28 NOTE — ED Provider Notes (Signed)
[facility]    CSN: [id number] Arrival date & time: 12/28/21  1338      History   Chief Complaint Chief Complaint  Patient presents with   Allergic Reaction    HPI [name] Collier is a 26 y.o. female.   Patient presents today with a several day history of drainage and pruritus from left arm following recent tattoo.  Reports that she has been using A&E ointment but then switch to Aquaphor.  She continues to have pruritus and drainage and so switched to hydrocortisone which provides temporary relief of symptoms.  She has had other tattoos in the past but this is the first time using this drop.  She does have a history of allergies mostly to foods but denies episodes of similar symptoms in the past.  She denies any throat swelling, dysphagia, odynophagia, shortness of breath, fever.  She denies any recent antibiotics or history of recurrent skin infections.  Denies history of MRSA.  She is confident that she is not pregnant.  She has missed work as a result of symptoms.   Past Medical History:  Diagnosis Date   Asthma    Blood in stool     Patient Active Problem List   Diagnosis Date Noted   PCOS (polycystic ovarian syndrome) 02/22/2015   Vaginitis 07/11/2014   Constipation 12/31/2013   Blood in stool    Anemia 07/01/2013   Dysfunctional uterine bleeding 07/01/2013   Belching 07/01/2013   Lower abdominal pain 07/01/2013   BMI (body mass index), pediatric, 85% to less than 95% for age 58/08/2012   Vitamin D insufficiency 05/30/2013   ECZEMA 06/02/2008    Past Surgical History:  Procedure Laterality Date   TONSILLECTOMY     TYMPANOSTOMY TUBE PLACEMENT      OB History     Gravida  0   Para  0   Term  0   Preterm  0   AB  0   Living  0      SAB  0   IAB  0   Ectopic  0   Multiple  0   Live Births               Home Medications    Prior to Admission medications   Medication Sig Start Date End Date Taking? Authorizing Provider   cephALEXin (KEFLEX) 500 MG capsule Take 1 capsule (500 mg total) by mouth 4 (four) times daily. 12/28/21  Yes Etha Stambaugh, Noberto Retort, PA-C  cetirizine (ZYRTEC ALLERGY) 10 MG tablet Take 1 tablet (10 mg total) by mouth daily. 12/28/21  Yes Marvelene Stoneberg K, PA-C  mupirocin ointment (BACTROBAN) 2 % Apply 1 application. topically daily. 12/28/21  Yes Dravyn Severs K, PA-C  albuterol (PROVENTIL) (2.5 MG/3ML) 0.083% nebulizer solution Take 3 mLs (2.5 mg total) by nebulization every 6 (six) hours as needed for wheezing or shortness of breath. Patient not taking: Reported on 08/30/2019 10/31/18   Long, Arlyss Repress, MD  cyclobenzaprine (FLEXERIL) 10 MG tablet Take 1 tablet by mouth 3 times daily as needed for muscle spasm. Warning: May cause drowsiness. 10/02/21   Mardella Layman, MD  diclofenac (VOLTAREN) 75 MG EC tablet Take 1 tablet (75 mg total) by mouth 2 (two) times daily. 10/02/21   Mardella Layman, MD    Family History Family History  Problem Relation Age of Onset   Epilepsy Mother     Social History Social History   Tobacco Use   Smoking status: Never  Smokeless tobacco: Never  Substance Use Topics   Alcohol use: No    Alcohol/week: 0.0 standard drinks   Drug use: No     Allergies   Black walnut flavor, Cherry, Peach [prunus persica], Peanut-containing drug products, Almond (diagnostic), [place] nut (berthollefia excelsa) skin test, Cashew nut (anacardium occidentale) skin test, Macadamia nut oil, and Pistachio nut (diagnostic)   Review of Systems Review of Systems  Constitutional:  Negative for activity change, appetite change, fatigue and fever.  HENT:  Negative for sore throat, trouble swallowing and voice change.   Gastrointestinal:  Negative for abdominal pain, diarrhea, nausea and vomiting.  Skin:  Positive for rash and wound.  Neurological:  Negative for dizziness, light-headedness and headaches.    Physical Exam Triage Vital Signs ED Triage Vitals  Enc Vitals Group     BP 12/28/21 1350 105/67      Pulse Rate 12/28/21 1350 80     Resp 12/28/21 1350 16     Temp 12/28/21 1350 99 F (37.2 C)     Temp Source 12/28/21 1350 Oral     SpO2 12/28/21 1350 97 %     Weight 12/28/21 1349 195 lb (88.5 kg)     Height 12/28/21 1349 5\' 6"  (1.676 m)     Head Circumference --      Peak Flow --      Pain Score 12/28/21 1348 10     Pain Loc --      Pain Edu? --      Excl. in GC? --    No data found.  Updated Vital Signs BP 105/67 (BP Location: Right Arm)   Pulse 80   Temp 99 F (37.2 C) (Oral)   Resp 16   Ht 5\' 6"  (1.676 m)   Wt 195 lb (88.5 kg)   LMP 12/01/2021 (Exact Date)   SpO2 97%   BMI 31.47 kg/m   Visual Acuity Right Eye Distance:   Left Eye Distance:   Bilateral Distance:    Right Eye Near:   Left Eye Near:    Bilateral Near:     Physical Exam Vitals reviewed.  Constitutional:      General: She is awake. She is not in acute distress.    Appearance: Normal appearance. She is well-developed. She is not ill-appearing.     Comments: Very pleasant female appears stated age in no acute distress sitting comfortably in exam room  HENT:     Head: Normocephalic and atraumatic.     Mouth/Throat:     Pharynx: Uvula midline. No oropharyngeal exudate or posterior oropharyngeal erythema.     Comments: Normal-appearing posterior oropharynx Cardiovascular:     Rate and Rhythm: Normal rate and regular rhythm.     Heart sounds: Normal heart sounds, S1 normal and S2 normal. No murmur heard. Pulmonary:     Effort: Pulmonary effort is normal.     Breath sounds: Normal breath sounds. No wheezing, rhonchi or rales.     Comments: Clear to auscultation bilaterally Abdominal:     Palpations: Abdomen is soft.     Tenderness: There is no abdominal tenderness.  Skin:    Findings: Rash present. Rash is pustular.     Comments: Erythema and swelling noted around new tattoo with several pustules.  No streaking or evidence of lymphangitis.  Psychiatric:        Behavior: Behavior is  cooperative.      UC Treatments / Results  Labs (all labs ordered are listed, but only abnormal  results are displayed) Labs Reviewed - No data to display  EKG   Radiology No results found.  Procedures Procedures (including critical care time)  Medications Ordered in UC Medications - No data to display  Initial Impression / Assessment and Plan / UC Course  I have reviewed the triage vital signs and the nursing notes.  Pertinent labs & imaging results that were available during my care of the patient were reviewed by me and considered in my medical decision making (see chart for details).     Unclear etiology of symptoms.  Concern for secondary skin infection given pustules surrounding new tattoo.  Patient was encouraged to keep area clean with soap and water and apply Bactroban ointment as needed.  We will start Keflex 500 mg 4 times daily for 5 days.  Discussed that it is possible there is an allergic component and recommended alternating H1 and H2 blocker.  Also recommended that patient keep area moisturized with hypoallergenic emollient such as Aquaphor.  Discussed that if she develops any worsening symptoms including fever, spread of rash, swelling of throat, dysphagia, odynophagia, muffled voice, nausea, vomiting she needs to go to the emergency room to which she expressed understanding.  Strict return precautions given.  Work excuse note provided.  Final Clinical Impressions(s) / UC Diagnoses   Final diagnoses:  Skin infection  Pruritic condition     Discharge Instructions      I am concerned that you might be developing an infection.  Keep area clean with soap and water and apply Bactroban ointment.  Take cephalexin 4 times a day.  This can upset your stomach so take it with food.  It is possible that this is partially allergic so I do recommend you start cetirizine daily.  You can also take famotidine at night to help with symptoms.  If you develop any fever,  difficulty breathing, shortness of breath, change in your voice, widespread rash, nausea, vomiting you need to be seen immediately.     ED Prescriptions     Medication Sig Dispense Auth. Provider   mupirocin ointment (BACTROBAN) 2 % Apply 1 application. topically daily. 22 g Rishon Thilges K, PA-C   cephALEXin (KEFLEX) 500 MG capsule Take 1 capsule (500 mg total) by mouth 4 (four) times daily. 20 capsule Muntaha Vermette K, PA-C   cetirizine (ZYRTEC ALLERGY) 10 MG tablet Take 1 tablet (10 mg total) by mouth daily. 30 tablet Eran Mistry, Noberto RetortErin K, PA-C      PDMP not reviewed this encounter.   Jeani HawkingRaspet, Valery Chance K, PA-C 12/28/21 1426

## 2022-04-09 ENCOUNTER — Emergency Department (HOSPITAL_BASED_OUTPATIENT_CLINIC_OR_DEPARTMENT_OTHER)
Admission: EM | Admit: 2022-04-09 | Discharge: 2022-04-09 | Disposition: A | Discharge status: Home / Self Care | Payer: 59 | Attending: Emergency Medicine | Admitting: Emergency Medicine

## 2022-04-09 ENCOUNTER — Encounter (HOSPITAL_BASED_OUTPATIENT_CLINIC_OR_DEPARTMENT_OTHER): Payer: Self-pay | Admitting: *Deleted

## 2022-04-09 ENCOUNTER — Other Ambulatory Visit: Payer: Self-pay

## 2022-04-09 DIAGNOSIS — Z91010 Allergy to peanuts: Secondary | ICD-10-CM | POA: Insufficient documentation

## 2022-04-09 DIAGNOSIS — R109 Unspecified abdominal pain: Secondary | ICD-10-CM | POA: Insufficient documentation

## 2022-04-09 DIAGNOSIS — Z3491 Encounter for supervision of normal pregnancy, unspecified, first trimester: Secondary | ICD-10-CM

## 2022-04-09 DIAGNOSIS — M25519 Pain in unspecified shoulder: Secondary | ICD-10-CM | POA: Insufficient documentation

## 2022-04-09 DIAGNOSIS — Z3201 Encounter for pregnancy test, result positive: Secondary | ICD-10-CM | POA: Insufficient documentation

## 2022-04-09 LAB — PREGNANCY, URINE: Preg Test, Ur: POSITIVE — AB

## 2022-04-09 NOTE — ED Notes (Signed)
No distress while in waiting area 

## 2022-04-09 NOTE — ED Provider Notes (Signed)
[facility] EMERGENCY DEPT Provider Note   CSN: [id number] Arrival date & time: 04/09/22  1731     History {Add pertinent medical, surgical, social history, OB history to HPI:1} Chief Complaint  Patient presents with   Possible Pregnancy    [name] Collier is a 26 y.o. female.  Patient is a 26 year old G0P0 with a past medical history of PCOS presenting to the ER for a pregnancy test.  Patient states last menstrual cycle was over a month ago which lasted 4 days and was spotting.  Patient took 2 pregnancy tests at home which both came back positive.  Patient reports occasional abdominal cramping and shoulder pain.  She has not taken any medication for pain.  Patient is otherwise healthy.  The history is provided by the patient.  Possible Pregnancy       Home Medications Prior to Admission medications   Medication Sig Start Date End Date Taking? Authorizing Provider  albuterol (PROVENTIL) (2.5 MG/3ML) 0.083% nebulizer solution Take 3 mLs (2.5 mg total) by nebulization every 6 (six) hours as needed for wheezing or shortness of breath. Patient not taking: Reported on 08/30/2019 10/31/18   Long, Arlyss Repress, MD  cephALEXin (KEFLEX) 500 MG capsule Take 1 capsule (500 mg total) by mouth 4 (four) times daily. 12/28/21   Raspet, Noberto Retort, PA-C  cetirizine (ZYRTEC ALLERGY) 10 MG tablet Take 1 tablet (10 mg total) by mouth daily. 12/28/21   Raspet, Noberto Retort, PA-C  cyclobenzaprine (FLEXERIL) 10 MG tablet Take 1 tablet by mouth 3 times daily as needed for muscle spasm. Warning: May cause drowsiness. 10/02/21   Mardella Layman, MD  diclofenac (VOLTAREN) 75 MG EC tablet Take 1 tablet (75 mg total) by mouth 2 (two) times daily. 10/02/21   Mardella Layman, MD  mupirocin ointment (BACTROBAN) 2 % Apply 1 application. topically daily. 12/28/21   Raspet, Noberto Retort, PA-C      Allergies    Black walnut flavor, Cherry, Peach [prunus persica], Peanut-containing drug products, Almond (diagnostic), [place] nut  (berthollefia excelsa) skin test, Cashew nut (anacardium occidentale) skin test, Macadamia nut oil, and Pistachio nut (diagnostic)    Review of Systems   Review of Systems  Genitourinary:        Endorses occasional abdominal cramping, denies vaginal discharge or bleeding.    Physical Exam Updated Vital Signs BP (!) 126/90   Pulse 63   Temp 97.9 F (36.6 C)   Resp 14   Wt 88.5 kg   LMP 03/05/2022   SpO2 100%   BMI 31.47 kg/m  Physical Exam Constitutional:      Appearance: Normal appearance.  Cardiovascular:     Rate and Rhythm: Normal rate and regular rhythm.  Pulmonary:     Effort: No respiratory distress.     Breath sounds: No wheezing.  Skin:    General: Skin is warm.  Neurological:     Mental Status: She is alert and oriented to person, place, and time.     ED Results / Procedures / Treatments   Labs (all labs ordered are listed, but only abnormal results are displayed) Labs Reviewed  PREGNANCY, URINE - Abnormal; Notable for the following components:      Result Value   Preg Test, Ur POSITIVE (*)    All other components within normal limits    EKG None  Radiology No results found.  Procedures Procedures  {Document cardiac monitor, telemetry assessment procedure when appropriate:1}  Medications Ordered in ED Medications - No data to display  ED Course/ Medical Decision Making/ A&P Clinical Course as of 04/09/22 1939  Tue Apr 09, 2022  1930 Preg Test, Ur(!): POSITIVE [KL]    Clinical Course User Index [KL] Jeanelle Malling, PA                           Medical Decision Making Patient is a 26 year old is G0P0 with a past medical history of PCOS presenting to the emergency department for a pregnancy test.  Patient has 2 positive pregnancy tests at home.  She comes to the ER for a confirmation.  Her urine pregnancy test done in the ER came back positive.  Patient reports occasional shoulder pain and abdominal cramping but denies any vaginal discharge or  bleeding. Recommended patient to take Tylenol for pain and avoid any NSAIDs.  Patient will contact an OB/GYN for further care.  Patient does not have any other concerns at the moment.   Amount and/or Complexity of Data Reviewed Labs: ordered. Decision-making details documented in ED Course.   ***  {Document critical care time when appropriate:1} {Document review of labs and clinical decision tools ie heart score, Chads2Vasc2 etc:1}  {Document your independent review of radiology images, and any outside records:1} {Document your discussion with family members, caretakers, and with consultants:1} {Document social determinants of health affecting pt's care:1} {Document your decision making why or why not admission, treatments were needed:1} Final Clinical Impression(s) / ED Diagnoses Final diagnoses:  None    Rx / DC Orders ED Discharge Orders     None

## 2022-04-09 NOTE — Discharge Instructions (Signed)
Return to the emergency department if you have persistent lower abdominal pain or back pain, vaginal bleeding, or heavy discharge.

## 2022-04-09 NOTE — ED Triage Notes (Addendum)
Pt would like to have a pregnancy test to confirm that she is pregnant.  She took two home pregnancy tests today which were positive. LMP 03/05/2022, no bleeding or pain.

## 2022-04-29 ENCOUNTER — Encounter (HOSPITAL_COMMUNITY): Payer: Self-pay | Admitting: *Deleted

## 2022-04-29 ENCOUNTER — Inpatient Hospital Stay (HOSPITAL_COMMUNITY)
Admission: AD | Admit: 2022-04-29 | Discharge: 2022-04-30 | Disposition: A | Discharge status: Home / Self Care | Payer: Medicaid Other | Attending: Obstetrics & Gynecology | Admitting: Obstetrics & Gynecology

## 2022-04-29 DIAGNOSIS — O219 Vomiting of pregnancy, unspecified: Secondary | ICD-10-CM | POA: Diagnosis not present

## 2022-04-29 DIAGNOSIS — R1010 Upper abdominal pain, unspecified: Secondary | ICD-10-CM | POA: Diagnosis not present

## 2022-04-29 DIAGNOSIS — K5900 Constipation, unspecified: Secondary | ICD-10-CM | POA: Insufficient documentation

## 2022-04-29 DIAGNOSIS — O99611 Diseases of the digestive system complicating pregnancy, first trimester: Secondary | ICD-10-CM | POA: Diagnosis not present

## 2022-04-29 DIAGNOSIS — O26891 Other specified pregnancy related conditions, first trimester: Secondary | ICD-10-CM | POA: Diagnosis present

## 2022-04-29 DIAGNOSIS — Z3A08 8 weeks gestation of pregnancy: Secondary | ICD-10-CM | POA: Diagnosis not present

## 2022-04-29 MED ORDER — PROCHLORPERAZINE EDISYLATE 10 MG/2ML IJ SOLN
10.0000 mg | Freq: Once | INTRAMUSCULAR | Status: AC
  Administered 2022-04-30: 10 mg via INTRAVENOUS
  Filled 2022-04-29: qty 2

## 2022-04-29 MED ORDER — SCOPOLAMINE 1 MG/3DAYS TD PT72
1.0000 | MEDICATED_PATCH | TRANSDERMAL | Status: DC
  Administered 2022-04-30: 1.5 mg via TRANSDERMAL
  Filled 2022-04-29: qty 1

## 2022-04-29 MED ORDER — LACTATED RINGERS IV BOLUS
1000.0000 mL | Freq: Once | INTRAVENOUS | Status: AC
  Administered 2022-04-30: 1000 mL via INTRAVENOUS

## 2022-04-29 NOTE — MAU Note (Signed)
Pt says has upper abd pain- started last Monday  On Wed BM- was pebbles . Thurs through today - no BM  Has been vomiting - started last Monday

## 2022-04-29 NOTE — MAU Provider Note (Signed)
History     CSN: [id number]  Arrival date and time: 04/29/22 2256   Event Date/Time   First Provider Initiated Contact with Patient 04/29/22 2345      Chief Complaint  Patient presents with   upper abd pain   HPI  [name] Collier is a 26 y.o. G1P0000 at [redacted]w[redacted]d who presents for evaluation of nausea, vomiting and constipation. Patient reports she has been vomiting 5 or more times a day for several days. She reports she is unable to keep anything down. She also reports burning in her sternum after throwing up. She reports she hasn't had a bowel movement since last Wednesday and it was hard to go then.   She denies any vaginal bleeding, discharge, and leaking of fluid. Denies any constipation, diarrhea or any urinary complaints.   OB History     Gravida  1   Para  0   Term  0   Preterm  0   AB  0   Living  0      SAB  0   IAB  0   Ectopic  0   Multiple  0   Live Births              Past Medical History:  Diagnosis Date   Asthma    Blood in stool     Past Surgical History:  Procedure Laterality Date   TONSILLECTOMY     TYMPANOSTOMY TUBE PLACEMENT      Family History  Problem Relation Age of Onset   Epilepsy Mother     Social History   Tobacco Use   Smoking status: Never   Smokeless tobacco: Never  Substance Use Topics   Alcohol use: No    Alcohol/week: 0.0 standard drinks of alcohol   Drug use: No    Allergies:  Allergies  Allergen Reactions   Black Walnut Flavor Anaphylaxis   Cherry Anaphylaxis and Rash    Tongue tingling   Peach [Prunus Persica] Anaphylaxis and Rash    Tongue tingling   Peanut-Containing Drug Products Anaphylaxis   Almond (Diagnostic) Other (See Comments)    Tongue tingling   [place] Nut (Berthollefia [place]) Skin Test    Cashew Nut (Anacardium Occidentale) Skin Test    Macadamia Nut Oil    Pistachio Nut (Diagnostic)     Medications Prior to Admission  Medication Sig Dispense Refill Last Dose   albuterol  (PROVENTIL) (2.5 MG/3ML) 0.083% nebulizer solution Take 3 mLs (2.5 mg total) by nebulization every 6 (six) hours as needed for wheezing or shortness of breath. (Patient not taking: Reported on 08/30/2019) 75 mL 12    cephALEXin (KEFLEX) 500 MG capsule Take 1 capsule (500 mg total) by mouth 4 (four) times daily. 20 capsule 0    cetirizine (ZYRTEC ALLERGY) 10 MG tablet Take 1 tablet (10 mg total) by mouth daily. 30 tablet 0    cyclobenzaprine (FLEXERIL) 10 MG tablet Take 1 tablet by mouth 3 times daily as needed for muscle spasm. Warning: May cause drowsiness. 21 tablet 0    diclofenac (VOLTAREN) 75 MG EC tablet Take 1 tablet (75 mg total) by mouth 2 (two) times daily. 14 tablet 0    mupirocin ointment (BACTROBAN) 2 % Apply 1 application. topically daily. 22 g 0     Review of Systems  Constitutional: Negative.  Negative for fatigue and fever.  HENT: Negative.    Respiratory: Negative.  Negative for shortness of breath.   Cardiovascular: Negative.  Negative for  chest pain.  Gastrointestinal:  Positive for constipation, nausea and vomiting. Negative for abdominal pain and diarrhea.  Genitourinary: Negative.  Negative for dysuria.  Neurological: Negative.  Negative for dizziness and headaches.   Physical Exam   Blood pressure 114/73, pulse 67, temperature 98.2 F (36.8 C), temperature source Oral, resp. rate 20, height 5\' 6"  (1.676 m), weight 88 kg, last menstrual period 03/05/2022.  Patient Vitals for the past 24 hrs:  BP Temp Temp src Pulse Resp Height Weight  04/30/22 0149 114/73 -- -- 67 -- -- --  04/29/22 2314 116/72 98.2 F (36.8 C) Oral 72 20 5\' 6"  (1.676 m) 88 kg    Physical Exam Vitals and nursing note reviewed.  Constitutional:      General: She is not in acute distress.    Appearance: She is well-developed.  HENT:     Head: Normocephalic.  Eyes:     Pupils: Pupils are equal, round, and reactive to light.  Cardiovascular:     Rate and Rhythm: Normal rate and regular rhythm.      Heart sounds: Normal heart sounds.  Pulmonary:     Effort: Pulmonary effort is normal. No respiratory distress.     Breath sounds: Normal breath sounds.  Abdominal:     General: Bowel sounds are normal. There is no distension.     Palpations: Abdomen is soft.     Tenderness: There is no abdominal tenderness.  Skin:    General: Skin is warm and dry.  Neurological:     Mental Status: She is alert and oriented to person, place, and time.  Psychiatric:        Mood and Affect: Mood normal.        Behavior: Behavior normal.        Thought Content: Thought content normal.        Judgment: Judgment normal.     MAU Course  Procedures  Results for orders placed or performed during the hospital encounter of 04/29/22 (from the past 24 hour(s))  Urinalysis, Routine w reflex microscopic Urine, Clean Catch     Status: None   Collection Time: 04/29/22 11:31 PM  Result Value Ref Range   Color, Urine YELLOW YELLOW   APPearance CLEAR CLEAR   Specific Gravity, Urine 1.010 1.005 - 1.030   pH 7.5 5.0 - 8.0   Glucose, UA NEGATIVE NEGATIVE mg/dL   Hgb urine dipstick NEGATIVE NEGATIVE   Bilirubin Urine NEGATIVE NEGATIVE   Ketones, ur NEGATIVE NEGATIVE mg/dL   Protein, ur NEGATIVE NEGATIVE mg/dL   Nitrite NEGATIVE NEGATIVE   Leukocytes,Ua NEGATIVE NEGATIVE   RBC / HPF 0-5 0 - 5 RBC/hpf   WBC, UA 0-5 0 - 5 WBC/hpf   Bacteria, UA NONE SEEN NONE SEEN   Squamous Epithelial / LPF 0-5 0 - 5   Mucus PRESENT    Amorphous Crystal PRESENT      No results found.   MDM Labs ordered and reviewed.   UA Discussed normal UA- patient's support person adamant that she is dehydrated and needs fluids  LR bolus compazine IV Scop patch  Soap Suds enema- patient able to have bowel movement and requesting discharge home  Assessment and Plan   1. Constipation during pregnancy in first trimester   2. Nausea/vomiting in pregnancy   3. [redacted] weeks gestation of pregnancy     -Discharge home in stable  condition -Rx for scop patches, reglan and phenergan sent to pharmacy -First trimester precautions discussed -Patient advised to follow-up with  OB to establish care -Patient may return to MAU as needed or if her condition were to change or worsen  Rolm Bookbinder, CNM 04/30/2022, 11:45 PM

## 2022-04-30 DIAGNOSIS — Z3A08 8 weeks gestation of pregnancy: Secondary | ICD-10-CM

## 2022-04-30 DIAGNOSIS — K5900 Constipation, unspecified: Secondary | ICD-10-CM

## 2022-04-30 DIAGNOSIS — O99611 Diseases of the digestive system complicating pregnancy, first trimester: Secondary | ICD-10-CM | POA: Diagnosis not present

## 2022-04-30 LAB — URINALYSIS, ROUTINE W REFLEX MICROSCOPIC
Bacteria, UA: NONE SEEN
Bilirubin Urine: NEGATIVE
Glucose, UA: NEGATIVE mg/dL
Hgb urine dipstick: NEGATIVE
Ketones, ur: NEGATIVE mg/dL
Leukocytes,Ua: NEGATIVE
Nitrite: NEGATIVE
Protein, ur: NEGATIVE mg/dL
Specific Gravity, Urine: 1.01 (ref 1.005–1.030)
pH: 7.5 (ref 5.0–8.0)

## 2022-04-30 MED ORDER — METOCLOPRAMIDE HCL 10 MG PO TABS: 10.0000 mg | ORAL_TABLET | Freq: Four times a day (QID) | ORAL | 0 refills | Status: DC

## 2022-04-30 MED ORDER — PROMETHAZINE HCL 25 MG PO TABS: 25.0000 mg | ORAL_TABLET | Freq: Four times a day (QID) | ORAL | 0 refills | Status: DC | PRN

## 2022-04-30 MED ORDER — SCOPOLAMINE 1 MG/3DAYS TD PT72: 1.0000 | MEDICATED_PATCH | TRANSDERMAL | 12 refills | Status: DC

## 2022-04-30 MED ORDER — DOCUSATE SODIUM 250 MG PO CAPS: 250.0000 mg | ORAL_CAPSULE | Freq: Every day | ORAL | 0 refills | Status: DC

## 2022-04-30 NOTE — Discharge Instructions (Signed)

## 2022-05-08 DIAGNOSIS — O3680X9 Pregnancy with inconclusive fetal viability, other fetus: Secondary | ICD-10-CM | POA: Diagnosis not present

## 2022-05-08 DIAGNOSIS — Z3A01 Less than 8 weeks gestation of pregnancy: Secondary | ICD-10-CM | POA: Diagnosis not present

## 2022-05-08 DIAGNOSIS — Z369 Encounter for antenatal screening, unspecified: Secondary | ICD-10-CM | POA: Diagnosis not present

## 2022-05-08 DIAGNOSIS — N912 Amenorrhea, unspecified: Secondary | ICD-10-CM | POA: Diagnosis not present

## 2022-05-08 DIAGNOSIS — Z113 Encounter for screening for infections with a predominantly sexual mode of transmission: Secondary | ICD-10-CM | POA: Diagnosis not present

## 2022-05-14 DIAGNOSIS — A549 Gonococcal infection, unspecified: Secondary | ICD-10-CM | POA: Diagnosis not present

## 2022-05-14 LAB — OB RESULTS CONSOLE GC/CHLAMYDIA
Chlamydia: NEGATIVE
Neisseria Gonorrhea: POSITIVE

## 2022-05-14 LAB — OB RESULTS CONSOLE HGB/HCT, BLOOD
HCT: 39 (ref 29–41)
Hemoglobin: 12.1

## 2022-05-14 LAB — OB RESULTS CONSOLE RPR: RPR: NONREACTIVE

## 2022-05-14 LAB — HEPATITIS C ANTIBODY: HCV Ab: NEGATIVE

## 2022-05-14 LAB — OB RESULTS CONSOLE ABO/RH: RH Type: POSITIVE

## 2022-05-14 LAB — OB RESULTS CONSOLE HIV ANTIBODY (ROUTINE TESTING): HIV: NONREACTIVE

## 2022-05-14 LAB — OB RESULTS CONSOLE HEPATITIS B SURFACE ANTIGEN: Hepatitis B Surface Ag: NEGATIVE

## 2022-05-14 LAB — OB RESULTS CONSOLE RUBELLA ANTIBODY, IGM: Rubella: IMMUNE

## 2022-05-14 LAB — OB RESULTS CONSOLE ANTIBODY SCREEN: Antibody Screen: NEGATIVE

## 2022-06-05 DIAGNOSIS — R8271 Bacteriuria: Secondary | ICD-10-CM | POA: Diagnosis not present

## 2022-06-05 DIAGNOSIS — Z3482 Encounter for supervision of other normal pregnancy, second trimester: Secondary | ICD-10-CM | POA: Diagnosis not present

## 2022-06-05 DIAGNOSIS — Z3A12 12 weeks gestation of pregnancy: Secondary | ICD-10-CM | POA: Diagnosis not present

## 2022-06-05 DIAGNOSIS — Z315 Encounter for genetic counseling: Secondary | ICD-10-CM | POA: Diagnosis not present

## 2022-06-05 DIAGNOSIS — O418X99 Other specified disorders of amniotic fluid and membranes, unspecified trimester, other fetus: Secondary | ICD-10-CM | POA: Diagnosis not present

## 2022-07-03 ENCOUNTER — Encounter (HOSPITAL_COMMUNITY): Payer: Self-pay | Admitting: Obstetrics and Gynecology

## 2022-07-03 ENCOUNTER — Inpatient Hospital Stay (HOSPITAL_BASED_OUTPATIENT_CLINIC_OR_DEPARTMENT_OTHER): Payer: Medicaid Other

## 2022-07-03 ENCOUNTER — Inpatient Hospital Stay (HOSPITAL_COMMUNITY)
Admission: AD | Admit: 2022-07-03 | Discharge: 2022-07-03 | Disposition: A | Discharge status: Home / Self Care | Payer: Medicaid Other | Attending: Obstetrics and Gynecology | Admitting: Obstetrics and Gynecology

## 2022-07-03 DIAGNOSIS — O26892 Other specified pregnancy related conditions, second trimester: Secondary | ICD-10-CM | POA: Insufficient documentation

## 2022-07-03 DIAGNOSIS — R55 Syncope and collapse: Secondary | ICD-10-CM | POA: Insufficient documentation

## 2022-07-03 DIAGNOSIS — Z3A17 17 weeks gestation of pregnancy: Secondary | ICD-10-CM | POA: Diagnosis not present

## 2022-07-03 DIAGNOSIS — R109 Unspecified abdominal pain: Secondary | ICD-10-CM | POA: Insufficient documentation

## 2022-07-03 DIAGNOSIS — R42 Dizziness and giddiness: Secondary | ICD-10-CM | POA: Insufficient documentation

## 2022-07-03 DIAGNOSIS — R0602 Shortness of breath: Secondary | ICD-10-CM | POA: Diagnosis not present

## 2022-07-03 DIAGNOSIS — N949 Unspecified condition associated with female genital organs and menstrual cycle: Secondary | ICD-10-CM

## 2022-07-03 DIAGNOSIS — R102 Pelvic and perineal pain: Secondary | ICD-10-CM | POA: Insufficient documentation

## 2022-07-03 DIAGNOSIS — O26899 Other specified pregnancy related conditions, unspecified trimester: Secondary | ICD-10-CM

## 2022-07-03 DIAGNOSIS — Z369 Encounter for antenatal screening, unspecified: Secondary | ICD-10-CM

## 2022-07-03 LAB — URINALYSIS, ROUTINE W REFLEX MICROSCOPIC
Bilirubin Urine: NEGATIVE
Glucose, UA: NEGATIVE mg/dL
Hgb urine dipstick: NEGATIVE
Ketones, ur: NEGATIVE mg/dL
Leukocytes,Ua: NEGATIVE
Nitrite: NEGATIVE
Protein, ur: NEGATIVE mg/dL
Specific Gravity, Urine: 1.014 (ref 1.005–1.030)
pH: 5 (ref 5.0–8.0)

## 2022-07-03 LAB — GC/CHLAMYDIA PROBE AMP (~~LOC~~) NOT AT ARMC
Chlamydia: NEGATIVE
Comment: NEGATIVE
Comment: NORMAL
Neisseria Gonorrhea: NEGATIVE

## 2022-07-03 LAB — WET PREP, GENITAL
Clue Cells Wet Prep HPF POC: NONE SEEN
Sperm: NONE SEEN
Trich, Wet Prep: NONE SEEN
WBC, Wet Prep HPF POC: <10 (ref <10)
Yeast Wet Prep HPF POC: NONE SEEN

## 2022-07-03 NOTE — MAU Provider Note (Signed)
History     CSN: [id number]  Arrival date and time: 07/03/22 0053   Event Date/Time   First Provider Initiated Contact with Patient 07/03/22 0246      Chief Complaint  Patient presents with   Abdominal Pain   [name] Collier , a  26 y.o. G1P0000 at [redacted]w[redacted]d presents to MAU with complaints of intermittent pain since Nov 26. Patient describes it as "pressure" and notices more at night time when turning and going from sitting to standing. Also noted sharp "stabby" pain lower midline. Attempted Tylenol yesterday 2pm without relief. Currently rate pain and tightness as 7/8. Denies Vaginal bleeding, leaking of fluid and endorses positive fetal movement.   Patient also reports "dizzy spells"  and "blacking out" while at work. Last time was around 11:30pm tonight at work. States she gets hot and feels flushed and hurries up to sit down. She states seeing black spots and feels like she is going to pass out. Denies syncopal episodes, or ever falling and enduring trauma from it.  She also noted that during this time when she checks her pulse its elevated to 140, and she is light headed and SOB. Denies hx of asthma and panic attacks. Patient states she works at a job standing for long periods of time. Endorses adequate hydration and food intake.  Patient's mother is present contributing to her HPI.          OB History     Gravida  1   Para  0   Term  0   Preterm  0   AB  0   Living  0      SAB  0   IAB  0   Ectopic  0   Multiple  0   Live Births              Past Medical History:  Diagnosis Date   Asthma    Blood in stool     Past Surgical History:  Procedure Laterality Date   TONSILLECTOMY     TYMPANOSTOMY TUBE PLACEMENT      Family History  Problem Relation Age of Onset   Epilepsy Mother     Social History   Tobacco Use   Smoking status: Never   Smokeless tobacco: Never  Vaping Use   Vaping Use: Never used  Substance Use Topics   Alcohol use: No     Alcohol/week: 0.0 standard drinks of alcohol   Drug use: No    Allergies:  Allergies  Allergen Reactions   Black Walnut Flavor Anaphylaxis   Cherry Anaphylaxis and Rash    Tongue tingling   Peach [Prunus Persica] Anaphylaxis and Rash    Tongue tingling   Peanut-Containing Drug Products Anaphylaxis   Almond (Diagnostic) Other (See Comments)    Tongue tingling   Estonia Nut (Berthollefia [place]) Skin Test    Cashew Nut (Anacardium Occidentale) Skin Test    Macadamia Nut Oil    Pistachio Nut (Diagnostic)     Medications Prior to Admission  Medication Sig Dispense Refill Last Dose   cholecalciferol (VITAMIN D3) 25 MCG (1000 UNIT) tablet Take 1,000 Units by mouth daily.   07/02/2022   Prenatal Vit-Fe Fumarate-FA (PRENATAL MULTIVITAMIN) TABS tablet Take 1 tablet by mouth daily at 12 noon.   07/02/2022   albuterol (PROVENTIL) (2.5 MG/3ML) 0.083% nebulizer solution Take 3 mLs (2.5 mg total) by nebulization every 6 (six) hours as needed for wheezing or shortness of breath. (Patient not taking: Reported on  08/30/2019) 75 mL 12    docusate sodium (COLACE) 250 MG capsule Take 1 capsule (250 mg total) by mouth daily. 30 capsule 0    metoCLOPramide (REGLAN) 10 MG tablet Take 1 tablet (10 mg total) by mouth every 6 (six) hours. 30 tablet 0    promethazine (PHENERGAN) 25 MG tablet Take 1 tablet (25 mg total) by mouth every 6 (six) hours as needed for nausea or vomiting. 30 tablet 0    scopolamine (TRANSDERM-SCOP) 1 MG/3DAYS Place 1 patch (1.5 mg total) onto the skin every 3 (three) days. 10 patch 12     Review of Systems  Constitutional:  Negative for chills, fatigue and fever.  Eyes:  Negative for pain and visual disturbance.  Respiratory:  Positive for shortness of breath. Negative for apnea and wheezing.   Cardiovascular:  Negative for chest pain and palpitations.  Gastrointestinal:  Positive for abdominal pain. Negative for constipation, diarrhea, nausea and vomiting.  Genitourinary:   Positive for pelvic pain. Negative for difficulty urinating, dysuria, vaginal bleeding, vaginal discharge and vaginal pain.  Musculoskeletal:  Negative for back pain.  Neurological:  Positive for dizziness and light-headedness. Negative for seizures, syncope, weakness and headaches.  Psychiatric/Behavioral:  Negative for suicidal ideas.    Physical Exam   Blood pressure 131/80, pulse 92, temperature 98.1 F (36.7 C), resp. rate 17, height 5\' 6"  (1.676 m), weight 96.2 kg, last menstrual period 03/05/2022, SpO2 100 %.  Physical Exam Vitals and nursing note reviewed. Exam conducted with a chaperone present.  Constitutional:      General: She is not in acute distress.    Appearance: Normal appearance.  HENT:     Head: Normocephalic.  Pulmonary:     Effort: Pulmonary effort is normal.  Abdominal:     Tenderness: There is no abdominal tenderness.  Genitourinary:    Vagina: Vaginal discharge present.     Cervix: Normal.     Comments: White clumpy discharge noted along the vaginal walls, consistent with yeast.  Musculoskeletal:     Cervical back: Normal range of motion.  Skin:    General: Skin is warm and dry.     Capillary Refill: Capillary refill takes less than 2 seconds.  Neurological:     Mental Status: She is alert and oriented to person, place, and time.  Psychiatric:        Mood and Affect: Mood normal.    Patient Vitals for the past 24 hrs:  BP Temp Pulse Resp SpO2 Height Weight  07/03/22 0107 131/80 -- -- -- -- -- --  07/03/22 0104 -- 98.1 F (36.7 C) 92 17 100 % 5\' 6"  (1.676 m) 96.2 kg     MAU Course  Procedures Orders Placed This Encounter  Procedures   Wet prep, genital   14/06/23 MFM OB LIMITED   Urinalysis, Routine w reflex microscopic Urine, Clean Catch   ED EKG   Results for orders placed or performed during the hospital encounter of 07/03/22 (from the past 24 hour(s))  Urinalysis, Routine w reflex microscopic Urine, Clean Catch     Status: Abnormal    Collection Time: 07/03/22  1:16 AM  Result Value Ref Range   Color, Urine YELLOW YELLOW   APPearance CLEAR CLEAR   Specific Gravity, Urine 1.014 1.005 - 1.030   pH 5.0 5.0 - 8.0   Glucose, UA NEGATIVE NEGATIVE mg/dL   Hgb urine dipstick NEGATIVE NEGATIVE   Bilirubin Urine NEGATIVE NEGATIVE   Ketones, ur NEGATIVE NEGATIVE mg/dL   Protein,  ur NEGATIVE NEGATIVE mg/dL   Nitrite NEGATIVE NEGATIVE   Leukocytes,Ua NEGATIVE NEGATIVE   RBC / HPF 0-5 0 - 5 RBC/hpf   WBC, UA 0-5 0 - 5 WBC/hpf   Bacteria, UA RARE (A) NONE SEEN   Squamous Epithelial / LPF 0-5 0 - 5   Mucus PRESENT      Lab results reviewed and interpreted by me.   MDM - Pain management offered; patient declined.  - UA uncomplicated. Low suspicion for UTI - EKG normal with NSR  - Wet prep normal.    - GC Pending  - Prelim [place] results revealed 1 fetus in cephalic presentation with Cardiac activity. AFI normal. Cervical Length 3.7 and closed by digital exam - Low suspicion for PTL.  - Pain worsened by movement and being on feet all day more consistent with Round ligament pain.  -Plan for discharge.  Assessment and Plan   1. Round ligament pain   2. [redacted] weeks gestation of pregnancy   3. Fainting spell   4. Abdominal pain affecting pregnancy    - Reviewed Round ligament pain as a normal discomfort of pregnancy. Recommended a maternity support belt if necessary and reviewed some stretches to perform for comfort.  - Also discussed near syncopal episodes while at work. Reviewed ways to prevent vasovagal responses and not locking patients knees while standing at work. Encouraged increase intake of Oral fluids and [place] frequent high protein meals throughout the day.  - Ambulatory referral placed to [facility].   - Preterm labor precautions reviewed.  - FHT obtained prior to discharge.  - Patient discharged home in stable condition and may return to MAU as needed.   Claudette Head, MSN CNM  07/03/2022, 4:03 AM

## 2022-07-03 NOTE — Discharge Instructions (Signed)
Round Ligament Massage & Stretches  Massage: Starting at the middle of your pubic bone, trace little circles in a wide U from your pubic bone to your hip bones on both sides.  Then starting just above your pubic bone, press in and down, alternating sides to create a gentle rocking of your uterus back and forth.  Move your hands up the sides of your belly and back down. Do this 3-5 times upon waking and before bed.  Stretches: Get on hands and knees and alternate arching your back deeply while inhaling, and then rounding your back while exhaling. Modified runners lunge:  - Sit on a chair with half of your bottom on the chair and half off.  - Sit up tall, plant your front foot, and stretch your other foot out behind you.  - Breathe deeply for 5 breaths and then do the other side.     PREGNANCY SUPPORT BELT: You are not alone, Seventy-five percent of women have some sort of abdominal or back pain at some point in their pregnancy. Your baby is growing at a fast pace, which means that your whole body is rapidly trying to adjust to the changes. As your uterus grows, your back may start feeling a bit under stress and this can result in back or abdominal pain that can go from mild, and therefore bearable, to severe pains that will not allow you to sit or lay down comfortably, When it comes to dealing with pregnancy-related pains and cramps, some pregnant women usually prefer natural remedies, which the market is filled with nowadays. For example, wearing a pregnancy support belt can help ease and lessen your discomfort and pain. WHAT ARE THE BENEFITS OF WEARING A PREGNANCY SUPPORT BELT? A pregnancy support belt provides support to the lower portion of the belly taking some of the weight of the growing uterus and distributing to the other parts of your body. It is designed make you comfortable and gives you extra support. Over the years, the pregnancy apparel market has been studying the needs and wants  of pregnant women and they have come up with the most comfortable pregnancy support belts that woman could ever ask for. In fact, you will no longer have to wear a stretched-out or bulky pregnancy belt that is visible underneath your clothes and makes you feel even more uncomfortable. Nowadays, a pregnancy support belt is made of comfortable and stretchy materials that will not irritate your skin but will actually make you feel at ease and you will not even notice you are wearing it. They are easy to put on and adjust during the day and can be worn at night for additional support.  BENEFITS: Relives Back pain Relieves Abdominal Muscle and Leg Pain Stabilizes the Pelvic Ring Offers a Cushioned Abdominal Lift Pad Relieves pressure on the Sciatic Nerve Within Minutes WHERE TO GET YOUR PREGNANCY BELT: [employer] [phone] @[phone]  [address] [place], [place]  [facility]  [address] [place], [place]  [phone]  [employer]  [phone] [place], [place]  [phone]  [employer]  [address] [place], [place]  [phone]  [employer]  [address] [place], [place]  [phone]     Prenatal Care Providers           Center for [facility] @ [facility]  [address] [phone]  Center for [facility] @ [phone]  [employer]  [phone]  [facility] @ [facility]       [address] [phone]            Center for [facility] @ [place]     [phone]          Center for [facility] @ [facility]   [address] [phone]  Center for [facility] @ [facility]  [address] [phone]     Center for [facility] Healthcare @ [address] [place])  [address]   [phone]     [facility]   Phone: [phone]  [facility]  Phone: [phone]  [facility] Phone: [phone]  [facility] Phone: [phone]  [facility] Phone: [phone]  [facility] Phone: [phone]  [facility]  Phone: [phone]

## 2022-07-03 NOTE — MAU Note (Signed)
.  [name] Collier is a 26 y.o. at [redacted]w[redacted]d here in MAU reporting tightness and cramping in abdomen since Nov 26. States was told by provider that part of her placenta had separated but provider forgot to do an u/s. Has been having off and on cramping and sharp pain in abdomen since 11/26. Had some yellow d/c 2wks ago but that stopped. Is unhappy with current provider and in the process of changing. Since getting pregnant, has had some episodes of getting hot and then "blacks out". That happened today. She feels it coming and sits down before she falls. Just wanted to be sure the baby is ok  Onset of complaint: Nov 26 Pain score: 8 Vitals:   07/03/22 0104 07/03/22 0107  BP:  131/80  Pulse: 92   Resp: 17   Temp: 98.1 F (36.7 C)   SpO2: 100%      FHT:150 Lab orders placed from triage:  u/a

## 2022-07-13 ENCOUNTER — Inpatient Hospital Stay (HOSPITAL_COMMUNITY)
Admission: AD | Admit: 2022-07-13 | Discharge: 2022-07-13 | Disposition: A | Discharge status: Home / Self Care | Payer: Medicaid Other | Attending: Obstetrics and Gynecology | Admitting: Obstetrics and Gynecology

## 2022-07-13 ENCOUNTER — Encounter (HOSPITAL_COMMUNITY): Payer: Self-pay | Admitting: Obstetrics and Gynecology

## 2022-07-13 DIAGNOSIS — Z3A18 18 weeks gestation of pregnancy: Secondary | ICD-10-CM | POA: Diagnosis not present

## 2022-07-13 DIAGNOSIS — O26892 Other specified pregnancy related conditions, second trimester: Secondary | ICD-10-CM | POA: Insufficient documentation

## 2022-07-13 DIAGNOSIS — Z6740 Type O blood, Rh positive: Secondary | ICD-10-CM | POA: Insufficient documentation

## 2022-07-13 DIAGNOSIS — S30814A Abrasion of vagina and vulva, initial encounter: Secondary | ICD-10-CM

## 2022-07-13 DIAGNOSIS — O9A212 Injury, poisoning and certain other consequences of external causes complicating pregnancy, second trimester: Secondary | ICD-10-CM | POA: Insufficient documentation

## 2022-07-13 DIAGNOSIS — Z3492 Encounter for supervision of normal pregnancy, unspecified, second trimester: Secondary | ICD-10-CM

## 2022-07-13 DIAGNOSIS — R1032 Left lower quadrant pain: Secondary | ICD-10-CM | POA: Diagnosis not present

## 2022-07-13 DIAGNOSIS — O26852 Spotting complicating pregnancy, second trimester: Secondary | ICD-10-CM | POA: Insufficient documentation

## 2022-07-13 HISTORY — DX: Polycystic ovarian syndrome: E28.2

## 2022-07-13 LAB — WET PREP, GENITAL
Sperm: NONE SEEN
Trich, Wet Prep: NONE SEEN
WBC, Wet Prep HPF POC: <10 (ref <10)
Yeast Wet Prep HPF POC: NONE SEEN

## 2022-07-13 LAB — URINALYSIS, ROUTINE W REFLEX MICROSCOPIC
Bilirubin Urine: NEGATIVE
Glucose, UA: NEGATIVE mg/dL
Hgb urine dipstick: NEGATIVE
Ketones, ur: NEGATIVE mg/dL
Leukocytes,Ua: NEGATIVE
Nitrite: NEGATIVE
Protein, ur: NEGATIVE mg/dL
Specific Gravity, Urine: 1.009 (ref 1.005–1.030)
pH: 7 (ref 5.0–8.0)

## 2022-07-13 LAB — CBC
HCT: 34.1 % — ABNORMAL LOW (ref 36.0–46.0)
Hemoglobin: 11.3 g/dL — ABNORMAL LOW (ref 12.0–15.0)
MCH: 26.4 pg (ref 26.0–34.0)
MCHC: 33.1 g/dL (ref 30.0–36.0)
MCV: 79.7 fL — ABNORMAL LOW (ref 80.0–100.0)
Platelets: 209 10*3/uL (ref 150–400)
RBC: 4.28 MIL/uL (ref 3.87–5.11)
RDW: 13.8 % (ref 11.5–15.5)
WBC: 6.2 10*3/uL (ref 4.0–10.5)
nRBC: 0 % (ref 0.0–0.2)

## 2022-07-13 MED ORDER — ACETAMINOPHEN 500 MG PO TABS
1000.0000 mg | ORAL_TABLET | Freq: Once | ORAL | Status: AC
  Administered 2022-07-13: 1000 mg via ORAL
  Filled 2022-07-13: qty 2

## 2022-07-13 NOTE — MAU Provider Note (Signed)
History     CSN: [id number]  Arrival date and time: 07/13/22 1136   Event Date/Time   First Provider Initiated Contact with Patient 07/13/22 1228      Chief Complaint  Patient presents with   Vaginal Bleeding   Abdominal Pain   HPI [name] Collier is a 26 y.o. G1P0000 at [redacted]w[redacted]d who presents to MAU with chief complaint of vaginal spotting. This is a new problem, onset today. She endorses seeing Demery bright smears of blood when she wipes after voiding. The also strained for a bowel movement earlier today and is not sure if that is related. Most recent sexual intercourse 2-3 days ago.  Patient also reports LLQ pain that is  "crampy" and does not radiate. Pain score is 3/10. She has not taken medication for this complaint because she was not sure what she could take. She denies aggravating or alleviating factors. She denies dysuria, back pain, abnormal vaginal discharge, fever or recent illness.  OB History     Gravida  1   Para  0   Term  0   Preterm  0   AB  0   Living  0      SAB  0   IAB  0   Ectopic  0   Multiple  0   Live Births              Past Medical History:  Diagnosis Date   Asthma    Blood in stool    PCOS (polycystic ovarian syndrome)     Past Surgical History:  Procedure Laterality Date   TONSILLECTOMY     TYMPANOSTOMY TUBE PLACEMENT      Family History  Problem Relation Age of Onset   Epilepsy Mother    Healthy Father     Social History   Tobacco Use   Smoking status: Never   Smokeless tobacco: Never  Vaping Use   Vaping Use: Never used  Substance Use Topics   Alcohol use: No    Alcohol/week: 0.0 standard drinks of alcohol   Drug use: No    Allergies:  Allergies  Allergen Reactions   Black Walnut Flavor Anaphylaxis   Cherry Anaphylaxis and Rash    Tongue tingling   Peach [Prunus Persica] Anaphylaxis and Rash    Tongue tingling   Peanut-Containing Drug Products Anaphylaxis   Almond (Diagnostic) Other (See  Comments)    Tongue tingling   Estonia Nut (Berthollefia [place]) Skin Test    Cashew Nut (Anacardium Occidentale) Skin Test    Macadamia Nut Oil    Pistachio Nut (Diagnostic)     Medications Prior to Admission  Medication Sig Dispense Refill Last Dose   cholecalciferol (VITAMIN D3) 25 MCG (1000 UNIT) tablet Take 1,000 Units by mouth daily.   07/07/2022   Prenatal Vit-Fe Fumarate-FA (PRENATAL MULTIVITAMIN) TABS tablet Take 1 tablet by mouth daily at 12 noon.   07/12/2022   sennosides-docusate sodium (SENOKOT-S) 8.6-50 MG tablet Take 1 tablet by mouth daily. pericolace   Past Month   albuterol (PROVENTIL) (2.5 MG/3ML) 0.083% nebulizer solution Take 3 mLs (2.5 mg total) by nebulization every 6 (six) hours as needed for wheezing or shortness of breath. (Patient not taking: Reported on 08/30/2019) 75 mL 12    docusate sodium (COLACE) 250 MG capsule Take 1 capsule (250 mg total) by mouth daily. 30 capsule 0    metoCLOPramide (REGLAN) 10 MG tablet Take 1 tablet (10 mg total) by mouth every 6 (six) hours. 30  tablet 0    promethazine (PHENERGAN) 25 MG tablet Take 1 tablet (25 mg total) by mouth every 6 (six) hours as needed for nausea or vomiting. 30 tablet 0    scopolamine (TRANSDERM-SCOP) 1 MG/3DAYS Place 1 patch (1.5 mg total) onto the skin every 3 (three) days. 10 patch 12     Review of Systems  Gastrointestinal:  Positive for abdominal pain.  Genitourinary:  Positive for vaginal bleeding.  All other systems reviewed and are negative.  Physical Exam   Blood pressure 126/67, pulse 78, temperature 98.6 F (37 C), temperature source Oral, resp. rate 17, height 5\' 6"  (1.676 m), weight 95.3 kg, last menstrual period 03/05/2022, SpO2 100 %.  Physical Exam Vitals and nursing note reviewed. Exam conducted with a chaperone present.  Constitutional:      Appearance: She is well-developed. She is not ill-appearing.  Cardiovascular:     Rate and Rhythm: Normal rate and regular rhythm.     Heart  sounds: Normal heart sounds.  Pulmonary:     Effort: Pulmonary effort is normal.     Breath sounds: Normal breath sounds.  Abdominal:     Palpations: Abdomen is soft.     Tenderness: There is no abdominal tenderness.  Genitourinary:    Comments: Pelvic exam: External genitalia normal, vaginal walls pink and well rugated, cervix visually closed, no lesions noted. Ritsema hemostatic abrasion on left labia near introitus   Neurological:     Mental Status: She is alert.     MAU Course  Procedures  MDM  --Clue cells on vaginal swab.No other Amsel's criteria. Treatment for BV not indicated  Orders Placed This Encounter  Procedures   Wet prep, genital   Urinalysis, Routine w reflex microscopic Urine, Clean Catch   CBC   Nursing communication   Discharge patient   Patient Vitals for the past 24 hrs:  BP Temp Temp src Pulse Resp SpO2 Height Weight  07/13/22 1329 127/69 -- -- 83 -- -- -- --  07/13/22 1211 126/67 98.6 F (37 C) Oral 78 17 100 % 5\' 6"  (1.676 m) 95.3 kg   Results for orders placed or performed during the hospital encounter of 07/13/22 (from the past 24 hour(s))  Urinalysis, Routine w reflex microscopic Urine, Clean Catch     Status: None   Collection Time: 07/13/22 12:12 PM  Result Value Ref Range   Color, Urine YELLOW YELLOW   APPearance CLEAR CLEAR   Specific Gravity, Urine 1.009 1.005 - 1.030   pH 7.0 5.0 - 8.0   Glucose, UA NEGATIVE NEGATIVE mg/dL   Hgb urine dipstick NEGATIVE NEGATIVE   Bilirubin Urine NEGATIVE NEGATIVE   Ketones, ur NEGATIVE NEGATIVE mg/dL   Protein, ur NEGATIVE NEGATIVE mg/dL   Nitrite NEGATIVE NEGATIVE   Leukocytes,Ua NEGATIVE NEGATIVE  Wet prep, genital     Status: Abnormal   Collection Time: 07/13/22  1:03 PM   Specimen: Vaginal  Result Value Ref Range   Yeast Wet Prep HPF POC NONE SEEN NONE SEEN   Trich, Wet Prep NONE SEEN NONE SEEN   Clue Cells Wet Prep HPF POC PRESENT (A) NONE SEEN   WBC, Wet Prep HPF POC <10 <10   Sperm NONE  SEEN   CBC     Status: Abnormal   Collection Time: 07/13/22  1:06 PM  Result Value Ref Range   WBC 6.2 4.0 - 10.5 K/uL   RBC 4.28 3.87 - 5.11 MIL/uL   Hemoglobin 11.3 (L) 12.0 - 15.0 g/dL  HCT 34.1 (L) 36.0 - 46.0 %   MCV 79.7 (L) 80.0 - 100.0 fL   MCH 26.4 26.0 - 34.0 pg   MCHC 33.1 30.0 - 36.0 g/dL   RDW 65.9 93.5 - 70.1 %   Platelets 209 150 - 400 K/uL   nRBC 0.0 0.0 - 0.2 %   Meds ordered this encounter  Medications   acetaminophen (TYLENOL) tablet 1,000 mg   Assessment and Plan  --26 y.o. G1P0000 at [redacted]w[redacted]d  --FHT 158 by Doppler --Left labial abrasion likely source of spotting --Cervix visibly closed on speculum exam --S/p formal ultrasound with normal cervical length on 07/03/2022 --Blood type O PO per [facility] records in Care Everywhere --Discharge home in stable condition  Calvert Cantor, MSA, MSN, CNM 07/13/2022, 4:34 PM

## 2022-07-13 NOTE — MAU Note (Signed)
Depaul abrasion noted near introitus on pt's left side, bleeding/redness noted when touched.  CNM called to rm to assess.  Explained to pt. Vag cultures obtained.

## 2022-07-13 NOTE — MAU Note (Signed)
[name] Collier is a 26 y.o. at [redacted]w[redacted]d here in MAU reporting: [name] Collier is a 26 y.o. at [redacted]w[redacted]d here in MAU reporting: woke up this morning had a little bit of red spotting. No hx of bleeding with preg. Last intercourse was Thursday. Had a hard BM.  strained yesterday.cramping on LLQ, also started this morning.   Onset of complaint: this morning Pain score: mild Vitals:   07/13/22 1211  BP: 126/67  Pulse: 78  Resp: 17  Temp: 98.6 F (37 C)  SpO2: 100%     FHT:158 Lab orders placed from triage:  urine

## 2022-07-15 ENCOUNTER — Ambulatory Visit: Payer: Medicaid Other | Attending: Cardiology | Admitting: Cardiology

## 2022-07-15 ENCOUNTER — Encounter: Payer: Self-pay | Admitting: Cardiology

## 2022-07-15 VITALS — BP 120/62 | HR 76 | Ht 66.0 in | Wt 211.6 lb

## 2022-07-15 DIAGNOSIS — O99210 Obesity complicating pregnancy, unspecified trimester: Secondary | ICD-10-CM

## 2022-07-15 DIAGNOSIS — Z7689 Persons encountering health services in other specified circumstances: Secondary | ICD-10-CM

## 2022-07-15 DIAGNOSIS — I951 Orthostatic hypotension: Secondary | ICD-10-CM

## 2022-07-15 DIAGNOSIS — E282 Polycystic ovarian syndrome: Secondary | ICD-10-CM

## 2022-07-15 DIAGNOSIS — Z3A19 19 weeks gestation of pregnancy: Secondary | ICD-10-CM | POA: Diagnosis not present

## 2022-07-15 LAB — GC/CHLAMYDIA PROBE AMP (~~LOC~~) NOT AT ARMC
Chlamydia: NEGATIVE
Comment: NEGATIVE
Comment: NORMAL
Neisseria Gonorrhea: NEGATIVE

## 2022-07-15 NOTE — Progress Notes (Signed)
Cardio-Obstetrics Clinic  New Evaluation  Date:  07/15/2022   ID:  [name] Collier, DOB 1995/10/29, MRN [id number]  PCP:  Patient, No Pcp Per   [facility] Providers Cardiologist:  Thomasene Ripple, DO  Electrophysiologist:  None       Referring MD: Lowella Curb*   Chief Complaint: "I have some increase heart rate"  History of Present Illness:    [name] Collier is a 26 y.o. female [G1P0000] who is being seen today for the evaluation of increased heart rate at the request of Carlynn Herald, C*.   Medical history includes PCOS, asthma here today to be evaluated for increased heart rate.  She tells me that recently she noticed that on her pulse ox her heart rate will go up to the 120s to 130s within seconds and go right back down.  She is asymptomatic at this times. She noticed that her blood pressure is on the lower side and she is feeling lightheaded but has not passed out.  She is concerned about this.  Prior CV Studies Reviewed: The following studies were reviewed today: None  Past Medical History:  Diagnosis Date   Asthma    Blood in stool    PCOS (polycystic ovarian syndrome)     Past Surgical History:  Procedure Laterality Date   TONSILLECTOMY     TYMPANOSTOMY TUBE PLACEMENT        OB History     Gravida  1   Para  0   Term  0   Preterm  0   AB  0   Living  0      SAB  0   IAB  0   Ectopic  0   Multiple  0   Live Births                  Current Medications: Current Meds  Medication Sig   albuterol (PROVENTIL) (2.5 MG/3ML) 0.083% nebulizer solution Take 3 mLs (2.5 mg total) by nebulization every 6 (six) hours as needed for wheezing or shortness of breath.   cholecalciferol (VITAMIN D3) 25 MCG (1000 UNIT) tablet Take 1,000 Units by mouth daily.   metoCLOPramide (REGLAN) 10 MG tablet Take 1 tablet (10 mg total) by mouth every 6 (six) hours.   Prenat-FeFum-FePo-FA-Omega 3 (TARON-C DHA) 35-1 MG CAPS Take 1  capsule by mouth daily.   sennosides-docusate sodium (SENOKOT-S) 8.6-50 MG tablet Take 1 tablet by mouth daily. pericolace   [DISCONTINUED] Prenatal Vit-Fe Fumarate-FA (PRENATAL MULTIVITAMIN) TABS tablet Take 1 tablet by mouth daily at 12 noon.     Allergies:   Black walnut flavor, Cherry, Peach [prunus persica], Peanut-containing drug products, Almond (diagnostic), [place] nut (berthollefia excelsa) skin test, Cashew nut (anacardium occidentale) skin test, Macadamia nut oil, and Pistachio nut (diagnostic)   Social History   Socioeconomic History   Marital status: Single    Spouse name: Not on file   Number of children: Not on file   Years of education: Not on file   Highest education level: Not on file  Occupational History   Not on file  Tobacco Use   Smoking status: Never   Smokeless tobacco: Never  Vaping Use   Vaping Use: Never used  Substance and Sexual Activity   Alcohol use: No    Alcohol/week: 0.0 standard drinks of alcohol   Drug use: No   Sexual activity: Yes    Partners: Male    Birth control/protection: None  Other Topics  Concern   Not on file  Social History Narrative   Not on file   Social Determinants of Health   Financial Resource Strain: Not on file  Food Insecurity: Not on file  Transportation Needs: Not on file  Physical Activity: Not on file  Stress: Not on file  Social Connections: Not on file      Family History  Problem Relation Age of Onset   Epilepsy Mother    Healthy Father       ROS:   Please see the history of present illness.    Increased heart rate and presyncope All other systems reviewed and are negative.   Labs/EKG Reviewed:    EKG:   EKG is was ordered today.  The ekg ordered today demonstrates sinus rhythm, heart rate 76 bpm.  Recent Labs: 07/13/2022: Hemoglobin 11.3; Platelets 209   Recent Lipid Panel No results found for: "CHOL", "TRIG", "HDL", "CHOLHDL", "LDLCALC", "LDLDIRECT"  Physical Exam:    VS:  BP 120/62    Pulse 76   Ht 5\' 6"  (1.676 m)   Wt 211 lb 9.6 oz (96 kg)   LMP 03/05/2022   SpO2 100%   BMI 34.15 kg/m     Wt Readings from Last 3 Encounters:  07/15/22 211 lb 9.6 oz (96 kg)  07/13/22 210 lb (95.3 kg)  07/03/22 212 lb (96.2 kg)     GEN:  Well nourished, well developed in no acute distress HEENT: Normal NECK: No JVD; No carotid bruits LYMPHATICS: No lymphadenopathy CARDIAC: RRR, no murmurs, rubs, gallops RESPIRATORY:  Clear to auscultation without rales, wheezing or rhonchi  ABDOMEN: Soft, non-tender, non-distended MUSCULOSKELETAL:  No edema; No deformity  SKIN: Warm and dry NEUROLOGIC:  Alert and oriented x 3 PSYCHIATRIC:  Normal affect    Risk Assessment/Risk Calculators:     CARPREG II Risk Prediction Index Score:  1.  The patient's risk for a primary cardiac event is 5%.            ASSESSMENT & PLAN:    Orthostatic hypotension   For now since the symptoms have improved, I will encourage nonpharmacologic approach at this time: Use of compression stocking (10-20 mmhg/psi) daily, avoid getting up quickly, Dont stand for long periods of time, Eat Bauza meals throughout the day- including saltines, Dont take very hot baths or showers, Drink more water/and Gatorade.  She will get blood pressure cuff and take her blood pressure and send information via my chart in 2 weeks.  The patient is in agreement with the above plan. The patient left the office in stable condition.  The patient will follow up in 4 weeks.   Patient Instructions  Medication Instructions:  Your physician recommends that you continue on your current medications as directed. Please refer to the Current Medication list given to you today.  *If you need a refill on your cardiac medications before your next appointment, please call your pharmacy*   Please purchase a blood pressure cuff   Please take your blood pressure daily for 2 weeks and send in a MyChart message. Please include heart rates.    HOW TO TAKE YOUR BLOOD PRESSURE: Rest 5 minutes before taking your blood pressure. Don't smoke or drink caffeinated beverages for at least 30 minutes before. Take your blood pressure before (not after) you eat. Sit comfortably with your back supported and both feet on the floor (don't cross your legs). Elevate your arm to heart level on a table or a desk. Use the proper  sized cuff. It should fit smoothly and snugly around your bare upper arm. There should be enough room to slip a fingertip under the cuff. The bottom edge of the cuff should be 1 inch above the crease of the elbow. Ideally, take 3 measurements at one sitting and record the average.   Please increase your fluid intake   Please get support stocking 10psi or    Lab Work: NONE If you have labs (blood work) drawn today and your tests are completely normal, you will receive your results only by: MyChart Message (if you have MyChart) OR A paper copy in the mail If you have any lab test that is abnormal or we need to change your treatment, we will call you to review the results.   Testing/Procedures: NONE   Follow-Up: At [facility], you and your health needs are our priority.  As part of our continuing mission to provide you with exceptional heart care, we have created designated Provider Care Teams.  These Care Teams include your primary Cardiologist (physician) and Advanced Practice Providers (APPs -  Physician Assistants and Nurse Practitioners) who all work together to provide you with the care you need, when you need it.  We recommend signing up for the patient portal called "MyChart".  Sign up information is provided on this After Visit Summary.  MyChart is used to connect with patients for Virtual Visits (Telemedicine).  Patients are able to view lab/test results, encounter notes, upcoming appointments, etc.  Non-urgent messages can be sent to your provider as well.   To learn more about what you can  do with MyChart, go to [email or web address].    Your next appointment:   4 week(s)  The format for your next appointment:   In Person  Provider:   Thomasene Ripple, DO     Dispo:  Return in about 4 weeks (around 08/12/2022).   Medication Adjustments/Labs and Tests Ordered: Current medicines are reviewed at length with the patient today.  Concerns regarding medicines are outlined above.  Tests Ordered: Orders Placed This Encounter  Procedures   EKG 12-Lead   Medication Changes: No orders of the defined types were placed in this encounter.

## 2022-07-15 NOTE — Patient Instructions (Addendum)
Medication Instructions:  Your physician recommends that you continue on your current medications as directed. Please refer to the Current Medication list given to you today.  *If you need a refill on your cardiac medications before your next appointment, please call your pharmacy*   Please purchase a blood pressure cuff   Please take your blood pressure daily for 2 weeks and send in a MyChart message. Please include heart rates.   HOW TO TAKE YOUR BLOOD PRESSURE: Rest 5 minutes before taking your blood pressure. Don't smoke or drink caffeinated beverages for at least 30 minutes before. Take your blood pressure before (not after) you eat. Sit comfortably with your back supported and both feet on the floor (don't cross your legs). Elevate your arm to heart level on a table or a desk. Use the proper sized cuff. It should fit smoothly and snugly around your bare upper arm. There should be enough room to slip a fingertip under the cuff. The bottom edge of the cuff should be 1 inch above the crease of the elbow. Ideally, take 3 measurements at one sitting and record the average.   Please increase your fluid intake   Please get support stocking 10psi or    Lab Work: NONE If you have labs (blood work) drawn today and your tests are completely normal, you will receive your results only by: MyChart Message (if you have MyChart) OR A paper copy in the mail If you have any lab test that is abnormal or we need to change your treatment, we will call you to review the results.   Testing/Procedures: NONE   Follow-Up: At [facility], you and your health needs are our priority.  As part of our continuing mission to provide you with exceptional heart care, we have created designated Provider Care Teams.  These Care Teams include your primary Cardiologist (physician) and Advanced Practice Providers (APPs -  Physician Assistants and Nurse Practitioners) who all work together to  provide you with the care you need, when you need it.  We recommend signing up for the patient portal called "MyChart".  Sign up information is provided on this After Visit Summary.  MyChart is used to connect with patients for Virtual Visits (Telemedicine).  Patients are able to view lab/test results, encounter notes, upcoming appointments, etc.  Non-urgent messages can be sent to your provider as well.   To learn more about what you can do with MyChart, go to [email or web address].    Your next appointment:   4 week(s)  The format for your next appointment:   In Person  Provider:   Thomasene Ripple, DO

## 2022-08-01 ENCOUNTER — Encounter: Payer: Self-pay | Admitting: Obstetrics and Gynecology

## 2022-08-01 ENCOUNTER — Ambulatory Visit (INDEPENDENT_AMBULATORY_CARE_PROVIDER_SITE_OTHER): Payer: Medicaid Other | Admitting: Obstetrics and Gynecology

## 2022-08-01 VITALS — BP 136/82 | HR 96 | Wt 221.0 lb

## 2022-08-01 DIAGNOSIS — O0990 Supervision of high risk pregnancy, unspecified, unspecified trimester: Secondary | ICD-10-CM | POA: Insufficient documentation

## 2022-08-01 DIAGNOSIS — Z3490 Encounter for supervision of normal pregnancy, unspecified, unspecified trimester: Secondary | ICD-10-CM | POA: Insufficient documentation

## 2022-08-01 DIAGNOSIS — O98212 Gonorrhea complicating pregnancy, second trimester: Secondary | ICD-10-CM

## 2022-08-01 DIAGNOSIS — O99210 Obesity complicating pregnancy, unspecified trimester: Secondary | ICD-10-CM | POA: Insufficient documentation

## 2022-08-01 DIAGNOSIS — O98211 Gonorrhea complicating pregnancy, first trimester: Secondary | ICD-10-CM | POA: Insufficient documentation

## 2022-08-01 DIAGNOSIS — Z6836 Body mass index (BMI) 36.0-36.9, adult: Secondary | ICD-10-CM | POA: Insufficient documentation

## 2022-08-01 DIAGNOSIS — Z3400 Encounter for supervision of normal first pregnancy, unspecified trimester: Secondary | ICD-10-CM | POA: Diagnosis not present

## 2022-08-01 DIAGNOSIS — O99212 Obesity complicating pregnancy, second trimester: Secondary | ICD-10-CM

## 2022-08-01 DIAGNOSIS — O2341 Unspecified infection of urinary tract in pregnancy, first trimester: Secondary | ICD-10-CM

## 2022-08-01 DIAGNOSIS — Z3A21 21 weeks gestation of pregnancy: Secondary | ICD-10-CM

## 2022-08-01 HISTORY — DX: Gonorrhea complicating pregnancy, first trimester: O98.211

## 2022-08-01 MED ORDER — ASPIRIN 81 MG PO TBEC: 81.0000 mg | DELAYED_RELEASE_TABLET | Freq: Every day | ORAL | 2 refills | Status: DC

## 2022-08-01 NOTE — Progress Notes (Signed)
   PRENATAL VISIT NOTE  Subjective:  [name] Collier is a 27 y.o. G1P0000 at [redacted]w[redacted]d being seen today for ongoing prenatal care.  She is currently monitored for the following issues for this low-risk pregnancy and has ECZEMA; BMI (body mass index), pediatric, 85% to less than 95% for age; Vitamin D insufficiency; Anemia; Dysfunctional uterine bleeding; Belching; Lower abdominal pain; Blood in stool; Constipation; Vaginitis; PCOS (polycystic ovarian syndrome); Supervision of normal pregnancy, antepartum; Gonorrhea affecting pregnancy in first trimester; and Urinary tract infection in mother during first trimester of pregnancy on their problem list.  Patient reports no complaints.  Contractions: Not present. Vag. Bleeding: None.  Movement: Present. Denies leaking of fluid.   The following portions of the patient's history were reviewed and updated as appropriate: allergies, current medications, past family history, past medical history, past social history, past surgical history and problem list.   Objective:   Vitals:   08/01/22 1458  BP: 136/82  Pulse: 96  Weight: 221 lb (100.2 kg)    Fetal Status: Fetal Heart Rate (bpm): 164   Movement: Present     General:  Alert, oriented and cooperative. Patient is in no acute distress.  Skin: Skin is warm and dry. No rash noted.   Cardiovascular: Normal heart rate noted  Respiratory: Normal respiratory effort, no problems with respiration noted  Abdomen: Soft, gravid, appropriate for gestational age.  Pain/Pressure: Absent     Pelvic: Cervical exam deferred        Extremities: Normal range of motion.     Mental Status: Normal mood and affect. Normal behavior. Normal judgment and thought content.   Assessment and Plan:  Pregnancy: G1P0000 at [redacted]w[redacted]d 1. Supervision of normal first pregnancy, antepartum Anatomy u/s ordered Has repeat cards visit for h/o tachycardia.  - [place] MFM OB DETAIL +14 WK; Future - Culture, OB Urine  2. Gonorrhea affecting  pregnancy in first trimester Toc neg  3. Urinary tract infection in mother during first trimester of pregnancy Toc today - Culture, OB Urine  4. [redacted] weeks gestation of pregnancy Pt amenable to starting low dose ASA  Preterm labor symptoms and general obstetric precautions including but not limited to vaginal bleeding, contractions, leaking of fluid and fetal movement were reviewed in detail with the patient. Please refer to After Visit Summary for other counseling recommendations.   Return in about 3 weeks (around 08/22/2022) for in person, low risk ob, with cnm.  Future Appointments  Date Time Provider [place]  08/21/2022  8:20 AM Berniece Salines, DO [facility] None  08/29/2022  3:50 PM Aletha Halim, MD [facility]  09/26/2022  8:45 AM [facility]  09/26/2022  9:35 AM Aletha Halim, MD [facility]    Aletha Halim, MD

## 2022-08-03 LAB — URINE CULTURE, OB REFLEX

## 2022-08-03 LAB — CULTURE, OB URINE

## 2022-08-14 ENCOUNTER — Other Ambulatory Visit: Payer: Self-pay | Admitting: *Deleted

## 2022-08-14 MED ORDER — METRONIDAZOLE 500 MG PO TABS: 500.0000 mg | ORAL_TABLET | Freq: Two times a day (BID) | ORAL | 0 refills | Status: AC

## 2022-08-21 ENCOUNTER — Ambulatory Visit: Payer: Medicaid Other | Attending: Cardiology | Admitting: Cardiology

## 2022-08-21 ENCOUNTER — Encounter: Payer: Self-pay | Admitting: Cardiology

## 2022-08-21 VITALS — BP 118/62 | HR 103 | Ht 66.0 in | Wt 220.0 lb

## 2022-08-21 DIAGNOSIS — I951 Orthostatic hypotension: Secondary | ICD-10-CM

## 2022-08-21 NOTE — Patient Instructions (Signed)
Medication Instructions:  Your physician recommends that you continue on your current medications as directed. Please refer to the Current Medication list given to you today.  *If you need a refill on your cardiac medications before your next appointment, please call your pharmacy*   Lab Work: None  Testing/Procedures: None  Follow-Up: At [facility], you and your health needs are our priority.  As part of our continuing mission to provide you with exceptional heart care, we have created designated Provider Care Teams.  These Care Teams include your primary Cardiologist (physician) and Advanced Practice Providers (APPs -  Physician Assistants and Nurse Practitioners) who all work together to provide you with the care you need, when you need it.  We recommend signing up for the patient portal called "MyChart".  Sign up information is provided on this After Visit Summary.  MyChart is used to connect with patients for Virtual Visits (Telemedicine).  Patients are able to view lab/test results, encounter notes, upcoming appointments, etc.  Non-urgent messages can be sent to your provider as well.   To learn more about what you can do with MyChart, go to [email or web address].    Your next appointment:   March 18th 2024  Provider:   Berniece Salines, DO

## 2022-08-21 NOTE — Progress Notes (Signed)
Cardio-Obstetrics Clinic  New Evaluation  Date:  08/21/2022   ID:  [name] Collier, DOB [birth year], MRN [id number]  PCP:  Patient, No Pcp Per   [place] Providers Cardiologist:  Berniece Salines, DO  Electrophysiologist:  None       Referring MD: No ref. provider found   Chief Complaint: "I have some increase heart rate"  History of Present Illness:    [name] Collier is a 27 y.o. female [G1P0000] who is being seen today for the evaluation of increased heart rate at the request of No ref. provider found.   Medical history includes PCOS, asthma here today for follow-up visit.  At her last visit she was experiencing orthostatic hypotension.  I discussed nonpharmacologic approaches with the patient.  She is here today for follow-up visit.  Prior CV Studies Reviewed: The following studies were reviewed today: None  Past Medical History:  Diagnosis Date   Anemia    Asthma    Blood in stool    Headache    PCOS (polycystic ovarian syndrome)    Vaginal Pap smear, abnormal     Past Surgical History:  Procedure Laterality Date   TONSILLECTOMY     TYMPANOSTOMY TUBE PLACEMENT        OB History     Gravida  1   Para  0   Term  0   Preterm  0   AB  0   Living  0      SAB  0   IAB  0   Ectopic  0   Multiple  0   Live Births                  Current Medications: Current Meds  Medication Sig   albuterol (PROVENTIL) (2.5 MG/3ML) 0.083% nebulizer solution Take 3 mLs (2.5 mg total) by nebulization every 6 (six) hours as needed for wheezing or shortness of breath.   cholecalciferol (VITAMIN D3) 25 MCG (1000 UNIT) tablet Take 1,000 Units by mouth daily.   metroNIDAZOLE (FLAGYL) 500 MG tablet Take 1 tablet (500 mg total) by mouth 2 (two) times daily for 7 days.   Prenat-FeFum-FePo-FA-Omega 3 (TARON-C DHA) 35-1 MG CAPS Take 1 capsule by mouth daily.     Allergies:   Black walnut flavor, Cherry, Peach [prunus persica], Peanut-containing drug  products, Almond (diagnostic), [place] nut (berthollefia excelsa) skin test, Cashew nut (anacardium occidentale) skin test, Macadamia nut oil, and Pistachio nut (diagnostic)   Social History   Socioeconomic History   Marital status: Single    Spouse name: Not on file   Number of children: Not on file   Years of education: Not on file   Highest education level: Not on file  Occupational History   Not on file  Tobacco Use   Smoking status: Never   Smokeless tobacco: Never  Vaping Use   Vaping Use: Never used  Substance and Sexual Activity   Alcohol use: No    Alcohol/week: 0.0 standard drinks of alcohol   Drug use: No   Sexual activity: Yes    Partners: Male    Birth control/protection: None  Other Topics Concern   Not on file  Social History Narrative   Not on file   Social Determinants of Health   Financial Resource Strain: Not on file  Food Insecurity: Not on file  Transportation Needs: Not on file  Physical Activity: Not on file  Stress: Not on file  Social Connections: Not on file  Family History  Problem Relation Age of Onset   Epilepsy Mother    Healthy Father       ROS:   Please see the history of present illness.    Increased heart rate and presyncope All other systems reviewed and are negative.   Labs/EKG Reviewed:    EKG:   EKG is was ordered today.  The ekg ordered today demonstrates sinus rhythm, heart rate 76 bpm.  Recent Labs: 07/13/2022: Hemoglobin 11.3; Platelets 209   Recent Lipid Panel No results found for: "CHOL", "TRIG", "HDL", "CHOLHDL", "LDLCALC", "LDLDIRECT"  Physical Exam:    VS:  BP 118/62   Pulse (!) 103   Ht 5\' 6"  (1.676 m)   Wt 99.8 kg   LMP 03/05/2022   SpO2 98%   BMI 35.51 kg/m     Wt Readings from Last 3 Encounters:  08/21/22 99.8 kg  08/01/22 100.2 kg  07/15/22 96 kg     GEN:  Well nourished, well developed in no acute distress HEENT: Normal NECK: No JVD; No carotid bruits LYMPHATICS: No  lymphadenopathy CARDIAC: RRR, no murmurs, rubs, gallops RESPIRATORY:  Clear to auscultation without rales, wheezing or rhonchi  ABDOMEN: Soft, non-tender, non-distended MUSCULOSKELETAL:  No edema; No deformity  SKIN: Warm and dry NEUROLOGIC:  Alert and oriented x 3 PSYCHIATRIC:  Normal affect    Risk Assessment/Risk Calculators:                  ASSESSMENT & PLAN:    Orthostatic hypotension   No recurrent symptoms for orthostatic hypotension.  Her blood pressure has improved.  She was advised to start Aspirin 81 mg daily for preeclampsia prophylaxis she has not yet started this , I advised to patient of the importance she is willing to start the aspirin 81 mg daily.  She will get blood pressure cuff and take her blood pressure and send information via my chart in 2 weeks.  The patient is in agreement with the above plan. The patient left the office in stable condition.  The patient will follow up in 8 weeks.   Patient Instructions  Medication Instructions:  Your physician recommends that you continue on your current medications as directed. Please refer to the Current Medication list given to you today.  *If you need a refill on your cardiac medications before your next appointment, please call your pharmacy*   Lab Work: None  Testing/Procedures: None  Follow-Up: At [facility], you and your health needs are our priority.  As part of our continuing mission to provide you with exceptional heart care, we have created designated Provider Care Teams.  These Care Teams include your primary Cardiologist (physician) and Advanced Practice Providers (APPs -  Physician Assistants and Nurse Practitioners) who all work together to provide you with the care you need, when you need it.  We recommend signing up for the patient portal called "MyChart".  Sign up information is provided on this After Visit Summary.  MyChart is used to connect with patients for Virtual Visits  (Telemedicine).  Patients are able to view lab/test results, encounter notes, upcoming appointments, etc.  Non-urgent messages can be sent to your provider as well.   To learn more about what you can do with MyChart, go to [email or web address].    Your next appointment:   March 18th 2024  Provider:   Berniece Salines, DO        Dispo:  No follow-ups on file.   Medication Adjustments/Labs and Tests Ordered:  Current medicines are reviewed at length with the patient today.  Concerns regarding medicines are outlined above.  Tests Ordered: No orders of the defined types were placed in this encounter.  Medication Changes: No orders of the defined types were placed in this encounter.

## 2022-08-27 ENCOUNTER — Ambulatory Visit: Payer: BC Managed Care – PPO | Admitting: *Deleted

## 2022-08-27 ENCOUNTER — Ambulatory Visit: Payer: BC Managed Care – PPO | Attending: Obstetrics and Gynecology

## 2022-08-27 ENCOUNTER — Other Ambulatory Visit: Payer: Self-pay | Admitting: Obstetrics and Gynecology

## 2022-08-27 ENCOUNTER — Encounter: Payer: Self-pay | Admitting: Obstetrics and Gynecology

## 2022-08-27 ENCOUNTER — Other Ambulatory Visit: Payer: Self-pay | Admitting: Maternal & Fetal Medicine

## 2022-08-27 ENCOUNTER — Encounter: Payer: Self-pay | Admitting: *Deleted

## 2022-08-27 DIAGNOSIS — O321XX0 Maternal care for breech presentation, not applicable or unspecified: Secondary | ICD-10-CM | POA: Diagnosis not present

## 2022-08-27 DIAGNOSIS — Z3A23 23 weeks gestation of pregnancy: Secondary | ICD-10-CM | POA: Insufficient documentation

## 2022-08-27 DIAGNOSIS — Z363 Encounter for antenatal screening for malformations: Secondary | ICD-10-CM | POA: Insufficient documentation

## 2022-08-27 DIAGNOSIS — O99212 Obesity complicating pregnancy, second trimester: Secondary | ICD-10-CM | POA: Diagnosis not present

## 2022-08-27 DIAGNOSIS — Z362 Encounter for other antenatal screening follow-up: Secondary | ICD-10-CM

## 2022-08-27 DIAGNOSIS — Z3400 Encounter for supervision of normal first pregnancy, unspecified trimester: Secondary | ICD-10-CM

## 2022-08-27 DIAGNOSIS — O99210 Obesity complicating pregnancy, unspecified trimester: Secondary | ICD-10-CM

## 2022-08-29 ENCOUNTER — Encounter: Payer: Medicaid Other | Admitting: Obstetrics and Gynecology

## 2022-08-30 ENCOUNTER — Encounter: Payer: Self-pay | Admitting: Obstetrics and Gynecology

## 2022-09-02 ENCOUNTER — Ambulatory Visit (INDEPENDENT_AMBULATORY_CARE_PROVIDER_SITE_OTHER): Payer: Medicaid Other | Admitting: Obstetrics and Gynecology

## 2022-09-02 VITALS — BP 135/76 | HR 91 | Wt 222.0 lb

## 2022-09-02 DIAGNOSIS — Z3A24 24 weeks gestation of pregnancy: Secondary | ICD-10-CM

## 2022-09-02 DIAGNOSIS — Z3400 Encounter for supervision of normal first pregnancy, unspecified trimester: Secondary | ICD-10-CM

## 2022-09-02 DIAGNOSIS — O99212 Obesity complicating pregnancy, second trimester: Secondary | ICD-10-CM

## 2022-09-02 DIAGNOSIS — Z6836 Body mass index (BMI) 36.0-36.9, adult: Secondary | ICD-10-CM

## 2022-09-02 DIAGNOSIS — O2341 Unspecified infection of urinary tract in pregnancy, first trimester: Secondary | ICD-10-CM

## 2022-09-02 DIAGNOSIS — O2342 Unspecified infection of urinary tract in pregnancy, second trimester: Secondary | ICD-10-CM

## 2022-09-02 DIAGNOSIS — O98211 Gonorrhea complicating pregnancy, first trimester: Secondary | ICD-10-CM

## 2022-09-02 DIAGNOSIS — O98212 Gonorrhea complicating pregnancy, second trimester: Secondary | ICD-10-CM

## 2022-09-02 DIAGNOSIS — O99210 Obesity complicating pregnancy, unspecified trimester: Secondary | ICD-10-CM

## 2022-09-03 NOTE — Progress Notes (Signed)
   PRENATAL VISIT NOTE  Subjective:  [name] Collier is a 27 y.o. G1P0000 at [redacted]w[redacted]d being seen today for ongoing prenatal care.  She is currently monitored for the following issues for this low-risk pregnancy and has ECZEMA; Vitamin D insufficiency; Constipation; PCOS (polycystic ovarian syndrome); Supervision of normal pregnancy, antepartum; Gonorrhea affecting pregnancy in first trimester; Urinary tract infection in mother during first trimester of pregnancy; Obesity in pregnancy; and BMI 36.0-36.9,adult on their problem list.  Patient reports no complaints.  Contractions: Not present. Vag. Bleeding: None.  Movement: Present. Denies leaking of fluid.   The following portions of the patient's history were reviewed and updated as appropriate: allergies, current medications, past family history, past medical history, past social history, past surgical history and problem list.   Objective:   Vitals:   09/02/22 1535  BP: 135/76  Pulse: 91  Weight: 222 lb (100.7 kg)    Fetal Status: Fetal Heart Rate (bpm): 160   Movement: Present     General:  Alert, oriented and cooperative. Patient is in no acute distress.  Skin: Skin is warm and dry. No rash noted.   Cardiovascular: Normal heart rate noted  Respiratory: Normal respiratory effort, no problems with respiration noted  Abdomen: Soft, gravid, appropriate for gestational age.  Pain/Pressure: Absent     Pelvic: Cervical exam deferred        Extremities: Normal range of motion.  Edema: None  Mental Status: Normal mood and affect. Normal behavior. Normal judgment and thought content.   Assessment and Plan:  Pregnancy: G1P0000 at [redacted]w[redacted]d 1. [redacted] weeks gestation of pregnancy 28wk labs nv Anatomy u/s negative.   2. Urinary tract infection in mother during first trimester of pregnancy Toc neg  3. Supervision of normal first pregnancy, antepartum  4. Obesity in pregnancy Weight stable  5. BMI 36.0-36.9,adult  6. Gonorrhea affecting  pregnancy in first trimester Toc neg  Preterm labor symptoms and general obstetric precautions including but not limited to vaginal bleeding, contractions, leaking of fluid and fetal movement were reviewed in detail with the patient. Please refer to After Visit Summary for other counseling recommendations.   No follow-ups on file.  Future Appointments  Date Time Provider [place]  09/24/2022  3:30 PM [facility] NURSE [facility]  09/24/2022  3:45 PM [facility] US4 [facility]  09/26/2022  8:45 AM [facility] LAB [facility]  09/26/2022  9:35 AM Aletha Halim, MD [facility]  10/14/2022  3:40 PM Tobb, Godfrey Pick, DO [facility] None    Aletha Halim, MD

## 2022-09-18 ENCOUNTER — Inpatient Hospital Stay (HOSPITAL_COMMUNITY)
Admission: AD | Admit: 2022-09-18 | Discharge: 2022-09-18 | Disposition: A | Discharge status: Home / Self Care | Payer: BC Managed Care – PPO | Attending: Obstetrics and Gynecology | Admitting: Obstetrics and Gynecology

## 2022-09-18 ENCOUNTER — Ambulatory Visit (INDEPENDENT_AMBULATORY_CARE_PROVIDER_SITE_OTHER): Payer: BC Managed Care – PPO | Admitting: Family Medicine

## 2022-09-18 ENCOUNTER — Encounter (HOSPITAL_COMMUNITY): Payer: Self-pay | Admitting: Obstetrics and Gynecology

## 2022-09-18 ENCOUNTER — Encounter: Payer: Self-pay | Admitting: Family Medicine

## 2022-09-18 ENCOUNTER — Other Ambulatory Visit (HOSPITAL_COMMUNITY)
Admission: RE | Admit: 2022-09-18 | Discharge: 2022-09-18 | Disposition: A | Discharge status: Home / Self Care | Payer: BC Managed Care – PPO | Source: Ambulatory Visit | Attending: Family Medicine | Admitting: Family Medicine

## 2022-09-18 VITALS — BP 132/81 | HR 98 | Wt 226.0 lb

## 2022-09-18 DIAGNOSIS — M545 Low back pain, unspecified: Secondary | ICD-10-CM | POA: Insufficient documentation

## 2022-09-18 DIAGNOSIS — O4700 False labor before 37 completed weeks of gestation, unspecified trimester: Secondary | ICD-10-CM | POA: Insufficient documentation

## 2022-09-18 DIAGNOSIS — R8271 Bacteriuria: Secondary | ICD-10-CM

## 2022-09-18 DIAGNOSIS — N858 Other specified noninflammatory disorders of uterus: Secondary | ICD-10-CM

## 2022-09-18 DIAGNOSIS — O4702 False labor before 37 completed weeks of gestation, second trimester: Secondary | ICD-10-CM | POA: Diagnosis not present

## 2022-09-18 DIAGNOSIS — Z3400 Encounter for supervision of normal first pregnancy, unspecified trimester: Secondary | ICD-10-CM

## 2022-09-18 DIAGNOSIS — O139 Gestational [pregnancy-induced] hypertension without significant proteinuria, unspecified trimester: Secondary | ICD-10-CM | POA: Insufficient documentation

## 2022-09-18 DIAGNOSIS — Z3A26 26 weeks gestation of pregnancy: Secondary | ICD-10-CM

## 2022-09-18 DIAGNOSIS — O26892 Other specified pregnancy related conditions, second trimester: Secondary | ICD-10-CM | POA: Diagnosis present

## 2022-09-18 DIAGNOSIS — Z8751 Personal history of pre-term labor: Secondary | ICD-10-CM | POA: Insufficient documentation

## 2022-09-18 DIAGNOSIS — R109 Unspecified abdominal pain: Secondary | ICD-10-CM | POA: Diagnosis not present

## 2022-09-18 DIAGNOSIS — O26899 Other specified pregnancy related conditions, unspecified trimester: Secondary | ICD-10-CM

## 2022-09-18 LAB — URINALYSIS, ROUTINE W REFLEX MICROSCOPIC
Bilirubin Urine: NEGATIVE
Glucose, UA: >=500 mg/dL — AB
Hgb urine dipstick: NEGATIVE
Ketones, ur: 5 mg/dL — AB
Leukocytes,Ua: NEGATIVE
Nitrite: NEGATIVE
Protein, ur: NEGATIVE mg/dL
Specific Gravity, Urine: 1.009 (ref 1.005–1.030)
pH: 7 (ref 5.0–8.0)

## 2022-09-18 MED ORDER — NIFEDIPINE ER OSMOTIC RELEASE 30 MG PO TB24: 30.0000 mg | ORAL_TABLET | Freq: Every day | ORAL | 2 refills | Status: DC

## 2022-09-18 MED ORDER — NIFEDIPINE 10 MG PO CAPS
10.0000 mg | ORAL_CAPSULE | Freq: Once | ORAL | Status: AC
  Administered 2022-09-18: 10 mg via ORAL
  Filled 2022-09-18: qty 1

## 2022-09-18 MED ORDER — BETAMETHASONE SOD PHOS & ACET 6 (3-3) MG/ML IJ SUSP
12.0000 mg | Freq: Once | INTRAMUSCULAR | Status: AC
  Administered 2022-09-18: 12 mg via INTRAMUSCULAR

## 2022-09-18 MED ORDER — CEFADROXIL 500 MG PO CAPS: 500.0000 mg | ORAL_CAPSULE | Freq: Two times a day (BID) | ORAL | 0 refills | Status: AC

## 2022-09-18 NOTE — Progress Notes (Signed)
   PRENATAL VISIT NOTE  Subjective:  [name] Collier is a 27 y.o. G1P0000 at 47w5dbeing seen today for ongoing prenatal care.  She is currently monitored for the following issues for this low-risk pregnancy and has ECZEMA; Vitamin D insufficiency; Constipation; PCOS (polycystic ovarian syndrome); Supervision of normal pregnancy, antepartum; Gonorrhea affecting pregnancy in first trimester; Urinary tract infection in mother during first trimester of pregnancy; Obesity in pregnancy; and BMI 36.0-36.9,adult on their problem list.  Patient reports contractions since last night and leaking clear fluid, contractions are worse with movement.  Contractions: Irritability. Vag. Bleeding: None.  Movement: Present. Denies leaking of fluid.   The following portions of the patient's history were reviewed and updated as appropriate: allergies, current medications, past family history, past medical history, past social history, past surgical history and problem list.   Objective:   Vitals:   09/18/22 1051  BP: 132/81  Pulse: 98  Weight: 226 lb (102.5 kg)    Fetal Status: Fetal Heart Rate (bpm): 155 Fundal Height: 26 cm Movement: Present     General:  Alert, oriented and cooperative. Patient is in no acute distress.  Skin: Skin is warm and dry. No rash noted.   Cardiovascular: Normal heart rate noted  Respiratory: Normal respiratory effort, no problems with respiration noted  Abdomen: Soft, gravid, appropriate for gestational age.  Pain/Pressure: Present     Pelvic: Cervical exam performed in the presence of a chaperone Dilation: Closed Effacement (%): 50 Station: -2, -3 0.5 cm external os, but could not push through to presenting part, neg pool, neg fern, neg nitrazine  Extremities: Normal range of motion.  Edema: None  Mental Status: Normal mood and affect. Normal behavior. Normal judgment and thought content.   Assessment and Plan:  Pregnancy: G1P0000 at 294w5d. Supervision of normal first  pregnancy, antepartum   2. Threatened preterm labor, antepartum Try to work from home if able BMZ x 2 Reduce stress PTL precautions. - NIFEdipine (PROCARDIA XL) 30 MG 24 hr tablet; Take 1 tablet (30 mg total) by mouth daily.  Dispense: 60 tablet; Refill: 2 - Cervicovaginal ancillary only( [place]) - betamethasone acetate-betamethasone sodium phosphate (CELESTONE) injection 12 mg  Preterm labor symptoms and general obstetric precautions including but not limited to vaginal bleeding, contractions, leaking of fluid and fetal movement were reviewed in detail with the patient. Please refer to After Visit Summary for other counseling recommendations.   Return in 1 week (on 09/25/2022).  Future Appointments  Date Time Provider [place]/22/20[place] 10:45 AM [facility] NURSE [facility]  09/24/2022  3:30 PM [facility]/27[facility]  3:45 PM [facility]/29/2024  8:45 AM [facility] LAB [facility]  09/26/2022  9:35 AM PiAletha HalimMD [facility]  10/14/2022  3:40 PM Tobb, KaGodfrey PickDO [facility] None    TaDonnamae JudeMD

## 2022-09-18 NOTE — MAU Note (Signed)
.  [name] Collier is a 27 y.o. at 42w5dhere in MAU reporting: pt was seen in her OB office and was evaluated for preterm labor and leaking fluid that started yesterday. [facility] 1 cm and given BMZ in the office.  She was given Procardia twice daily, last dose at 1300 today. Pt reports CTX around 1945 again today, and patient feels like her "body is in distress, just doesn't feel right". Pt states she was told to come to MAU if pain continued. PT denies VB, LOF, abnormal discharge, PIH s/s, recent intercourse. GHTN not on medication  Onset of complaint: 1945 Pain score: 7/10 Vitals:   09/18/22 2012  BP: (!) 143/81  Pulse: 100  Resp: 18  Temp: 98.1 F (36.7 C)  SpO2: 100%     FHT:165 Lab orders placed from triage:  UA

## 2022-09-18 NOTE — Progress Notes (Signed)
ROB   CC: Discharge and leaking fluid  Around 5-6 pm last night  had cramping, discomfort, tightness and back pain.  Pt noticed leaking today that is clear. Denies any bleeding. Notices increase in Fetal movement "more than usual" pt states she does not feel right.   Pt will do cervix check today.   Pt had nose bleeds was not sure if asa was causing reaction stopped taking it.

## 2022-09-18 NOTE — MAU Provider Note (Signed)
Chief Complaint:  No chief complaint on file.   Event Date/Time   First Provider Initiated Contact with Patient 09/18/22 2031     HPI: [name] Collier is a 27 y.o. G1P0000 at 67w5dho presents to maternity admissions reporting uterine cramping and tightening. Starts as low back pain and wraps around. Took her Procardia and went to sleep but when she woke up they were cramping again.  Seen in office today and found to be external os dilated FT.  Internal os was closed.  Was given BMZ today.. She reports good fetal movement, denies LOF, vaginal bleeding, vaginal itching/burning, urinary symptoms, h/a, dizziness, n/v, diarrhea, constipation or fever/chills.   Abdominal Pain This is a recurrent problem. The current episode started in the past 7 days. The problem occurs intermittently. The problem has been unchanged. The quality of the pain is cramping and dull. Pertinent negatives include no constipation, diarrhea, dysuria, fever or frequency. Nothing aggravates the pain. The pain is relieved by Nothing. Treatments tried: Took the Procardia XL she was prescribed. The treatment provided mild relief.   RN Note: [name] CANALA GALMOREis a 27y.o. at 250w5dere in MAU reporting: pt was seen in her OB office and was evaluated for preterm labor and leaking fluid that started yesterday. [facility] 1 cm and given BMZ in the office.  She was given Procardia twice daily, last dose at 1300 today. Pt reports CTX around 1945 again today, and patient feels like her "body is in distress, just doesn't feel right". Pt states she was told to come to MAU if pain continued. PT denies VB, LOF, abnormal discharge, PIH s/s, recent intercourse. GHTN not on medication   Onset of complaint: 1945 Pain score: 7/10  Past Medical History: Past Medical History:  Diagnosis Date   Anemia    Asthma    Blood in stool    Dysfunctional uterine bleeding 07/01/2013   Headache    PCOS (polycystic  ovarian syndrome)    Vaginal Pap smear, abnormal     Past obstetric history: OB History  Gravida Para Term Preterm AB Living  1 0 0 0 0 0  SAB IAB Ectopic Multiple Live Births  0 0 0 0      # Outcome Date GA Lbr Len/2nd Weight Sex Delivery Anes PTL Lv  1 Current             Past Surgical History: Past Surgical History:  Procedure Laterality Date   TONSILLECTOMY     TYMPANOSTOMY TUBE PLACEMENT      Family History: Family History  Problem Relation Age of Onset   Epilepsy Mother    Healthy Father     Social History: Social History   Tobacco Use   Smoking status: Never   Smokeless tobacco: Never  Vaping Use   Vaping Use: Never used  Substance Use Topics   Alcohol use: No    Alcohol/week: 0.0 standard drinks of alcohol   Drug use: No    Allergies:  Allergies  Allergen Reactions   Black Walnut Flavor Anaphylaxis   Cherry Anaphylaxis and Rash    Tongue tingling   Peach [Prunus Persica] Anaphylaxis and Rash    Tongue tingling   Peanut-Containing Drug Products Anaphylaxis   Almond (Diagnostic) Other (See Comments)    Tongue tingling   BrBoliviaut (Berthollefia ExC[place] RepublicSkin Test    Cashew Nut (Anacardium Occidentale) Skin Test    Macadamia Nut Oil  Pistachio Nut (Diagnostic)     Meds:  Medications Prior to Admission  Medication Sig Dispense Refill Last Dose   albuterol (PROVENTIL) (2.5 MG/3ML) 0.083% nebulizer solution Take 3 mLs (2.5 mg total) by nebulization every 6 (six) hours as needed for wheezing or shortness of breath. 75 mL 12    aspirin EC 81 MG tablet Take 1 tablet (81 mg total) by mouth daily. 60 tablet 2    cholecalciferol (VITAMIN D3) 25 MCG (1000 UNIT) tablet Take 1,000 Units by mouth daily. (Patient not taking: Reported on 09/18/2022)      NIFEdipine (PROCARDIA XL) 30 MG 24 hr tablet Take 1 tablet (30 mg total) by mouth daily. 60 tablet 2    Prenat-FeFum-FePo-FA-Omega 3 (TARON-C DHA) 35-1 MG CAPS Take 1 capsule by mouth daily.       I have  reviewed patient's Past Medical Hx, Surgical Hx, Family Hx, Social Hx, medications and allergies.   ROS:  Review of Systems  Constitutional:  Negative for fever.  Gastrointestinal:  Positive for abdominal pain. Negative for constipation and diarrhea.  Genitourinary:  Negative for dysuria and frequency.   Other systems negative  Physical Exam  Patient Vitals for the past 24 hrs:  BP Temp Temp src Pulse Resp SpO2 Height Weight  09/18/22 2012 (!) 143/81 98.1 F (36.7 C) Oral 100 18 100 % -- --  09/18/22 2010 -- -- -- -- -- -- 5' 6"$  (1.676 m) 103.2 kg   Constitutional: Well-developed, well-nourished female in no acute distress.  Cardiovascular: normal rate  Respiratory: normal effort GI: Abd soft, non-tender, gravid appropriate for gestational age.   No rebound or guarding. MS: Extremities nontender, no edema, normal ROM Neurologic: Alert and oriented x 4.  GU: Neg CVAT.  PELVIC EXAM:   Dilation: Closed Effacement (%): 40 Exam by:: Hansel Feinstein, CNM   FHT:  Baseline 150 , moderate variability, accelerations present, no decelerations Contractions: TIny cramps lasting 5-10seconds each, uterine irritability   Labs: Results for orders placed or performed during the hospital encounter of 09/18/22 (from the past 24 hour(s))  Urinalysis, Routine w reflex microscopic -Urine, Clean Catch     Status: Abnormal   Collection Time: 09/18/22  8:54 PM  Result Value Ref Range   Color, Urine YELLOW YELLOW   APPearance CLEAR CLEAR   Specific Gravity, Urine 1.009 1.005 - 1.030   pH 7.0 5.0 - 8.0   Glucose, UA >=500 (A) NEGATIVE mg/dL   Hgb urine dipstick NEGATIVE NEGATIVE   Bilirubin Urine NEGATIVE NEGATIVE   Ketones, ur 5 (A) NEGATIVE mg/dL   Protein, ur NEGATIVE NEGATIVE mg/dL   Nitrite NEGATIVE NEGATIVE   Leukocytes,Ua NEGATIVE NEGATIVE   RBC / HPF 0-5 0 - 5 RBC/hpf   WBC, UA 0-5 0 - 5 WBC/hpf   Bacteria, UA MANY (A) NONE SEEN   Squamous Epithelial / HPF 0-5 0 - 5 /HPF   Mucus  PRESENT     O/Positive/-- (10/17 0000)  Imaging:    MAU Course/MDM: I have reviewed the triage vital signs and the nursing notes.   Pertinent labs & imaging results that were available during my care of the patient were reviewed by me and considered in my medical decision making (see chart for details).      I have reviewed her medical records including past results, notes and treatments.   I have ordered labs and reviewed results. Urine has many bacteria, no squ Epith (so good clean catch) > will send to culture.  With hx  GC, will test on urine.  NST reviewed  Treatments in MAU included Offered Procardia (short acting) x 1 dose which helped but did not eliminate cramps.  Declined Terb. Discussed unchanged cervix and that irritability not changing cervix.  Taking Procardia as directed (order states daily but pt states Dr Kennon Rounds told her BID).  Will treat for UTI due to UA findings.    Assessment: Single IUP at 34w5dUterine irritability Hx Preterm labor, s/p BMZ Bacteruria Hx GC  Plan: Discharge home Rx Duracef for possible UTI Urine to culture Urine for GC/Chlamydia Preterm Labor precautions and fetal kick counts Follow up in Office for prenatal visits and recheck Encouraged to return if she develops worsening of symptoms, increase in pain, fever, or other concerning symptoms.   Pt stable at time of discharge.  MHansel FeinsteinCNM, MSN Certified Nurse-Midwife 09/18/2022 8:31 PM

## 2022-09-19 ENCOUNTER — Ambulatory Visit (INDEPENDENT_AMBULATORY_CARE_PROVIDER_SITE_OTHER): Payer: BC Managed Care – PPO

## 2022-09-19 DIAGNOSIS — O4703 False labor before 37 completed weeks of gestation, third trimester: Secondary | ICD-10-CM

## 2022-09-19 DIAGNOSIS — O4700 False labor before 37 completed weeks of gestation, unspecified trimester: Secondary | ICD-10-CM

## 2022-09-19 DIAGNOSIS — Z3A28 28 weeks gestation of pregnancy: Secondary | ICD-10-CM

## 2022-09-19 LAB — CERVICOVAGINAL ANCILLARY ONLY
Bacterial Vaginitis (gardnerella): NEGATIVE
Candida Glabrata: NEGATIVE
Candida Vaginitis: NEGATIVE
Chlamydia: NEGATIVE
Comment: NEGATIVE
Comment: NEGATIVE
Comment: NEGATIVE
Comment: NEGATIVE
Comment: NEGATIVE
Comment: NORMAL
Neisseria Gonorrhea: NEGATIVE
Trichomonas: NEGATIVE

## 2022-09-19 MED ORDER — BETAMETHASONE SOD PHOS & ACET 6 (3-3) MG/ML IJ SUSP
12.0000 mg | Freq: Once | INTRAMUSCULAR | Status: AC
  Administered 2022-09-19: 12 mg via INTRAMUSCULAR

## 2022-09-19 NOTE — Progress Notes (Signed)
Pt here to receive her 2nd dose of betamethasone.

## 2022-09-20 LAB — CULTURE, OB URINE

## 2022-09-24 ENCOUNTER — Ambulatory Visit: Payer: BC Managed Care – PPO

## 2022-09-24 ENCOUNTER — Ambulatory Visit: Payer: BC Managed Care – PPO | Attending: Maternal & Fetal Medicine

## 2022-09-24 VITALS — BP 137/73 | HR 80

## 2022-09-24 DIAGNOSIS — O4702 False labor before 37 completed weeks of gestation, second trimester: Secondary | ICD-10-CM | POA: Diagnosis not present

## 2022-09-24 DIAGNOSIS — Z3400 Encounter for supervision of normal first pregnancy, unspecified trimester: Secondary | ICD-10-CM

## 2022-09-24 DIAGNOSIS — Z362 Encounter for other antenatal screening follow-up: Secondary | ICD-10-CM | POA: Diagnosis present

## 2022-09-24 DIAGNOSIS — E669 Obesity, unspecified: Secondary | ICD-10-CM | POA: Diagnosis not present

## 2022-09-24 DIAGNOSIS — O99212 Obesity complicating pregnancy, second trimester: Secondary | ICD-10-CM

## 2022-09-24 DIAGNOSIS — Z3A27 27 weeks gestation of pregnancy: Secondary | ICD-10-CM

## 2022-09-24 DIAGNOSIS — Z363 Encounter for antenatal screening for malformations: Secondary | ICD-10-CM | POA: Insufficient documentation

## 2022-09-24 DIAGNOSIS — O99210 Obesity complicating pregnancy, unspecified trimester: Secondary | ICD-10-CM

## 2022-09-25 ENCOUNTER — Other Ambulatory Visit: Payer: Self-pay | Admitting: *Deleted

## 2022-09-25 DIAGNOSIS — O99212 Obesity complicating pregnancy, second trimester: Secondary | ICD-10-CM

## 2022-09-26 ENCOUNTER — Ambulatory Visit (INDEPENDENT_AMBULATORY_CARE_PROVIDER_SITE_OTHER): Payer: BC Managed Care – PPO | Admitting: Obstetrics and Gynecology

## 2022-09-26 ENCOUNTER — Other Ambulatory Visit: Payer: Medicaid Other

## 2022-09-26 VITALS — BP 138/85 | HR 93 | Wt 226.0 lb

## 2022-09-26 DIAGNOSIS — O99212 Obesity complicating pregnancy, second trimester: Secondary | ICD-10-CM

## 2022-09-26 DIAGNOSIS — Z3400 Encounter for supervision of normal first pregnancy, unspecified trimester: Secondary | ICD-10-CM

## 2022-09-26 DIAGNOSIS — O98211 Gonorrhea complicating pregnancy, first trimester: Secondary | ICD-10-CM

## 2022-09-26 DIAGNOSIS — Z23 Encounter for immunization: Secondary | ICD-10-CM

## 2022-09-26 DIAGNOSIS — O98212 Gonorrhea complicating pregnancy, second trimester: Secondary | ICD-10-CM

## 2022-09-26 DIAGNOSIS — O4700 False labor before 37 completed weeks of gestation, unspecified trimester: Secondary | ICD-10-CM

## 2022-09-26 DIAGNOSIS — O2341 Unspecified infection of urinary tract in pregnancy, first trimester: Secondary | ICD-10-CM

## 2022-09-26 DIAGNOSIS — Z6836 Body mass index (BMI) 36.0-36.9, adult: Secondary | ICD-10-CM

## 2022-09-26 DIAGNOSIS — Z3A27 27 weeks gestation of pregnancy: Secondary | ICD-10-CM | POA: Diagnosis not present

## 2022-09-26 DIAGNOSIS — O99210 Obesity complicating pregnancy, unspecified trimester: Secondary | ICD-10-CM

## 2022-09-26 DIAGNOSIS — O132 Gestational [pregnancy-induced] hypertension without significant proteinuria, second trimester: Secondary | ICD-10-CM | POA: Diagnosis not present

## 2022-09-26 DIAGNOSIS — O2342 Unspecified infection of urinary tract in pregnancy, second trimester: Secondary | ICD-10-CM

## 2022-09-26 NOTE — Progress Notes (Signed)
   PRENATAL VISIT NOTE  Subjective:  [name] Collier is a 27 y.o. G1P0000 at 50w6dbeing seen today for ongoing prenatal care.  She is currently monitored for the following issues for this high-risk pregnancy and has ECZEMA; Vitamin D insufficiency; Constipation; PCOS (polycystic ovarian syndrome); Supervision of normal pregnancy, antepartum; Gonorrhea affecting pregnancy in first trimester; Urinary tract infection in mother during first trimester of pregnancy; Obesity in pregnancy; BMI 36.0-36.9,adult; Transient hypertension of pregnancy in second trimester; and Threatened preterm labor, antepartum on their problem list.  Patient reports no complaints.  Contractions: Irritability. Vag. Bleeding: None.  Movement: Present. Denies leaking of fluid.   The following portions of the patient's history were reviewed and updated as appropriate: allergies, current medications, past family history, past medical history, past social history, past surgical history and problem list.   Objective:   Vitals:   09/26/22 0930  BP: 138/85  Pulse: 93  Weight: 226 lb (102.5 kg)    Fetal Status: Fetal Heart Rate (bpm): 162   Movement: Present     General:  Alert, oriented and cooperative. Patient is in no acute distress.  Skin: Skin is warm and dry. No rash noted.   Cardiovascular: Normal heart rate noted  Respiratory: Normal respiratory effort, no problems with respiration noted  Abdomen: Soft, gravid, appropriate for gestational age.  Pain/Pressure: Absent     Pelvic: Cervical exam performed in the presence of a chaperone Dilation: Closed Effacement (%): Thick Station: Ballotable  Extremities: Normal range of motion.  Edema: None  Mental Status: Normal mood and affect. Normal behavior. Normal judgment and thought content.   Assessment and Plan:  Pregnancy: G1P0000 at 249w6d. Transient hypertension of pregnancy in second trimester One mild range BP during OB triage eval. Normal today.   2. Threatened  preterm labor, antepartum 2/21-22 BMZ for contractions but negative cervical exam; cervix unchanged today  3. Gonorrhea affecting pregnancy in first trimester Toc neg  4. Obesity in pregnancy stable  5. BMI 36.0-36.9,adult stable  6. Urinary tract infection in mother during first trimester of pregnancy Toc neg  7. Pregnancy Normal growth on 2/27: 74%, 1236gm, ac 73%, afi wnl 28wk labs today Has f/u with Dr. ToHarriet Massonor ?orthostatic hypotension  Preterm labor symptoms and general obstetric precautions including but not limited to vaginal bleeding, contractions, leaking of fluid and fetal movement were reviewed in detail with the patient. Please refer to After Visit Summary for other counseling recommendations.   No follow-ups on file.  Future Appointments  Date Time Provider [place]3/18/2024  2:30 PM Anyanwu, UgSallyanne HaversMD [facility]  10/14/2022  3:40 PM Tobb, KaGodfrey PickDO [facility] None  10/28/2022  3:30 PM Anyanwu, UgSallyanne HaversMD [facility]  10/30/2022  3:15 PM [facility]20[facility]  3:30 PM [facility]/16/2024  3:30 PM Anyanwu, UgSallyanne HaversMD [facility]  11/26/2022  3:30 PM Anyanwu, UgSallyanne HaversMD [facility]    ChAletha HalimMD

## 2022-09-27 DIAGNOSIS — H5213 Myopia, bilateral: Secondary | ICD-10-CM | POA: Diagnosis not present

## 2022-09-27 LAB — HIV ANTIBODY (ROUTINE TESTING W REFLEX): HIV Screen 4th Generation wRfx: NONREACTIVE

## 2022-09-27 LAB — GLUCOSE TOLERANCE, 2 HOURS W/ 1HR
Glucose, 1 hour: 172 mg/dL (ref 70–179)
Glucose, 2 hour: 136 mg/dL (ref 70–152)
Glucose, Fasting: 91 mg/dL (ref 70–91)

## 2022-09-27 LAB — CBC
Hematocrit: 35.4 % (ref 34.0–46.6)
Hemoglobin: 11.4 g/dL (ref 11.1–15.9)
MCH: 26.1 pg — ABNORMAL LOW (ref 26.6–33.0)
MCHC: 32.2 g/dL (ref 31.5–35.7)
MCV: 81 fL (ref 79–97)
Platelets: 186 10*3/uL (ref 150–450)
RBC: 4.37 x10E6/uL (ref 3.77–5.28)
RDW: 14.5 % (ref 11.7–15.4)
WBC: 8.5 10*3/uL (ref 3.4–10.8)

## 2022-09-27 LAB — RPR: RPR Ser Ql: NONREACTIVE

## 2022-10-03 ENCOUNTER — Inpatient Hospital Stay (HOSPITAL_COMMUNITY)
Admission: AD | Admit: 2022-10-03 | Discharge: 2022-10-03 | Disposition: A | Discharge status: Home / Self Care | Payer: BC Managed Care – PPO | Attending: Obstetrics and Gynecology | Admitting: Obstetrics and Gynecology

## 2022-10-03 ENCOUNTER — Other Ambulatory Visit: Payer: Self-pay

## 2022-10-03 ENCOUNTER — Encounter (HOSPITAL_COMMUNITY): Payer: Self-pay | Admitting: Obstetrics and Gynecology

## 2022-10-03 DIAGNOSIS — M7989 Other specified soft tissue disorders: Secondary | ICD-10-CM | POA: Diagnosis not present

## 2022-10-03 DIAGNOSIS — Z3A28 28 weeks gestation of pregnancy: Secondary | ICD-10-CM | POA: Diagnosis not present

## 2022-10-03 DIAGNOSIS — O26893 Other specified pregnancy related conditions, third trimester: Secondary | ICD-10-CM | POA: Insufficient documentation

## 2022-10-03 DIAGNOSIS — Z3400 Encounter for supervision of normal first pregnancy, unspecified trimester: Secondary | ICD-10-CM

## 2022-10-03 DIAGNOSIS — Z8489 Family history of other specified conditions: Secondary | ICD-10-CM | POA: Diagnosis not present

## 2022-10-03 DIAGNOSIS — O132 Gestational [pregnancy-induced] hypertension without significant proteinuria, second trimester: Secondary | ICD-10-CM | POA: Diagnosis not present

## 2022-10-03 LAB — CBC
HCT: 33.2 % — ABNORMAL LOW (ref 36.0–46.0)
Hemoglobin: 11.1 g/dL — ABNORMAL LOW (ref 12.0–15.0)
MCH: 26.7 pg (ref 26.0–34.0)
MCHC: 33.4 g/dL (ref 30.0–36.0)
MCV: 79.8 fL — ABNORMAL LOW (ref 80.0–100.0)
Platelets: 174 10*3/uL (ref 150–400)
RBC: 4.16 MIL/uL (ref 3.87–5.11)
RDW: 14.6 % (ref 11.5–15.5)
WBC: 9.3 10*3/uL (ref 4.0–10.5)
nRBC: 0 % (ref 0.0–0.2)

## 2022-10-03 LAB — COMPREHENSIVE METABOLIC PANEL
ALT: 14 U/L (ref 0–44)
AST: 22 U/L (ref 15–41)
Albumin: 3 g/dL — ABNORMAL LOW (ref 3.5–5.0)
Alkaline Phosphatase: 65 U/L (ref 38–126)
Anion gap: 9 (ref 5–15)
BUN: <5 mg/dL — ABNORMAL LOW (ref 6–20)
CO2: 20 mmol/L — ABNORMAL LOW (ref 22–32)
Calcium: 9 mg/dL (ref 8.9–10.3)
Chloride: 106 mmol/L (ref 98–111)
Creatinine, Ser: 0.53 mg/dL (ref 0.44–1.00)
GFR, Estimated: >60 mL/min (ref >60)
Glucose, Bld: 133 mg/dL — ABNORMAL HIGH (ref 70–99)
Potassium: 3.8 mmol/L (ref 3.5–5.1)
Sodium: 135 mmol/L (ref 135–145)
Total Bilirubin: 0.2 mg/dL — ABNORMAL LOW (ref 0.3–1.2)
Total Protein: 6.2 g/dL — ABNORMAL LOW (ref 6.5–8.1)

## 2022-10-03 LAB — URINALYSIS, ROUTINE W REFLEX MICROSCOPIC
Bilirubin Urine: NEGATIVE
Glucose, UA: NEGATIVE mg/dL
Hgb urine dipstick: NEGATIVE
Ketones, ur: 20 mg/dL — AB
Leukocytes,Ua: NEGATIVE
Nitrite: NEGATIVE
Protein, ur: NEGATIVE mg/dL
Specific Gravity, Urine: 1.011 (ref 1.005–1.030)
pH: 6 (ref 5.0–8.0)

## 2022-10-03 LAB — PROTEIN / CREATININE RATIO, URINE
Creatinine, Urine: 65 mg/dL
Protein Creatinine Ratio: 0.15 mg/mg{Cre} (ref 0.00–0.15)
Total Protein, Urine: 10 mg/dL

## 2022-10-03 NOTE — MAU Note (Signed)
RN called lab to inquire about protein/creatinine ratio status. Lab tech answered and reported that they had the sample and would run now.

## 2022-10-03 NOTE — MAU Provider Note (Signed)
History     CSN: [id number]  Arrival date and time: 10/03/22 1025   Event Date/Time   First Provider Initiated Contact with Patient 10/03/22 1101      Chief Complaint  Patient presents with   BP Evaluation   [name] Collier is a 27 y.o. G1P0000 at 51w6dwho presents for elevated blood pressure at work. She states she woke up this morning feeling unlike herself. Her hands felt tight and swollen. She felt the same going to work and went to take her blood pressure and got a systolic of [id number] She endorses +FM. She denies VB, LOF, headache, vision changes, and RUQ pain. Of note, her mother and sister has a history of pre-eclampsia requiring early deliveries. She has been unable to take ASA due to recurrent nose bleeds.    OB History     Gravida  1   Para  0   Term  0   Preterm  0   AB  0   Living  0      SAB  0   IAB  0   Ectopic  0   Multiple  0   Live Births              Past Medical History:  Diagnosis Date   Anemia    Asthma    Blood in stool    Dysfunctional uterine bleeding 07/01/2013   Headache    PCOS (polycystic ovarian syndrome)    Vaginal Pap smear, abnormal     Past Surgical History:  Procedure Laterality Date   TONSILLECTOMY     TYMPANOSTOMY TUBE PLACEMENT      Family History  Problem Relation Age of Onset   Epilepsy Mother    Healthy Father     Social History   Tobacco Use   Smoking status: Never   Smokeless tobacco: Never  Vaping Use   Vaping Use: Never used  Substance Use Topics   Alcohol use: No    Alcohol/week: 0.0 standard drinks of alcohol   Drug use: No    Allergies:  Allergies  Allergen Reactions   Black Walnut Flavor Anaphylaxis   Cherry Anaphylaxis and Rash    Tongue tingling   Peach [Prunus Persica] Anaphylaxis and Rash    Tongue tingling   Peanut-Containing Drug Products Anaphylaxis   Almond (Diagnostic) Other (See Comments)    Tongue tingling   BBoliviaNut (Berthollefia [place]    Cashew Nut  (Anacardium Occidentale) Skin Test    Macadamia Nut Oil    Pistachio Nut (Diagnostic)     Medications Prior to Admission  Medication Sig Dispense Refill Last Dose   NIFEdipine (PROCARDIA XL) 30 MG 24 hr tablet Take 1 tablet (30 mg total) by mouth daily. 60 tablet 2 Past Week   Prenat-FeFum-FePo-FA-Omega 3 (TARON-C DHA) 35-1 MG CAPS Take 1 capsule by mouth daily.   10/02/2022   albuterol (PROVENTIL) (2.5 MG/3ML) 0.083% nebulizer solution Take 3 mLs (2.5 mg total) by nebulization every 6 (six) hours as needed for wheezing or shortness of breath. 75 mL 12    cholecalciferol (VITAMIN D3) 25 MCG (1000 UNIT) tablet Take 1,000 Units by mouth daily.       Review of Systems  Constitutional:  Negative for appetite change and fever.  HENT:  Negative for congestion and sore throat.   Eyes:  Negative for visual disturbance.  Respiratory:  Negative for cough and shortness of breath.   Cardiovascular:  Negative for chest pain.  Gastrointestinal:  Positive for constipation. Negative for abdominal pain.  Genitourinary:  Negative for difficulty urinating, dysuria and vaginal bleeding.  Musculoskeletal:  Negative for arthralgias.  Neurological:  Positive for light-headedness. Negative for dizziness and headaches.  Psychiatric/Behavioral:  Negative for sleep disturbance.    Physical Exam   Blood pressure 130/66, pulse 99, temperature 97.9 F (36.6 C), temperature source Oral, resp. rate 19, height '5\' 6"'$  (1.676 m), weight 104.1 kg, last menstrual period 03/05/2022, SpO2 100 %. NST:  Baseline: 150 bpm, Variability: Good {> 6 bpm), Accelerations: Reactive, Decelerations: Absent, and without contractions  Physical Exam Vitals reviewed.  Constitutional:      General: She is not in acute distress.    Appearance: She is not ill-appearing, toxic-appearing or diaphoretic.  HENT:     Head: Normocephalic.  Eyes:     Extraocular Movements: Extraocular movements intact.     Conjunctiva/sclera: Conjunctivae  normal.  Cardiovascular:     Rate and Rhythm: Normal rate and regular rhythm.     Heart sounds: Murmur heard.     Comments: Murmur is loudest at LSB Pulmonary:     Effort: Pulmonary effort is normal.     Breath sounds: Normal breath sounds.  Abdominal:     Palpations: Abdomen is soft.     Tenderness: There is no abdominal tenderness. There is no guarding.     Comments: gravid  Musculoskeletal:        General: No swelling or tenderness.     Right lower leg: Edema present.     Left lower leg: No edema.  Neurological:     Mental Status: She is alert and oriented to person, place, and time.  Psychiatric:        Mood and Affect: Mood normal.        Behavior: Behavior normal.     MAU Course  Procedures  MDM Results for orders placed or performed during the hospital encounter of 10/03/22 (from the past 24 hour(s))  Urinalysis, Routine w reflex microscopic -Urine, Clean Catch     Status: Abnormal   Collection Time: 10/03/22 11:10 AM  Result Value Ref Range   Color, Urine YELLOW YELLOW   APPearance CLEAR CLEAR   Specific Gravity, Urine 1.011 1.005 - 1.030   pH 6.0 5.0 - 8.0   Glucose, UA NEGATIVE NEGATIVE mg/dL   Hgb urine dipstick NEGATIVE NEGATIVE   Bilirubin Urine NEGATIVE NEGATIVE   Ketones, ur 20 (A) NEGATIVE mg/dL   Protein, ur NEGATIVE NEGATIVE mg/dL   Nitrite NEGATIVE NEGATIVE   Leukocytes,Ua NEGATIVE NEGATIVE  Protein / creatinine ratio, urine     Status: None   Collection Time: 10/03/22 11:10 AM  Result Value Ref Range   Creatinine, Urine 65 mg/dL   Total Protein, Urine 10 mg/dL   Protein Creatinine Ratio 0.15 0.00 - 0.15 mg/mg[Cre]  CBC     Status: Abnormal   Collection Time: 10/03/22 11:38 AM  Result Value Ref Range   WBC 9.3 4.0 - 10.5 K/uL   RBC 4.16 3.87 - 5.11 MIL/uL   Hemoglobin 11.1 (L) 12.0 - 15.0 g/dL   HCT 33.2 (L) 36.0 - 46.0 %   MCV 79.8 (L) 80.0 - 100.0 fL   MCH 26.7 26.0 - 34.0 pg   MCHC 33.4 30.0 - 36.0 g/dL   RDW 14.6 11.5 - 15.5 %    Platelets 174 150 - 400 K/uL   nRBC 0.0 0.0 - 0.2 %  Comprehensive metabolic panel     Status: Abnormal   Collection Time:  10/03/22 11:38 AM  Result Value Ref Range   Sodium 135 135 - 145 mmol/L   Potassium 3.8 3.5 - 5.1 mmol/L   Chloride 106 98 - 111 mmol/L   CO2 20 (L) 22 - 32 mmol/L   Glucose, Bld 133 (H) 70 - 99 mg/dL   BUN <5 (L) 6 - 20 mg/dL   Creatinine, Ser 0.53 0.44 - 1.00 mg/dL   Calcium 9.0 8.9 - 10.3 mg/dL   Total Protein 6.2 (L) 6.5 - 8.1 g/dL   Albumin 3.0 (L) 3.5 - 5.0 g/dL   AST 22 15 - 41 U/L   ALT 14 0 - 44 U/L   Alkaline Phosphatase 65 38 - 126 U/L   Total Bilirubin 0.2 (L) 0.3 - 1.2 mg/dL   GFR, Estimated >60 >60 mL/min   Anion gap 9 5 - 15     Assessment and Plan  1. Transient hypertension of pregnancy in second trimester Mild range BP readings in the past. Elevated here today. Significant family history of preE.  -No medications for HTN, has procardia PRN for intermittent contractions. Has follow up scheduled with OB cards.  - Nml PreE labs today - Discussed awareness of stress level at work since her BP seems to rise when at work -MAU/preE return precautions.   2. Supervision of normal first pregnancy, antepartum  3. [redacted] weeks gestation of pregnancy      [place] Autry-Lott 10/03/2022, 11:01 AM

## 2022-10-03 NOTE — MAU Note (Signed)
[name] Collier is a 27 y.o. at 80w6dhere in MAU reporting: she's not feeling like herself. Reports has felt light headed and dizzy since waking up.  States had BP taken @ work, BP 150/83.  Denies HA, visual disturbances, and epigastric pain.  Endorses +FM.  Denies VB or LOF. LMP: NA Onset of complaint: today Pain score: 0 Vitals:   10/03/22 1038  BP: 134/64  Pulse: 94  Resp: 19  Temp: 97.9 F (36.6 C)  SpO2: 100%     FHT:152 bpm Lab orders placed from triage:   UA

## 2022-10-11 ENCOUNTER — Inpatient Hospital Stay (HOSPITAL_COMMUNITY)
Admission: AD | Admit: 2022-10-11 | Discharge: 2022-10-11 | Disposition: A | Discharge status: Home / Self Care | Payer: BC Managed Care – PPO | Attending: Family Medicine | Admitting: Family Medicine

## 2022-10-11 ENCOUNTER — Encounter (HOSPITAL_COMMUNITY): Payer: Self-pay | Admitting: Family Medicine

## 2022-10-11 DIAGNOSIS — E282 Polycystic ovarian syndrome: Secondary | ICD-10-CM | POA: Diagnosis not present

## 2022-10-11 DIAGNOSIS — O133 Gestational [pregnancy-induced] hypertension without significant proteinuria, third trimester: Secondary | ICD-10-CM

## 2022-10-11 DIAGNOSIS — Z79899 Other long term (current) drug therapy: Secondary | ICD-10-CM | POA: Diagnosis not present

## 2022-10-11 DIAGNOSIS — O10913 Unspecified pre-existing hypertension complicating pregnancy, third trimester: Secondary | ICD-10-CM | POA: Insufficient documentation

## 2022-10-11 DIAGNOSIS — O99013 Anemia complicating pregnancy, third trimester: Secondary | ICD-10-CM

## 2022-10-11 DIAGNOSIS — O1203 Gestational edema, third trimester: Secondary | ICD-10-CM | POA: Diagnosis not present

## 2022-10-11 DIAGNOSIS — O99283 Endocrine, nutritional and metabolic diseases complicating pregnancy, third trimester: Secondary | ICD-10-CM | POA: Insufficient documentation

## 2022-10-11 DIAGNOSIS — R519 Headache, unspecified: Secondary | ICD-10-CM | POA: Diagnosis not present

## 2022-10-11 DIAGNOSIS — J45909 Unspecified asthma, uncomplicated: Secondary | ICD-10-CM | POA: Insufficient documentation

## 2022-10-11 DIAGNOSIS — Z7951 Long term (current) use of inhaled steroids: Secondary | ICD-10-CM | POA: Diagnosis not present

## 2022-10-11 DIAGNOSIS — O99513 Diseases of the respiratory system complicating pregnancy, third trimester: Secondary | ICD-10-CM | POA: Insufficient documentation

## 2022-10-11 DIAGNOSIS — Z3A30 30 weeks gestation of pregnancy: Secondary | ICD-10-CM | POA: Diagnosis not present

## 2022-10-11 DIAGNOSIS — Z3689 Encounter for other specified antenatal screening: Secondary | ICD-10-CM

## 2022-10-11 DIAGNOSIS — O26893 Other specified pregnancy related conditions, third trimester: Secondary | ICD-10-CM | POA: Diagnosis present

## 2022-10-11 HISTORY — DX: Gestational (pregnancy-induced) hypertension without significant proteinuria, third trimester: O13.3

## 2022-10-11 LAB — PROTEIN / CREATININE RATIO, URINE
Creatinine, Urine: 50 mg/dL
Protein Creatinine Ratio: 0.12 mg/mg{Cre} (ref 0.00–0.15)
Total Protein, Urine: 6 mg/dL

## 2022-10-11 LAB — URINALYSIS, ROUTINE W REFLEX MICROSCOPIC
Bilirubin Urine: NEGATIVE
Glucose, UA: NEGATIVE mg/dL
Hgb urine dipstick: NEGATIVE
Ketones, ur: NEGATIVE mg/dL
Leukocytes,Ua: NEGATIVE
Nitrite: NEGATIVE
Protein, ur: NEGATIVE mg/dL
Specific Gravity, Urine: 1.006 (ref 1.005–1.030)
pH: 7 (ref 5.0–8.0)

## 2022-10-11 LAB — COMPREHENSIVE METABOLIC PANEL
ALT: 15 U/L (ref 0–44)
AST: 19 U/L (ref 15–41)
Albumin: 2.8 g/dL — ABNORMAL LOW (ref 3.5–5.0)
Alkaline Phosphatase: 69 U/L (ref 38–126)
Anion gap: 10 (ref 5–15)
BUN: 6 mg/dL (ref 6–20)
CO2: 21 mmol/L — ABNORMAL LOW (ref 22–32)
Calcium: 8.9 mg/dL (ref 8.9–10.3)
Chloride: 106 mmol/L (ref 98–111)
Creatinine, Ser: 0.68 mg/dL (ref 0.44–1.00)
GFR, Estimated: >60 mL/min (ref >60)
Glucose, Bld: 108 mg/dL — ABNORMAL HIGH (ref 70–99)
Potassium: 3.7 mmol/L (ref 3.5–5.1)
Sodium: 137 mmol/L (ref 135–145)
Total Bilirubin: 0.7 mg/dL (ref 0.3–1.2)
Total Protein: 6 g/dL — ABNORMAL LOW (ref 6.5–8.1)

## 2022-10-11 LAB — CBC
HCT: 31.6 % — ABNORMAL LOW (ref 36.0–46.0)
Hemoglobin: 10.5 g/dL — ABNORMAL LOW (ref 12.0–15.0)
MCH: 26.3 pg (ref 26.0–34.0)
MCHC: 33.2 g/dL (ref 30.0–36.0)
MCV: 79.2 fL — ABNORMAL LOW (ref 80.0–100.0)
Platelets: 162 10*3/uL (ref 150–400)
RBC: 3.99 MIL/uL (ref 3.87–5.11)
RDW: 14.4 % (ref 11.5–15.5)
WBC: 8.7 10*3/uL (ref 4.0–10.5)
nRBC: 0 % (ref 0.0–0.2)

## 2022-10-11 MED ORDER — LABETALOL HCL 100 MG PO TABS: 100.0000 mg | ORAL_TABLET | Freq: Two times a day (BID) | ORAL | 2 refills | Status: DC

## 2022-10-11 MED ORDER — ACETAMINOPHEN-CAFFEINE 500-65 MG PO TABS
2.0000 | ORAL_TABLET | Freq: Once | ORAL | Status: AC
  Administered 2022-10-11: 2 via ORAL
  Filled 2022-10-11: qty 2

## 2022-10-11 MED ORDER — FERROUS SULFATE 325 (65 FE) MG PO TBEC: 325.0000 mg | DELAYED_RELEASE_TABLET | ORAL | 1 refills | Status: DC

## 2022-10-11 NOTE — MAU Provider Note (Signed)
History     CSN: [id number]  Arrival date and time: 10/11/22 4098   Event Date/Time   First Provider Initiated Contact with Patient 10/11/22 2015      Chief Complaint  Patient presents with   Hypertension   Headache   [name] Collier is a 27 y.o. G1P00 at [redacted]w[redacted]d who receives care at [facility].  She presents today for Possible PreEclampsia.  Patient reports that she has been experiencing elevated blood pressure throughout the day, but does not have CHTN.  She states she has been taking her blood pressure, due to  dizziness and feelings of near syncope.  She reports that today it has been 150s/70s-80s.  She also reprots having "flutters" in her eyes and that she has a HA. She states the HA is "like a migraine" and located in the front of her head.  She states the Gaylyn Rong is improved with rest and a pillow over her head, but worsened with lights and loud noises.  Patient also expresses concern about left hand swelling and numbness.  She states "it feels like I slept on it," but states she usually sleeps on her right side.  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 Patient endorses fetal movement and ctx in her back that radiate to her stomach.  She denies vaginal concerns, but does not that she is having increased vaginal discharge. Patient is here with her mother, Darrel Reach, who expresses her on set of concerns regarding patient and fetal well-being as well as continuity of care.    OB History     Gravida  1   Para  0   Term  0   Preterm  0   AB  0   Living  0      SAB  0   IAB  0   Ectopic  0   Multiple  0   Live Births              Past Medical History:  Diagnosis Date   Anemia    Asthma    Blood in stool    Dysfunctional uterine bleeding 07/01/2013   Headache    PCOS (polycystic ovarian syndrome)    Vaginal Pap smear, abnormal     Past Surgical History:  Procedure Laterality Date   TONSILLECTOMY      TYMPANOSTOMY TUBE PLACEMENT      Family History  Problem Relation Age of Onset   Epilepsy Mother    Healthy Father     Social History   Tobacco Use   Smoking status: Never   Smokeless tobacco: Never  Vaping Use   Vaping Use: Never used  Substance Use Topics   Alcohol use: No    Alcohol/week: 0.0 standard drinks of alcohol   Drug use: No    Allergies:  Allergies  Allergen Reactions   Black Walnut Flavor Anaphylaxis   Cherry Anaphylaxis and Rash    Tongue tingling   Peach [Prunus Persica] Anaphylaxis and Rash    Tongue tingling   Peanut-Containing Drug Products Anaphylaxis   Almond (Diagnostic) Other (See Comments)    Tongue tingling   Aspirin Other (See Comments)    Nosebleeds   [place] Nut (Berthollefia [place])    Cashew Nut (Anacardium Occidentale) Skin Test    Macadamia Nut Oil    Pistachio Nut (Diagnostic)     Medications Prior to Admission  Medication Sig Dispense Refill Last Dose   Prenat-FeFum-FePo-FA-Omega  3 (TARON-C DHA) 35-1 MG CAPS Take 1 capsule by mouth daily.   10/11/2022   albuterol (PROVENTIL) (2.5 MG/3ML) 0.083% nebulizer solution Take 3 mLs (2.5 mg total) by nebulization every 6 (six) hours as needed for wheezing or shortness of breath. 75 mL 12    cholecalciferol (VITAMIN D3) 25 MCG (1000 UNIT) tablet Take 1,000 Units by mouth daily.      NIFEdipine (PROCARDIA XL) 30 MG 24 hr tablet Take 1 tablet (30 mg total) by mouth daily. 60 tablet 2     Review of Systems  Constitutional:  Negative for chills and fever.  Eyes:  Positive for visual disturbance (Yesterday).  Respiratory:  Positive for shortness of breath (Feels like has to "put more effort into breathing." Endorses h/o asthma. Has inhaler, no usage).   Gastrointestinal:  Negative for abdominal pain, constipation, diarrhea, nausea and vomiting.  Genitourinary:  Negative for difficulty urinating, dysuria, vaginal bleeding and vaginal discharge.  Musculoskeletal:  Positive for back pain (Cramping  that she regards as ctx).  Neurological:  Positive for dizziness (Both) and headaches. Negative for light-headedness.   Physical Exam   Blood pressure (!) 142/72, pulse 99, temperature 98.4 F (36.9 C), temperature source Oral, resp. rate 19, height 5\' 6"  (1.676 m), weight 105.9 kg, last menstrual period 03/05/2022, SpO2 99 %. Vitals:   10/11/22 1940 10/11/22 1956 10/11/22 2001 10/11/22 2016  BP: 131/78 (!) 143/75 (!) 142/72 137/65     Physical Exam Constitutional:      Appearance: She is well-developed.  HENT:     Head: Normocephalic and atraumatic.  Eyes:     Conjunctiva/sclera: Conjunctivae normal.  Pulmonary:     Effort: Pulmonary effort is normal. No respiratory distress.     Breath sounds: Normal breath sounds. No wheezing.  Abdominal:     Palpations: Abdomen is soft.     Tenderness: There is no abdominal tenderness.  Musculoskeletal:        General: No tenderness. Normal range of motion.     Cervical back: Normal range of motion.     Right lower leg: 1+ Pitting Edema present.     Left lower leg: 1+ Pitting Edema present.  Skin:    General: Skin is warm and dry.  Neurological:     Mental Status: She is alert and oriented to person, place, and time.     Deep Tendon Reflexes:     Reflex Scores:      Brachioradialis reflexes are 1+ on the right side and 2+ on the left side.    Comments: Negative clonus  Psychiatric:        Mood and Affect: Mood normal.        Behavior: Behavior normal.     Fetal Assessment 155 bpm, Mod Var, -Decels, +Accels Toco: No ctx graphed  MAU Course   Results for orders placed or performed during the hospital encounter of 10/11/22 (from the past 24 hour(s))  Urinalysis, Routine w reflex microscopic -Urine, Clean Catch     Status: Abnormal   Collection Time: 10/11/22  7:44 PM  Result Value Ref Range   Color, Urine STRAW (A) YELLOW   APPearance CLEAR CLEAR   Specific Gravity, Urine 1.006 1.005 - 1.030   pH 7.0 5.0 - 8.0   Glucose, UA  NEGATIVE NEGATIVE mg/dL   Hgb urine dipstick NEGATIVE NEGATIVE   Bilirubin Urine NEGATIVE NEGATIVE   Ketones, ur NEGATIVE NEGATIVE mg/dL   Protein, ur NEGATIVE NEGATIVE mg/dL   Nitrite NEGATIVE NEGATIVE  Leukocytes,Ua NEGATIVE NEGATIVE  Protein / creatinine ratio, urine     Status: None   Collection Time: 10/11/22  7:44 PM  Result Value Ref Range   Creatinine, Urine 50 mg/dL   Total Protein, Urine 6 mg/dL   Protein Creatinine Ratio 0.12 0.00 - 0.15 mg/mg[Cre]  CBC     Status: Abnormal   Collection Time: 10/11/22  9:21 PM  Result Value Ref Range   WBC 8.7 4.0 - 10.5 K/uL   RBC 3.99 3.87 - 5.11 MIL/uL   Hemoglobin 10.5 (L) 12.0 - 15.0 g/dL   HCT 16.1 (L) 09.6 - 04.5 %   MCV 79.2 (L) 80.0 - 100.0 fL   MCH 26.3 26.0 - 34.0 pg   MCHC 33.2 30.0 - 36.0 g/dL   RDW 40.9 81.1 - 91.4 %   Platelets 162 150 - 400 K/uL   nRBC 0.0 0.0 - 0.2 %  Comprehensive metabolic panel     Status: Abnormal   Collection Time: 10/11/22  9:21 PM  Result Value Ref Range   Sodium 137 135 - 145 mmol/L   Potassium 3.7 3.5 - 5.1 mmol/L   Chloride 106 98 - 111 mmol/L   CO2 21 (L) 22 - 32 mmol/L   Glucose, Bld 108 (H) 70 - 99 mg/dL   BUN 6 6 - 20 mg/dL   Creatinine, Ser 7.82 0.44 - 1.00 mg/dL   Calcium 8.9 8.9 - 95.6 mg/dL   Total Protein 6.0 (L) 6.5 - 8.1 g/dL   Albumin 2.8 (L) 3.5 - 5.0 g/dL   AST 19 15 - 41 U/L   ALT 15 0 - 44 U/L   Alkaline Phosphatase 69 38 - 126 U/L   Total Bilirubin 0.7 0.3 - 1.2 mg/dL   GFR, Estimated >21 >30 mL/min   Anion gap 10 5 - 15   No results found.  MDM Physical Exam Labs: CBC, CMP, PC Ratio Measure BPQ15 min EFM Pain Management EKG Prescriptions Consult Assessment and Plan  27 year old G1P0  SIUP at 30.0 weeks Cat I FT Gestational Hypertension Edema of Left Hand Headache    -Exam performed. -Labs ordered -Discussed pain medication for HA. Patient agreeable. -Will give Excedrin migraine as patient reports pain similar to migraine pain. -Discussed  patient c/o dizziness and feeling of syncope.  Informed that this symptom could be related to blood sugars to heart conditions. -Informed that EKG will be ordered to r/o current apparent abnormalities in rhythm. Patient agreeable.  -Informed that edema of left hand likely not related to [facility] or PreE and likely due to carpal tunnel or other issue.   -Consult with Dr. Kathie Rhodes. Autry-Lott regarding EKG read and edema of left hand. *To bedside for exam. *Reports EKG normal. *Hand with edema likely from fluid shifts. However, scoring not suggestive of DVT.  No soft tissue ultrasound necessary as infection process not likely. *Recommend starting on labetalol 100mg  BID for labile blood pressures.  -NST reactive.   Cherre Robins MSN, CNM 10/11/2022, 8:15 PM   Reassessment (10:46 PM) Vitals:   10/11/22 2131 10/11/22 2146 10/11/22 2201 10/11/22 2216  BP: 130/85 125/76 128/77 117/71  Pulse: 98 89 97 87  Resp:      Temp:      TempSrc:      SpO2:      Weight:      Height:       Anemia in pregnancy  -Results as above. -BP normotensive. -Patient reports HA has resolved.  -Discussed starting on  Labetalol dosing per consult with S. Autry-Lott, DO. -Plan to start on 100mg  BID and cautioned that may be decreased or increased as deemed appropriate. Rx sent.  -Patient agreeable. -Informed of EKG and recommendation to monitor edema of hand, but no formal ultrasound evaluation to be performed.  -Informed of HgB level and recommendation to start iron supplement QOD. Rx sent.  -Patient instructed to keep next appt as scheduled. -Endorses having antenatal testing scheduled. -Patient and mother without questions.  -Encouraged to call primary office or return to MAU if symptoms worsen or with the onset of new symptoms. -Discharged to home in stable condition.  Cherre Robins MSN, CNM Advanced Practice Provider, [facility] [employer] [facility]

## 2022-10-11 NOTE — MAU Note (Signed)
..  [name] Collier is a 27 y.o. at [redacted]w[redacted]d here in MAU reporting: preeclampsia symptoms and was told to come into MAU if anything changed from her last visit. Reports left sided swelling and took her blood pressure 154/83. Reports headache that began 2 hours ago, has not taken any medication for it.  Denies visual changes or epigastric pain.   Pain score: 7/10 Vitals:   10/11/22 1940  BP: 131/78  Pulse: 100  Resp: 19  Temp: 98.4 F (36.9 C)  SpO2: 100%     FHT:162 Lab orders placed from triage:  UA

## 2022-10-11 NOTE — MAU Provider Note (Incomplete)
History     CSN: [id number]  Arrival date and time: 10/11/22 [id number]   Event Date/Time   First Provider Initiated Contact with Patient 10/11/22 2015      Chief Complaint  Patient presents with  . Hypertension  . Headache   [name] Collier is a 27 y.o. G1P00 at [redacted]w[redacted]d who receives care at [facility].  She presents today for Possible PreEclampsia.  Patient reports that she has been experiencing elevated blood pressure throughout the day, but does not have CHTN.  She states she has been taking her blood pressure, due to  dizziness and feelings of   OB History     Gravida  1   Para  0   Term  0   Preterm  0   AB  0   Living  0      SAB  0   IAB  0   Ectopic  0   Multiple  0   Live Births              Past Medical History:  Diagnosis Date  . Anemia   . Asthma   . Blood in stool   . Dysfunctional uterine bleeding 07/01/2013  . Headache   . PCOS (polycystic ovarian syndrome)   . Vaginal Pap smear, abnormal     Past Surgical History:  Procedure Laterality Date  . TONSILLECTOMY    . TYMPANOSTOMY TUBE PLACEMENT      Family History  Problem Relation Age of Onset  . Epilepsy Mother   . Healthy Father     Social History   Tobacco Use  . Smoking status: Never  . Smokeless tobacco: Never  Vaping Use  . Vaping Use: Never used  Substance Use Topics  . Alcohol use: No    Alcohol/week: 0.0 standard drinks of alcohol  . Drug use: No    Allergies:  Allergies  Allergen Reactions  . Black Walnut Flavor Anaphylaxis  . Cherry Anaphylaxis and Rash    Tongue tingling  . Peach [Prunus Persica] Anaphylaxis and Rash    Tongue tingling  . Peanut-Containing Drug Products Anaphylaxis  . Almond (Diagnostic) Other (See Comments)    Tongue tingling  . Aspirin Other (See Comments)    Nosebleeds  . [place] Nut (Berthollefia Cayuga)   . Cashew Nut (Anacardium Occidentale) Skin Test   . Macadamia Nut Oil   . Pistachio Nut (Diagnostic)     Medications Prior to  Admission  Medication Sig Dispense Refill Last Dose  . Prenat-FeFum-FePo-FA-Omega 3 (TARON-C DHA) 35-1 MG CAPS Take 1 capsule by mouth daily.   10/11/2022  . albuterol (PROVENTIL) (2.5 MG/3ML) 0.083% nebulizer solution Take 3 mLs (2.5 mg total) by nebulization every 6 (six) hours as needed for wheezing or shortness of breath. 75 mL 12   . cholecalciferol (VITAMIN D3) 25 MCG (1000 UNIT) tablet Take 1,000 Units by mouth daily.     Marland Kitchen NIFEdipine (PROCARDIA XL) 30 MG 24 hr tablet Take 1 tablet (30 mg total) by mouth daily. 60 tablet 2     Review of Systems  Constitutional:  Negative for chills and fever.  Eyes:  Positive for visual disturbance (Yesterday).  Respiratory:  Positive for shortness of breath (Feels like has to "put more effort into breathing." Endorses h/o asthma. Has inhaler, no usage).   Gastrointestinal:  Negative for abdominal pain, constipation, diarrhea, nausea and vomiting.  Genitourinary:  Negative for difficulty urinating, dysuria, vaginal bleeding and vaginal discharge.  Musculoskeletal:  Positive for back  pain (Cramping that she regards as ctx).  Neurological:  Positive for dizziness (Both) and headaches. Negative for light-headedness.   Physical Exam   Blood pressure (!) 142/72, pulse 99, temperature 98.4 F (36.9 C), temperature source Oral, resp. rate 19, height 5\' 6"  (1.676 m), weight 105.9 kg, last menstrual period 03/05/2022, SpO2 99 %. Vitals:   10/11/22 1940 10/11/22 1956 10/11/22 2001 10/11/22 2016  BP: 131/78 (!) 143/75 (!) 142/72 137/65     Physical Exam Constitutional:      Appearance: She is well-developed.  HENT:     Head: Normocephalic and atraumatic.  Eyes:     Conjunctiva/sclera: Conjunctivae normal.  Pulmonary:     Effort: Pulmonary effort is normal. No respiratory distress.     Breath sounds: Normal breath sounds. No wheezing.  Abdominal:     Palpations: Abdomen is soft.     Tenderness: There is no abdominal tenderness.  Musculoskeletal:         General: No tenderness. Normal range of motion.     Cervical back: Normal range of motion.     Right lower leg: 1+ Pitting Edema present.     Left lower leg: 1+ Pitting Edema present.  Skin:    General: Skin is warm and dry.  Neurological:     Mental Status: She is alert and oriented to person, place, and time.     Deep Tendon Reflexes:     Reflex Scores:      Brachioradialis reflexes are 1+ on the right side and 2+ on the left side.    Comments: Negative clonus  Psychiatric:        Mood and Affect: Mood normal.        Behavior: Behavior normal.     Fetal Assessment 155 bpm, Mod Var, -Decels, +Accels Toco: No ctx graphed  MAU Course   Results for orders placed or performed during the hospital encounter of 10/11/22 (from the past 24 hour(s))  Urinalysis, Routine w reflex microscopic -Urine, Clean Catch     Status: Abnormal   Collection Time: 10/11/22  7:44 PM  Result Value Ref Range   Color, Urine STRAW (A) YELLOW   APPearance CLEAR CLEAR   Specific Gravity, Urine 1.006 1.005 - 1.030   pH 7.0 5.0 - 8.0   Glucose, UA NEGATIVE NEGATIVE mg/dL   Hgb urine dipstick NEGATIVE NEGATIVE   Bilirubin Urine NEGATIVE NEGATIVE   Ketones, ur NEGATIVE NEGATIVE mg/dL   Protein, ur NEGATIVE NEGATIVE mg/dL   Nitrite NEGATIVE NEGATIVE   Leukocytes,Ua NEGATIVE NEGATIVE  Protein / creatinine ratio, urine     Status: None   Collection Time: 10/11/22  7:44 PM  Result Value Ref Range   Creatinine, Urine 50 mg/dL   Total Protein, Urine 6 mg/dL   Protein Creatinine Ratio 0.12 0.00 - 0.15 mg/mg[Cre]  CBC     Status: Abnormal   Collection Time: 10/11/22  9:21 PM  Result Value Ref Range   WBC 8.7 4.0 - 10.5 K/uL   RBC 3.99 3.87 - 5.11 MIL/uL   Hemoglobin 10.5 (L) 12.0 - 15.0 g/dL   HCT 31.6 (L) 36.0 - 46.0 %   MCV 79.2 (L) 80.0 - 100.0 fL   MCH 26.3 26.0 - 34.0 pg   MCHC 33.2 30.0 - 36.0 g/dL   RDW 14.4 11.5 - 15.5 %   Platelets 162 150 - 400 K/uL   nRBC 0.0 0.0 - 0.2 %  Comprehensive  metabolic panel     Status: Abnormal   Collection  Time: 10/11/22  9:21 PM  Result Value Ref Range   Sodium 137 135 - 145 mmol/L   Potassium 3.7 3.5 - 5.1 mmol/L   Chloride 106 98 - 111 mmol/L   CO2 21 (L) 22 - 32 mmol/L   Glucose, Bld 108 (H) 70 - 99 mg/dL   BUN 6 6 - 20 mg/dL   Creatinine, Ser 0.68 0.44 - 1.00 mg/dL   Calcium 8.9 8.9 - 10.3 mg/dL   Total Protein 6.0 (L) 6.5 - 8.1 g/dL   Albumin 2.8 (L) 3.5 - 5.0 g/dL   AST 19 15 - 41 U/L   ALT 15 0 - 44 U/L   Alkaline Phosphatase 69 38 - 126 U/L   Total Bilirubin 0.7 0.3 - 1.2 mg/dL   GFR, Estimated >60 >60 mL/min   Anion gap 10 5 - 15   No results found.  MDM Physical Exam Labs: CBC, CMP, PC Ratio Measure BPQ15 min EFM Pain Management EKG Prescriptions Consult Assessment and Plan  27 year old G1P0  SIUP at 30.0 weeks Cat I FT Gestational Hypertension Edema of Left Hand Headache    -Exam performed. -Labs ordered -Discussed pain medication for HA. Patient agreeable. -Will give Excedrin migraine as patient reports pain similar to migraine pain. -Discussed patient c/o dizziness and feeling of syncope.  Informed that this symptom could be related to blood sugars to heart conditions. -Informed that EKG will be ordered to r/o current apparent abnormalities in rhythm. Patient agreeable.  -Informed that edema of left hand likely not related to [facility] or PreE and likely due to carpal tunnel or other issue.   -Consult with Dr. Chauncey Cruel. Autry-Lott regarding EKG read and edema of left hand. *To bedside for exam. *Reports EKG normal. *Hand with edema likely from fluid shifts. However, scoring not suggestive of DVT.  No soft tissue ultrasound necessary as infection process not likely. *Recommend starting on labetalol 100mg  BID for labile blood pressures.  -NST reactive.   Maryann Conners MSN, CNM 10/11/2022, 8:15 PM   Reassessment (10:46 PM) Vitals:   10/11/22 2131 10/11/22 2146 10/11/22 2201 10/11/22 2216  BP: 130/85 125/76  128/77 117/71  Pulse: 98 89 97 87  Resp:      Temp:      TempSrc:      SpO2:      Weight:      Height:       Anemia in pregnancy  -Results as above. -BP normotensive. -Patient reports HA has resolved.  -Discussed starting on Labetalol dosing per consult with S. Autry-Lott, DO. -Plan to start on 100mg  BID and cautioned that may be decreased or increased as deemed appropriate. Rx sent.  -Patient agreeable. -Informed of EKG and recommendation to monitor edema of hand, but no formal ultrasound evaluation to be performed.  -Informed of HgB level and recommendation to start iron supplement QOD. Rx sent.  -Patient instructed to keep next appt as scheduled. -Endorses having antenatal testing scheduled. -Patient and mother without questions.  -Encouraged to call primary office or return to MAU if symptoms worsen or with the onset of new symptoms. -Discharged to home in stable condition.  Maryann Conners MSN, CNM Advanced Practice Provider, [facility]

## 2022-10-14 ENCOUNTER — Encounter: Payer: Self-pay | Admitting: Cardiology

## 2022-10-14 ENCOUNTER — Encounter: Payer: Self-pay | Admitting: Obstetrics & Gynecology

## 2022-10-14 ENCOUNTER — Other Ambulatory Visit: Payer: BC Managed Care – PPO

## 2022-10-14 ENCOUNTER — Ambulatory Visit: Payer: BC Managed Care – PPO | Admitting: Obstetrics & Gynecology

## 2022-10-14 ENCOUNTER — Ambulatory Visit: Payer: BC Managed Care – PPO | Attending: Cardiology | Admitting: Cardiology

## 2022-10-14 VITALS — BP 128/77 | HR 105 | Wt 232.0 lb

## 2022-10-14 VITALS — BP 124/66 | HR 95 | Ht 66.0 in | Wt 232.2 lb

## 2022-10-14 DIAGNOSIS — O133 Gestational [pregnancy-induced] hypertension without significant proteinuria, third trimester: Secondary | ICD-10-CM | POA: Diagnosis not present

## 2022-10-14 DIAGNOSIS — E282 Polycystic ovarian syndrome: Secondary | ICD-10-CM

## 2022-10-14 DIAGNOSIS — O99210 Obesity complicating pregnancy, unspecified trimester: Secondary | ICD-10-CM | POA: Diagnosis not present

## 2022-10-14 DIAGNOSIS — Z3A30 30 weeks gestation of pregnancy: Secondary | ICD-10-CM | POA: Diagnosis not present

## 2022-10-14 DIAGNOSIS — O99013 Anemia complicating pregnancy, third trimester: Secondary | ICD-10-CM

## 2022-10-14 DIAGNOSIS — O0993 Supervision of high risk pregnancy, unspecified, third trimester: Secondary | ICD-10-CM

## 2022-10-14 DIAGNOSIS — O99213 Obesity complicating pregnancy, third trimester: Secondary | ICD-10-CM

## 2022-10-14 NOTE — Patient Instructions (Signed)
Medication Instructions:  Your physician recommends that you continue on your current medications as directed. Please refer to the Current Medication list given to you today.  *If you need a refill on your cardiac medications before your next appointment, please call your pharmacy*   Lab Work: None   Testing/Procedures: None   Follow-Up: At [facility] [place], you and your health needs are our priority.  As part of our continuing mission to provide you with exceptional heart care, we have created designated Provider Care Teams.  These Care Teams include your primary Cardiologist (physician) and Advanced Practice Providers (APPs -  Physician Assistants and Nurse Practitioners) who all work together to provide you with the care you need, when you need it.  Your next appointment:   4 week(s)  Provider:   Berniece Salines, DO

## 2022-10-14 NOTE — Progress Notes (Signed)
ROB   Recent MAU visit for gestational HTN.  Pt states she does not like side effects of Labetalol and does not want to take this Rx.   CC: Possible leaking wants cervix check for assurance.

## 2022-10-14 NOTE — Progress Notes (Signed)
PRENATAL VISIT NOTE  Subjective:  [name] Collier is a 27 y.o. G1P0000 at [redacted]w[redacted]d being seen today for ongoing prenatal care.  She is currently monitored for the following issues for this high-risk pregnancy and has ECZEMA; Vitamin D insufficiency; Constipation; PCOS (polycystic ovarian syndrome); Supervision of high-risk pregnancy; Gonorrhea affecting pregnancy in first trimester; Urinary tract infection in mother during first trimester of pregnancy; Obesity in pregnancy; BMI 36.0-36.9,adult; Threatened preterm labor, antepartum; Gestational hypertension w/o significant proteinuria in 3rd trimester; and Anemia in pregnancy, third trimester on their problem list.  Patient reports no complaints.  Seen in MAU on 10/11/22 for elevated BP, started on Labetalol.  She did not take this medication because she was worried about potential side effects, has been taking ginger and cayenne pepper and herbal supplements after consulting with holistic practitioner. Contractions: Irritability. Vag. Bleeding: None.  Movement: Present. Denies leaking of fluid.   The following portions of the patient's history were reviewed and updated as appropriate: allergies, current medications, past family history, past medical history, past social history, past surgical history and problem list.   Objective:   Vitals:   10/14/22 1440  BP: 128/77  Pulse: (!) 105  Weight: 232 lb (105.2 kg)    Fetal Status: Fetal Heart Rate (bpm): 152   Movement: Present     General:  Alert, oriented and cooperative. Patient is in no acute distress.  Skin: Skin is warm and dry. No rash noted.   Cardiovascular: Normal heart rate noted  Respiratory: Normal respiratory effort, no problems with respiration noted  Abdomen: Soft, gravid, appropriate for gestational age.  Pain/Pressure: Present     Pelvic: Cervical exam performed in the presence of a chaperone Dilation: Closed Effacement (%): Thick Station: Ballotable. Amnioswab negative  (patient reported intermittent wetness)  Extremities: Normal range of motion.  Edema: Trace  Mental Status: Normal mood and affect. Normal behavior. Normal judgment and thought content.   [place] MFM OB FOLLOW UP  Result Date: 09/24/2022 ----------------------------------------------------------------------  OBSTETRICS REPORT                       (Signed Final 09/24/2022 04:31 pm) ---------------------------------------------------------------------- Patient Info  ID #:       [id number]                          D.O.B.:  February 25, 1996 (26 yrs)  Name:       [name] Collier                Visit Date: 09/24/2022 03:28 pm ---------------------------------------------------------------------- Performed By  Attending:        Johnell Comings MD         Referred By:      [facility] MAU/Triage  Performed By:     Rosana Hoes          [facility] ---------------------------------------------------------------------- Orders  #  Description                           Code        Ordered By  1  [place] MFM OB FOLLOW UP                   [id number]    Sander Nephew ----------------------------------------------------------------------  #  Order #                     Accession #                Episode #  1  [id number]                   [id number]                 [id number] ---------------------------------------------------------------------- Indications  Obesity complicating pregnancy, second         O99.212  trimester  Encounter for antenatal screening for          Z36.3  malformations  [redacted] weeks gestation of pregnancy                Z3A.27 ---------------------------------------------------------------------- Fetal Evaluation  Num Of Fetuses:         1  Fetal  Heart Rate(bpm):  168  Cardiac Activity:       Observed  Presentation:           Cephalic  Placenta:               Posterior  P. Cord Insertion:      Previously visualized  Amniotic Fluid  AFI FV:      Within normal limits                              Largest Pocket(cm)                              7.14 ---------------------------------------------------------------------- Biometry  BPD:      70.5  mm     G. Age:  28w 2d         64  %    CI:        73.93   %    70 - 86                                                          FL/HC:      20.6   %  18.8 - 20.6  HC:      260.4  mm     G. Age:  28w 2d         44  %    HC/AC:      1.07        1.05 - 1.21  AC:      243.4  mm     G. Age:  28w 4d         73  %    FL/BPD:     76.0   %    71 - 87  FL:       53.6  mm     G. Age:  28w 3d         61  %    FL/AC:      22.0   %    20 - 24  Est. FW:    1236  gm    2 lb 12 oz      74  % ---------------------------------------------------------------------- OB History  Gravidity:    1         Term:   0        Prem:   0        SAB:   0  TOP:          0       Ectopic:  0        Living: 0 ---------------------------------------------------------------------- Gestational Age  LMP:           29w 0d        Date:  03/05/22                   EDD:   12/10/22  U/S Today:     28w 3d                                        EDD:   12/14/22  Best:          27w 4d     Det. By:  Loman Chroman         EDD:   12/20/22                                      (04/25/22) ---------------------------------------------------------------------- Anatomy  Cranium:               Previously seen        LVOT:                   Previously seen  Cavum:                 Previously             Aortic Arch:            Previously seen                         visualized  Ventricles:            Appears normal         Ductal Arch:            Previously seen  Choroid Plexus:        Previously seen        Diaphragm:  Previously seen  Cerebellum:            Previously  seen        Stomach:                Appears normal, left                                                                        sided  Posterior Fossa:       Previously seen        Abdomen:                Previously seen  Nuchal Fold:           Previously seen        Abdominal Wall:         Previously seen  Face:                  Orbits and profile     Cord Vessels:           Previously seen                         previously seen  Lips:                  Previously seen        Kidneys:                Appear normal  Palate:                Previously             Bladder:                Appears normal                         visualized  Thoracic:              Previously seen        Spine:                  Appears normal  Heart:                 Previously seen        Upper Extremities:      Previously seen  RVOT:                  Previously seen        Lower Extremities:      Previously seen  Other:  All other anatomy previously visualized ---------------------------------------------------------------------- Cervix Uterus Adnexa  Adnexa  No abnormality visualized ---------------------------------------------------------------------- Comments  This patient was seen for a follow up growth scan due to  maternal obesity with a BMI of 31.  She reports feeling frequent contractions that have been  treated with nifedipine.  Her cervix is 1 cm dilated.  She  received a complete course of antenatal corticosteroids due  to preterm labor symptoms last week.  She was informed that the fetal growth and amniotic fluid  level appears appropriate for her gestational age.  The views of the fetal anatomy were limited today due to the  fetal position.  A follow-up exam was scheduled in 5 weeks to assess the  fetal growth. ----------------------------------------------------------------------                   Johnell Comings, MD Electronically Signed Final Report   09/24/2022 04:31 pm  ----------------------------------------------------------------------   Assessment and Plan:  Pregnancy: G1P0000 at [redacted]w[redacted]d 1. Gestational hypertension w/o significant proteinuria in 3rd trimester Normal BP today. Cautioned against taking herbal preparations, unsure of effects on her and on her fetus.  Discussed that she can be on Nifedipine if wanted. She is comfortable with taking her BP at home for now, will enter into Babyscripts. If elevated again, will recommend Nifedipine. Will start weekly antenatal testing at 32 weeks. Next growth scan is on 10/30/2022. No severe features currently.  - Enroll Patient in PreNatal Babyscripts - Babyscripts Schedule Optimization  2. Anemia in pregnancy, third trimester On oral iron therapy.  3. Obesity in pregnancy TWG 37 lbs.  4. [redacted] weeks gestation of pregnancy 5. Supervision of high risk pregnancy in third trimester No other concerns. Preterm labor symptoms and general obstetric precautions including but not limited to vaginal bleeding, contractions, leaking of fluid and fetal movement were reviewed in detail with the patient. Please refer to After Visit Summary for other counseling recommendations.   Return in about 2 weeks (around 10/28/2022) for NST, BPP, OFFICE OB VISIT (MD only).  Future Appointments  Date Time Provider [place]  10/14/2022  3:40 PM Berniece Salines, DO [facility] None  10/28/2022  3:30 PM Timmya Blazier, Sallyanne Havers, MD [facility]  10/30/2022  3:15 PM [facility] NURSE [facility]  10/30/2022  3:30 PM [facility] US3 [facility]  11/12/2022  3:30 PM Lief Palmatier, Sallyanne Havers, MD [facility]  11/26/2022  3:30 PM Hollyann Pablo, Sallyanne Havers, MD [facility]  12/03/2022  3:30 PM Earlean Fidalgo, Sallyanne Havers, MD [facility]  12/10/2022  3:30 PM Donnamae Jude, MD [facility]  12/17/2022  3:30 PM Constant, Vickii Chafe, MD [facility]    Verita Schneiders, MD

## 2022-10-15 ENCOUNTER — Encounter: Payer: Self-pay | Admitting: Family Medicine

## 2022-10-15 NOTE — Progress Notes (Signed)
Cardio-Obstetrics Clinic  New Evaluation  Date:  10/15/2022   ID:  MILEYA GIRONDA, DOB 29-Jun-1996, MRN [id number]  PCP:  Patient, No Pcp Per   [facility] Providers Cardiologist:  Berniece Salines, DO  Electrophysiologist:  None       Referring MD: No ref. provider found   Chief Complaint: "I have some increase heart rate"  History of Present Illness:    [name] Collier is a 27 y.o. female [G1P0000] who is being seen today for the evaluation of increased heart rate at the request of No ref. provider found.   Medical history includes PCOS, gestational hypertension asthma here today for follow-up visit.  Today she tells me that she has been having fluctuating blood pressure.  She has been given some nifedipine which she takes as needed.  No chest pain or shortness of breath.  She has not had any fainting spells since the last time as outpatient.  She is currently 30 weeks 4 days pregnant.  Prior CV Studies Reviewed: The following studies were reviewed today: None  Past Medical History:  Diagnosis Date   Anemia    Asthma    Blood in stool    Dysfunctional uterine bleeding 07/01/2013   Headache    PCOS (polycystic ovarian syndrome)    Vaginal Pap smear, abnormal     Past Surgical History:  Procedure Laterality Date   TONSILLECTOMY     TYMPANOSTOMY TUBE PLACEMENT        OB History     Gravida  1   Para  0   Term  0   Preterm  0   AB  0   Living  0      SAB  0   IAB  0   Ectopic  0   Multiple  0   Live Births                  Current Medications: Current Meds  Medication Sig   albuterol (PROVENTIL) (2.5 MG/3ML) 0.083% nebulizer solution Take 3 mLs (2.5 mg total) by nebulization every 6 (six) hours as needed for wheezing or shortness of breath.   cholecalciferol (VITAMIN D3) 25 MCG (1000 UNIT) tablet Take 1,000 Units by mouth daily.   ferrous sulfate 325 (65 FE) MG EC tablet Take 1 tablet (325 mg total) by mouth every other day.    NIFEdipine (PROCARDIA XL) 30 MG 24 hr tablet Take 1 tablet (30 mg total) by mouth daily.   Prenat-FeFum-FePo-FA-Omega 3 (TARON-C DHA) 35-1 MG CAPS Take 1 capsule by mouth daily.     Allergies:   Black walnut flavor, Cherry, Peach [prunus persica], Peanut-containing drug products, Almond (diagnostic), Aspirin, [place] nut (berthollefia excelsa), Cashew nut (anacardium occidentale) skin test, Macadamia nut oil, and Pistachio nut (diagnostic)   Social History   Socioeconomic History   Marital status: Single    Spouse name: Not on file   Number of children: Not on file   Years of education: Not on file   Highest education level: Not on file  Occupational History   Not on file  Tobacco Use   Smoking status: Never   Smokeless tobacco: Never  Vaping Use   Vaping Use: Never used  Substance and Sexual Activity   Alcohol use: No    Alcohol/week: 0.0 standard drinks of alcohol   Drug use: No   Sexual activity: Not Currently    Partners: Male    Birth control/protection: None  Other Topics Concern  Not on file  Social History Narrative   Not on file   Social Determinants of Health   Financial Resource Strain: Not on file  Food Insecurity: Not on file  Transportation Needs: Not on file  Physical Activity: Not on file  Stress: Not on file  Social Connections: Not on file      Family History  Problem Relation Age of Onset   Epilepsy Mother    Healthy Father       ROS:   Please see the history of present illness.     All other systems reviewed and are negative.   Labs/EKG Reviewed:    EKG:   EKG was not ordered today.  Recent Labs: 10/11/2022: ALT 15; BUN 6; Creatinine, Ser 0.68; Hemoglobin 10.5; Platelets 162; Potassium 3.7; Sodium 137   Recent Lipid Panel No results found for: "CHOL", "TRIG", "HDL", "CHOLHDL", "LDLCALC", "LDLDIRECT"  Physical Exam:    VS:  BP 124/66   Pulse 95   Ht 5\' 6"  (1.676 m)   Wt 105.3 kg   LMP 03/05/2022   SpO2 98%   BMI 37.48  kg/m     Wt Readings from Last 3 Encounters:  10/14/22 105.3 kg  10/14/22 105.2 kg  10/11/22 105.9 kg     GEN:  Well nourished, well developed in no acute distress HEENT: Normal NECK: No JVD; No carotid bruits LYMPHATICS: No lymphadenopathy CARDIAC: RRR, no murmurs, rubs, gallops RESPIRATORY:  Clear to auscultation without rales, wheezing or rhonchi  ABDOMEN: Soft, non-tender, non-distended MUSCULOSKELETAL:  No edema; No deformity  SKIN: Warm and dry NEUROLOGIC:  Alert and oriented x 3 PSYCHIATRIC:  Normal affect    Risk Assessment/Risk Calculators:                  ASSESSMENT & PLAN:    Orthostatic hypotension  Intermittent elevated blood pressure. Obesity in pregnancy  She will continue Aspirin 81 mg daily for preeclampsia prophylaxis.  I am ok with the plan she has with her OBGYN for checking the bp and taking her Nifedpine as needed. HE blood pressure is at target today.  She will get blood pressure cuff and take her blood pressure and send information via my chart in 2 weeks.  The patient is in agreement with the above plan. The patient left the office in stable condition.  The patient will follow up in 4 weeks.   Patient Instructions  Medication Instructions:  Your physician recommends that you continue on your current medications as directed. Please refer to the Current Medication list given to you today.  *If you need a refill on your cardiac medications before your next appointment, please call your pharmacy*   Lab Work: None   Testing/Procedures: None   Follow-Up: At [facility], you and your health needs are our priority.  As part of our continuing mission to provide you with exceptional heart care, we have created designated Provider Care Teams.  These Care Teams include your primary Cardiologist (physician) and Advanced Practice Providers (APPs -  Physician Assistants and Nurse Practitioners) who all work together to provide you with  the care you need, when you need it.  Your next appointment:   4 week(s)  Provider:   Berniece Salines, DO    Dispo:  No follow-ups on file.   Medication Adjustments/Labs and Tests Ordered: Current medicines are reviewed at length with the patient today.  Concerns regarding medicines are outlined above.  Tests Ordered: No orders of the defined types were  placed in this encounter.  Medication Changes: No orders of the defined types were placed in this encounter.

## 2022-10-20 ENCOUNTER — Encounter: Payer: Self-pay | Admitting: Obstetrics & Gynecology

## 2022-10-21 ENCOUNTER — Encounter: Payer: Self-pay | Admitting: Cardiology

## 2022-10-28 ENCOUNTER — Ambulatory Visit (INDEPENDENT_AMBULATORY_CARE_PROVIDER_SITE_OTHER): Payer: Medicaid Other | Admitting: Obstetrics & Gynecology

## 2022-10-28 ENCOUNTER — Other Ambulatory Visit (INDEPENDENT_AMBULATORY_CARE_PROVIDER_SITE_OTHER): Payer: Medicaid Other

## 2022-10-28 ENCOUNTER — Ambulatory Visit (INDEPENDENT_AMBULATORY_CARE_PROVIDER_SITE_OTHER): Payer: BC Managed Care – PPO | Admitting: *Deleted

## 2022-10-28 VITALS — BP 130/79 | HR 105 | Wt 233.0 lb

## 2022-10-28 DIAGNOSIS — O133 Gestational [pregnancy-induced] hypertension without significant proteinuria, third trimester: Secondary | ICD-10-CM

## 2022-10-28 DIAGNOSIS — O0993 Supervision of high risk pregnancy, unspecified, third trimester: Secondary | ICD-10-CM

## 2022-10-28 DIAGNOSIS — Z3A32 32 weeks gestation of pregnancy: Secondary | ICD-10-CM | POA: Diagnosis not present

## 2022-10-28 DIAGNOSIS — O99013 Anemia complicating pregnancy, third trimester: Secondary | ICD-10-CM

## 2022-10-28 NOTE — Progress Notes (Signed)
PRENATAL VISIT NOTE  Subjective:  [name] Collier is a 27 y.o. G1P0000 at [redacted]w[redacted]d being seen today for ongoing prenatal care.  She is currently monitored for the following issues for this high-risk pregnancy and has ECZEMA; Vitamin D insufficiency; Constipation; PCOS (polycystic ovarian syndrome); Supervision of high-risk pregnancy; Gonorrhea affecting pregnancy in first trimester; Urinary tract infection in mother during first trimester of pregnancy; Obesity in pregnancy; BMI 36.0-36.9,adult; Threatened preterm labor, antepartum; Gestational hypertension w/o significant proteinuria in 3rd trimester; and Anemia in pregnancy, third trimester on their problem list.  Patient reports no complaints.  Contractions: Irritability. Vag. Bleeding: None.  Movement: Present. Denies leaking of fluid.   The following portions of the patient's history were reviewed and updated as appropriate: allergies, current medications, past family history, past medical history, past social history, past surgical history and problem list.   Objective:   Vitals:   10/28/22 1621 10/28/22 1625  BP: (!) 141/80 130/79  Pulse: (!) 105   Weight: 233 lb (105.7 kg)     Fetal Status: Fetal Heart Rate (bpm): NST   Movement: Present     General:  Alert, oriented and cooperative. Patient is in no acute distress.  Skin: Skin is warm and dry. No rash noted.   Cardiovascular: Normal heart rate noted  Respiratory: Normal respiratory effort, no problems with respiration noted  Abdomen: Soft, gravid, appropriate for gestational age.  Pain/Pressure: Absent     Pelvic: Cervical exam deferred        Extremities: Normal range of motion.     Mental Status: Normal mood and affect. Normal behavior. Normal judgment and thought content.   Assessment and Plan:  Pregnancy: G1P0000 at [redacted]w[redacted]d 1. Gestational hypertension w/o significant proteinuria in 3rd trimester Stable repeat BP. Patient was prescribed Nifedipine XL 30 mg po daily, she  does not take this. Wants to take as needed, but her mother reports BPs at home as high as 170s/90s but normalizes a few minutes later (of note this is not reflected in her Babyscripts data). Emphasized that daily therapy can help prevent fluctuations in BP, further elevated BPs at home. Patient and her mother said the Nifedipine makes her feel dizzy sometimes, so she does not like it. Did not like Labetalol either. Offered Amlodipine or even Methyldopa trial, they refused. Mother was insisting we "take the baby out" since her BP is getting worse. Explained that any early delivery would be done for severe features, patient does not meet criteria for that yet but she may meet criteria if her BP is uncontrolled.  Talked about possible need for hospitalization for BP control if unable to be managed outpatient. Talked about risks of prematurity. Discussed that uncontrolled BP can have maternal and fetal morbidity and mortality, specifically mentioned strokes, eclampsia, maternal and/or fetal death.  Patient only agreed to take medication as needed, will only take if BP ? 135/85 (this was told to her as the threshold when we consider therapy). She reports the cardiologist told her 140/90s, but I told her we are more aggressive in pregnancy in order to prevent to adverse outcomes. Will continue antenatal testing weekly for now and also check labs weekly.  Preeclampsia/severe BP precautions advised. - CBC - Comprehensive metabolic panel - Protein / creatinine ratio, urine  2. [redacted] weeks gestation of pregnancy 3. Supervision of high risk pregnancy in third trimester Preterm labor symptoms and general obstetric precautions including but not limited to vaginal bleeding, contractions, leaking of fluid and fetal movement were reviewed in  detail with the patient. Please refer to After Visit Summary for other counseling recommendations.   Return in about 1 week (around 11/04/2022) for BPP, NST, OFFICE OB VISIT (MD  only).  Future Appointments  Date Time Provider [place]  10/30/2022  3:15 PM [facility] NURSE [facility]  10/30/2022  3:30 PM [facility] US3 [facility]  11/05/2022  3:10 PM [facility] NST [facility]  11/12/2022  3:10 PM [facility] NST [facility]  11/12/2022  3:30 PM Maison Agrusa, Sallyanne Havers, MD [facility]  11/12/2022  4:00 PM Tobb, Kardie, DO [facility] None  11/19/2022  3:10 PM [facility] NST [facility]  11/26/2022  3:10 PM [facility] NST [facility]  11/26/2022  3:30 PM Corwyn Vora, Sallyanne Havers, MD [facility]  12/03/2022  3:30 PM Kolbee Stallman, Sallyanne Havers, MD [facility]  12/10/2022  3:30 PM Donnamae Jude, MD [facility]  12/17/2022  3:30 PM Constant, Vickii Chafe, MD [facility]    Verita Schneiders, MD

## 2022-10-28 NOTE — Progress Notes (Signed)
Patient informed that the ultrasound is considered a limited obstetric ultrasound and is not intended to be a complete ultrasound exam.  Patient also informed that the ultrasound is not being completed with the intent of assessing for fetal or placental anomalies or any pelvic abnormalities. Explained that the purpose of today's ultrasound is to assess for fetal heart rate.  Patient acknowledges the purpose of the exam and the limitations of the study.     Wandy Bossler, RN     

## 2022-10-29 LAB — COMPREHENSIVE METABOLIC PANEL
ALT: 11 IU/L (ref 0–32)
AST: 16 IU/L (ref 0–40)
Albumin/Globulin Ratio: 1.5 (ref 1.2–2.2)
Albumin: 3.8 g/dL — ABNORMAL LOW (ref 4.0–5.0)
Alkaline Phosphatase: 91 IU/L (ref 44–121)
BUN/Creatinine Ratio: 6 — ABNORMAL LOW (ref 9–23)
BUN: 3 mg/dL — ABNORMAL LOW (ref 6–20)
Bilirubin Total: 0.3 mg/dL (ref 0.0–1.2)
CO2: 18 mmol/L — ABNORMAL LOW (ref 20–29)
Calcium: 9 mg/dL (ref 8.7–10.2)
Chloride: 105 mmol/L (ref 96–106)
Creatinine, Ser: 0.52 mg/dL — ABNORMAL LOW (ref 0.57–1.00)
Globulin, Total: 2.5 g/dL (ref 1.5–4.5)
Glucose: 99 mg/dL (ref 70–99)
Potassium: 3.8 mmol/L (ref 3.5–5.2)
Sodium: 138 mmol/L (ref 134–144)
Total Protein: 6.3 g/dL (ref 6.0–8.5)
eGFR: 133 mL/min/{1.73_m2} (ref >59)

## 2022-10-29 LAB — CBC
Hematocrit: 33.4 % — ABNORMAL LOW (ref 34.0–46.6)
Hemoglobin: 10.9 g/dL — ABNORMAL LOW (ref 11.1–15.9)
MCH: 26.2 pg — ABNORMAL LOW (ref 26.6–33.0)
MCHC: 32.6 g/dL (ref 31.5–35.7)
MCV: 80 fL (ref 79–97)
Platelets: 168 10*3/uL (ref 150–450)
RBC: 4.16 x10E6/uL (ref 3.77–5.28)
RDW: 14.5 % (ref 11.7–15.4)
WBC: 8.2 10*3/uL (ref 3.4–10.8)

## 2022-10-29 LAB — PROTEIN / CREATININE RATIO, URINE
Creatinine, Urine: 39.9 mg/dL
Protein, Ur: 6.9 mg/dL
Protein/Creat Ratio: 173 mg/g creat (ref 0–200)

## 2022-10-30 ENCOUNTER — Ambulatory Visit: Payer: Medicaid Other | Attending: Obstetrics and Gynecology

## 2022-10-30 ENCOUNTER — Ambulatory Visit: Payer: Medicaid Other | Admitting: *Deleted

## 2022-10-30 VITALS — BP 133/66 | HR 108

## 2022-10-30 DIAGNOSIS — O99013 Anemia complicating pregnancy, third trimester: Secondary | ICD-10-CM | POA: Diagnosis present

## 2022-10-30 DIAGNOSIS — O133 Gestational [pregnancy-induced] hypertension without significant proteinuria, third trimester: Secondary | ICD-10-CM | POA: Diagnosis not present

## 2022-10-30 DIAGNOSIS — Z3A32 32 weeks gestation of pregnancy: Secondary | ICD-10-CM

## 2022-10-30 DIAGNOSIS — O0993 Supervision of high risk pregnancy, unspecified, third trimester: Secondary | ICD-10-CM | POA: Insufficient documentation

## 2022-10-30 DIAGNOSIS — O99213 Obesity complicating pregnancy, third trimester: Secondary | ICD-10-CM | POA: Diagnosis not present

## 2022-10-30 DIAGNOSIS — O26873 Cervical shortening, third trimester: Secondary | ICD-10-CM

## 2022-10-30 DIAGNOSIS — E669 Obesity, unspecified: Secondary | ICD-10-CM | POA: Diagnosis not present

## 2022-10-30 DIAGNOSIS — O99212 Obesity complicating pregnancy, second trimester: Secondary | ICD-10-CM | POA: Insufficient documentation

## 2022-11-05 ENCOUNTER — Other Ambulatory Visit: Payer: BC Managed Care – PPO

## 2022-11-06 ENCOUNTER — Ambulatory Visit: Payer: Medicaid Other | Admitting: *Deleted

## 2022-11-06 ENCOUNTER — Other Ambulatory Visit (INDEPENDENT_AMBULATORY_CARE_PROVIDER_SITE_OTHER): Payer: Medicaid Other

## 2022-11-06 ENCOUNTER — Ambulatory Visit (INDEPENDENT_AMBULATORY_CARE_PROVIDER_SITE_OTHER): Payer: Medicaid Other | Admitting: Family Medicine

## 2022-11-06 VITALS — BP 128/74 | HR 105 | Wt 236.0 lb

## 2022-11-06 DIAGNOSIS — Z3A33 33 weeks gestation of pregnancy: Secondary | ICD-10-CM

## 2022-11-06 DIAGNOSIS — O99013 Anemia complicating pregnancy, third trimester: Secondary | ICD-10-CM

## 2022-11-06 DIAGNOSIS — O0993 Supervision of high risk pregnancy, unspecified, third trimester: Secondary | ICD-10-CM

## 2022-11-06 DIAGNOSIS — O133 Gestational [pregnancy-induced] hypertension without significant proteinuria, third trimester: Secondary | ICD-10-CM | POA: Diagnosis not present

## 2022-11-06 NOTE — Progress Notes (Signed)
   PRENATAL VISIT NOTE  Subjective:  [name] Collier is a 27 y.o. G1P0000 at [redacted]w[redacted]d being seen today for ongoing prenatal care.  She is currently monitored for the following issues for this high-risk pregnancy and has ECZEMA; Vitamin D insufficiency; Constipation; PCOS (polycystic ovarian syndrome); Supervision of high-risk pregnancy; Gonorrhea affecting pregnancy in first trimester; Urinary tract infection in mother during first trimester of pregnancy; Obesity in pregnancy; BMI 36.0-36.9,adult; Threatened preterm labor, antepartum; Gestational hypertension w/o significant proteinuria in 3rd trimester; and Anemia in pregnancy, third trimester on their problem list.  Patient reports  nose bleeding, some contractions .   .  .   . Denies leaking of fluid.   The following portions of the patient's history were reviewed and updated as appropriate: allergies, current medications, past family history, past medical history, past social history, past surgical history and problem list.   Objective:  There were no vitals filed for this visit.  Fetal Status:           General:  Alert, oriented and cooperative. Patient is in no acute distress.  Skin: Skin is warm and dry. No rash noted.   Cardiovascular: Normal heart rate noted  Respiratory: Normal respiratory effort, no problems with respiration noted  Abdomen: Soft, gravid, appropriate for gestational age.        Pelvic: Cervical exam performed in the presence of a chaperone Dilation: Fingertip Effacement (%): 70 Station: -2, -3  Extremities: Normal range of motion.     Mental Status: Normal mood and affect. Normal behavior. Normal judgment and thought content.  NST:  Baseline: 155 bpm, Variability: Good {> 6 bpm), Accelerations: Reactive, and Decelerations: Absent  Assessment and Plan:  Pregnancy: G1P0000 at [redacted]w[redacted]d 1. Supervision of high risk pregnancy in third trimester Continue prenatal care.   2. Gestational hypertension w/o significant  proteinuria in 3rd trimester BP ok, testing is reassuring Last u/s with normal growth @ 83% Taking meds--discussed risks of prematurity, what would push [place] to delivery early, plan for 37 weeks Patient seen and assessed by nursing staff.  Agree with documentation and plan. Weekly labs - CBC - Comprehensive metabolic panel - Protein / creatinine ratio, urine  Preterm labor symptoms and general obstetric precautions including but not limited to vaginal bleeding, contractions, leaking of fluid and fetal movement were reviewed in detail with the patient. Please refer to After Visit Summary for other counseling recommendations.   Return in 1 week (on 11/13/2022).  Future Appointments  Date Time Provider Department Center  11/12/2022  4:00 PM Thomasene Ripple, DO [facility] None  11/13/2022  3:10 PM [facility] NST [facility]  11/20/2022  9:45 AM [facility] NST [facility]  11/20/2022  9:55 AM Reva Bores, MD [facility]  11/26/2022  3:10 PM [facility] NST [facility]  11/26/2022  3:30 PM Anyanwu, Jethro Bastos, MD [facility]  12/03/2022  3:30 PM Anyanwu, Jethro Bastos, MD [facility]  12/10/2022  3:30 PM Reva Bores, MD [facility]  12/17/2022  3:30 PM Constant, Gigi Gin, MD [facility]    Reva Bores, MD

## 2022-11-06 NOTE — Progress Notes (Signed)
Patient informed that the ultrasound is considered a limited obstetric ultrasound and is not intended to be a complete ultrasound exam.  Patient also informed that the ultrasound is not being completed with the intent of assessing for fetal or placental anomalies or any pelvic abnormalities. Explained that the purpose of today's ultrasound is to assess for fetal well being.  Patient acknowledges the purpose of the exam and the limitations of the study.      Don Tiu, RN      

## 2022-11-07 ENCOUNTER — Encounter: Payer: Self-pay | Admitting: Family Medicine

## 2022-11-07 LAB — COMPREHENSIVE METABOLIC PANEL
ALT: 13 IU/L (ref 0–32)
AST: 14 IU/L (ref 0–40)
Albumin/Globulin Ratio: 1.4 (ref 1.2–2.2)
Albumin: 3.7 g/dL — ABNORMAL LOW (ref 4.0–5.0)
Alkaline Phosphatase: 101 IU/L (ref 44–121)
BUN/Creatinine Ratio: 9 (ref 9–23)
BUN: 5 mg/dL — ABNORMAL LOW (ref 6–20)
Bilirubin Total: 0.3 mg/dL (ref 0.0–1.2)
CO2: 18 mmol/L — ABNORMAL LOW (ref 20–29)
Calcium: 9 mg/dL (ref 8.7–10.2)
Chloride: 105 mmol/L (ref 96–106)
Creatinine, Ser: 0.54 mg/dL — ABNORMAL LOW (ref 0.57–1.00)
Globulin, Total: 2.6 g/dL (ref 1.5–4.5)
Glucose: 82 mg/dL (ref 70–99)
Potassium: 4.1 mmol/L (ref 3.5–5.2)
Sodium: 137 mmol/L (ref 134–144)
Total Protein: 6.3 g/dL (ref 6.0–8.5)
eGFR: 132 mL/min/{1.73_m2} (ref >59)

## 2022-11-07 LAB — CBC
Hematocrit: 32.8 % — ABNORMAL LOW (ref 34.0–46.6)
Hemoglobin: 11.1 g/dL (ref 11.1–15.9)
MCH: 26.3 pg — ABNORMAL LOW (ref 26.6–33.0)
MCHC: 33.8 g/dL (ref 31.5–35.7)
MCV: 78 fL — ABNORMAL LOW (ref 79–97)
Platelets: 162 10*3/uL (ref 150–450)
RBC: 4.22 x10E6/uL (ref 3.77–5.28)
RDW: 13.8 % (ref 11.7–15.4)
WBC: 7.6 10*3/uL (ref 3.4–10.8)

## 2022-11-07 LAB — PROTEIN / CREATININE RATIO, URINE
Creatinine, Urine: 66 mg/dL
Protein, Ur: 8.2 mg/dL
Protein/Creat Ratio: 124 mg/g creat (ref 0–200)

## 2022-11-12 ENCOUNTER — Encounter: Payer: Medicaid Other | Admitting: Obstetrics & Gynecology

## 2022-11-12 ENCOUNTER — Other Ambulatory Visit: Payer: BC Managed Care – PPO

## 2022-11-12 ENCOUNTER — Encounter: Payer: Self-pay | Admitting: Cardiology

## 2022-11-12 ENCOUNTER — Ambulatory Visit: Payer: Medicaid Other | Attending: Cardiology | Admitting: Cardiology

## 2022-11-12 VITALS — BP 126/68 | HR 104 | Ht 66.0 in | Wt 233.0 lb

## 2022-11-12 DIAGNOSIS — O133 Gestational [pregnancy-induced] hypertension without significant proteinuria, third trimester: Secondary | ICD-10-CM | POA: Diagnosis not present

## 2022-11-12 DIAGNOSIS — Z3A34 34 weeks gestation of pregnancy: Secondary | ICD-10-CM | POA: Diagnosis not present

## 2022-11-12 NOTE — Progress Notes (Signed)
Cardio-Obstetrics Clinic  New Evaluation  Date:  11/16/2022   ID:  [name] Collier, DOB [birth year], MRN [id number]  PCP:  Patient, No Pcp Per   [facility] Providers Cardiologist:  Thomasene Ripple, DO  Electrophysiologist:  None       Referring MD: No ref. provider found   Chief Complaint: "I have some increase heart rate"  History of Present Illness:    [name] Collier is a 27 y.o. female [G1P0000] who is being seen today for the evaluation of increased heart rate at the request of No ref. provider found.   Medical history includes PCOS, gestational hypertension asthma here today for follow-up visit.  Still taking antihypertensive on an as needed basis . No compliants today.  She is currently 34 weeks.  Prior CV Studies Reviewed: The following studies were reviewed today: None  Past Medical History:  Diagnosis Date   Anemia    Asthma    Blood in stool    Dysfunctional uterine bleeding 07/01/2013   Headache    PCOS (polycystic ovarian syndrome)    Vaginal Pap smear, abnormal     Past Surgical History:  Procedure Laterality Date   TONSILLECTOMY     TYMPANOSTOMY TUBE PLACEMENT        OB History     Gravida  1   Para  0   Term  0   Preterm  0   AB  0   Living  0      SAB  0   IAB  0   Ectopic  0   Multiple  0   Live Births                  Current Medications: Current Meds  Medication Sig   albuterol (PROVENTIL) (2.5 MG/3ML) 0.083% nebulizer solution Take 3 mLs (2.5 mg total) by nebulization every 6 (six) hours as needed for wheezing or shortness of breath.   cholecalciferol (VITAMIN D3) 25 MCG (1000 UNIT) tablet Take 1,000 Units by mouth daily.   NIFEdipine (PROCARDIA XL) 30 MG 24 hr tablet Take 1 tablet (30 mg total) by mouth daily. (Patient taking differently: Take 30 mg by mouth as needed. take if bp is over 140)   Prenat-FeFum-FePo-FA-Omega 3 (TARON-C DHA) 35-1 MG CAPS Take 1 capsule by mouth daily.     Allergies:    Black walnut flavor, Cherry, Peach [prunus persica], Peanut-containing drug products, Almond (diagnostic), Aspirin, [place] nut (berthollefia excelsa), Cashew nut (anacardium occidentale) skin test, Macadamia nut oil, and Pistachio nut (diagnostic)   Social History   Socioeconomic History   Marital status: Single    Spouse name: Not on file   Number of children: Not on file   Years of education: Not on file   Highest education level: Not on file  Occupational History   Not on file  Tobacco Use   Smoking status: Never   Smokeless tobacco: Never  Vaping Use   Vaping Use: Never used  Substance and Sexual Activity   Alcohol use: No    Alcohol/week: 0.0 standard drinks of alcohol   Drug use: No   Sexual activity: Not Currently    Partners: Male    Birth control/protection: None  Other Topics Concern   Not on file  Social History Narrative   Not on file   Social Determinants of Health   Financial Resource Strain: Not on file  Food Insecurity: Not on file  Transportation Needs: Not on file  Physical Activity: Not  on file  Stress: Not on file  Social Connections: Not on file      Family History  Problem Relation Age of Onset   Epilepsy Mother    Healthy Father       ROS:   Please see the history of present illness.     All other systems reviewed and are negative.   Labs/EKG Reviewed:    EKG:   EKG was not ordered today.  Recent Labs: 11/06/2022: ALT 13; BUN 5; Creatinine, Ser 0.54; Hemoglobin 11.1; Platelets 162; Potassium 4.1; Sodium 137   Recent Lipid Panel No results found for: "CHOL", "TRIG", "HDL", "CHOLHDL", "LDLCALC", "LDLDIRECT"  Physical Exam:    VS:  BP 126/68   Pulse (!) 104   Ht  (1.676 m)   Wt 233 lb (105.7 kg)   LMP 03/05/2022   SpO2 98%   BMI 37.61 kg/m     Wt Readings from Last 3 Encounters:  11/13/22 235 lb (106.6 kg)  11/12/22 233 lb (105.7 kg)  11/06/22 236 lb (107 kg)     GEN:  Well nourished, well developed in no acute  distress HEENT: Normal NECK: No JVD; No carotid bruits LYMPHATICS: No lymphadenopathy CARDIAC: RRR, no murmurs, rubs, gallops RESPIRATORY:  Clear to auscultation without rales, wheezing or rhonchi  ABDOMEN: Soft, non-tender, non-distended MUSCULOSKELETAL:  No edema; No deformity  SKIN: Warm and dry NEUROLOGIC:  Alert and oriented x 3 PSYCHIATRIC:  Normal affect    Risk Assessment/Risk Calculators:                  ASSESSMENT & PLAN:    Orthostatic hypotension  Intermittent elevated blood pressure. Obesity in pregnancy  She will continue Aspirin 81 mg daily for preeclampsia prophylaxis.  I am ok with the plan she has with her OBGYN for checking the bp and taking her Nifedpine as needed. HE blood pressure is at target today.  She will get blood pressure cuff and take her blood pressure and send information via my chart in 2 weeks.  The patient is in agreement with the above plan. The patient left the office in stable condition.  The patient will follow up in 12 weeks.   Patient Instructions  Medication Instructions:  Your physician recommends that you continue on your current medications as directed. Please refer to the Current Medication list given to you today.  *If you need a refill on your cardiac medications before your next appointment, please call your pharmacy*   Lab Work: None.   Testing/Procedures: None   Follow-Up: At [facility], you and your health needs are our priority.  As part of our continuing mission to provide you with exceptional heart care, we have created designated Provider Care Teams.  These Care Teams include your primary Cardiologist (physician) and Advanced Practice Providers (APPs -  Physician Assistants and Nurse Practitioners) who all work together to provide you with the care you need, when you need it.  Your next appointment:   3 week(s)  Provider:   Thomasene Ripple, DO      Dispo:  No follow-ups on file.   Medication  Adjustments/Labs and Tests Ordered: Current medicines are reviewed at length with the patient today.  Concerns regarding medicines are outlined above.  Tests Ordered: No orders of the defined types were placed in this encounter.  Medication Changes: No orders of the defined types were placed in this encounter.

## 2022-11-12 NOTE — Patient Instructions (Signed)
Medication Instructions:  Your physician recommends that you continue on your current medications as directed. Please refer to the Current Medication list given to you today.  *If you need a refill on your cardiac medications before your next appointment, please call your pharmacy*   Lab Work: None.   Testing/Procedures: None   Follow-Up: At [facility], you and your health needs are our priority.  As part of our continuing mission to provide you with exceptional heart care, we have created designated Provider Care Teams.  These Care Teams include your primary Cardiologist (physician) and Advanced Practice Providers (APPs -  Physician Assistants and Nurse Practitioners) who all work together to provide you with the care you need, when you need it.  Your next appointment:   3 week(s)  Provider:   Thomasene Ripple, DO

## 2022-11-13 ENCOUNTER — Ambulatory Visit (INDEPENDENT_AMBULATORY_CARE_PROVIDER_SITE_OTHER): Payer: Medicaid Other | Admitting: *Deleted

## 2022-11-13 ENCOUNTER — Other Ambulatory Visit (INDEPENDENT_AMBULATORY_CARE_PROVIDER_SITE_OTHER): Payer: Medicaid Other

## 2022-11-13 VITALS — BP 137/80 | HR 90 | Wt 235.0 lb

## 2022-11-13 DIAGNOSIS — O0993 Supervision of high risk pregnancy, unspecified, third trimester: Secondary | ICD-10-CM

## 2022-11-13 DIAGNOSIS — O133 Gestational [pregnancy-induced] hypertension without significant proteinuria, third trimester: Secondary | ICD-10-CM

## 2022-11-13 DIAGNOSIS — Z3A34 34 weeks gestation of pregnancy: Secondary | ICD-10-CM | POA: Diagnosis not present

## 2022-11-13 DIAGNOSIS — Z3A36 36 weeks gestation of pregnancy: Secondary | ICD-10-CM

## 2022-11-13 NOTE — Progress Notes (Signed)
Patient informed that the ultrasound is considered a limited obstetric ultrasound and is not intended to be a complete ultrasound exam.  Patient also informed that the ultrasound is not being completed with the intent of assessing for fetal or placental anomalies or any pelvic abnormalities. Explained that the purpose of today's ultrasound is to assess for fetal heart rate.  Patient acknowledges the purpose of the exam and the limitations of the study.     Pt took procardia one time last week after 4/10 visit.   IOL scheduled for 37 weeks.   Scheryl Marten, RN       Patient was assessed and managed by nursing staff during this encounter. I have reviewed the chart and agree with the documentation and plan. I have also made any necessary editorial changes. IOL orders signed and held.   Jaynie Collins, MD 11/14/2022 8:04 AM

## 2022-11-14 ENCOUNTER — Telehealth (HOSPITAL_COMMUNITY): Payer: Self-pay | Admitting: *Deleted

## 2022-11-14 ENCOUNTER — Encounter (HOSPITAL_COMMUNITY): Payer: Self-pay

## 2022-11-14 LAB — PROTEIN / CREATININE RATIO, URINE
Creatinine, Urine: 52 mg/dL
Protein, Ur: 5.8 mg/dL
Protein/Creat Ratio: 112 mg/g creat (ref 0–200)

## 2022-11-14 NOTE — Addendum Note (Signed)
Addended by: Jaynie Collins A on: 11/14/2022 08:07 AM   Modules accepted: Orders

## 2022-11-14 NOTE — Telephone Encounter (Signed)
Preadmission screen  

## 2022-11-15 ENCOUNTER — Encounter (HOSPITAL_COMMUNITY): Payer: Self-pay | Admitting: *Deleted

## 2022-11-15 ENCOUNTER — Telehealth (HOSPITAL_COMMUNITY): Payer: Self-pay | Admitting: *Deleted

## 2022-11-15 NOTE — Telephone Encounter (Signed)
Preadmission screen  

## 2022-11-19 ENCOUNTER — Other Ambulatory Visit: Payer: BC Managed Care – PPO

## 2022-11-20 ENCOUNTER — Ambulatory Visit: Payer: Medicaid Other | Admitting: *Deleted

## 2022-11-20 ENCOUNTER — Ambulatory Visit: Payer: Medicaid Other | Admitting: Family Medicine

## 2022-11-20 ENCOUNTER — Other Ambulatory Visit (INDEPENDENT_AMBULATORY_CARE_PROVIDER_SITE_OTHER): Payer: Medicaid Other

## 2022-11-20 ENCOUNTER — Other Ambulatory Visit (HOSPITAL_COMMUNITY)
Admission: RE | Admit: 2022-11-20 | Discharge: 2022-11-20 | Disposition: A | Discharge status: Home / Self Care | Payer: Medicaid Other | Source: Ambulatory Visit | Attending: Family Medicine | Admitting: Family Medicine

## 2022-11-20 VITALS — BP 130/80 | HR 97 | Wt 233.0 lb

## 2022-11-20 DIAGNOSIS — Z3A35 35 weeks gestation of pregnancy: Secondary | ICD-10-CM | POA: Diagnosis not present

## 2022-11-20 DIAGNOSIS — O133 Gestational [pregnancy-induced] hypertension without significant proteinuria, third trimester: Secondary | ICD-10-CM

## 2022-11-20 DIAGNOSIS — O0993 Supervision of high risk pregnancy, unspecified, third trimester: Secondary | ICD-10-CM

## 2022-11-20 DIAGNOSIS — Z1339 Encounter for screening examination for other mental health and behavioral disorders: Secondary | ICD-10-CM

## 2022-11-20 NOTE — Progress Notes (Signed)
    PRENATAL VISIT NOTE  Subjective:  [name] Collier is a 27 y.o. G1P0000 at [redacted]w[redacted]d being seen today for ongoing prenatal care.  She is currently monitored for the following issues for this high-risk pregnancy and has ECZEMA; Vitamin D insufficiency; Constipation; PCOS (polycystic ovarian syndrome); Supervision of high-risk pregnancy; Gonorrhea affecting pregnancy in first trimester; Urinary tract infection in mother during first trimester of pregnancy; Obesity in pregnancy; BMI 36.0-36.9,adult; Threatened preterm labor, antepartum; Gestational hypertension w/o significant proteinuria in 3rd trimester; and Anemia in pregnancy, third trimester on their problem list.  Patient reports no complaints.   .  .   . Denies leaking of fluid.   The following portions of the patient's history were reviewed and updated as appropriate: allergies, current medications, past family history, past medical history, past social history, past surgical history and problem list.   Objective:  There were no vitals filed for this visit. BP is 130/80  Fetal Status:   Fundal Height: 35 cm    Presentation: Vertex  General:  Alert, oriented and cooperative. Patient is in no acute distress.  Skin: Skin is warm and dry. No rash noted.   Cardiovascular: Normal heart rate noted  Respiratory: Normal respiratory effort, no problems with respiration noted  Abdomen: Soft, gravid, appropriate for gestational age.        Pelvic: Cervical exam performed in the presence of a chaperone Dilation: Fingertip Effacement (%): 50 Station: -2 boil noted in right labia majora  Extremities: Normal range of motion.     Mental Status: Normal mood and affect. Normal behavior. Normal judgment and thought content.  NST:  Baseline: 155 bpm, Variability: Good {> 6 bpm), Accelerations: Reactive, and Decelerations: Absent  Assessment and Plan:  Pregnancy: G1P0000 at 108w5d 1. Gestational hypertension w/o significant proteinuria in 3rd  trimester Weekly testing, labs--See BabyScripts, BP is only in mild range Last growth is 83% - Comprehensive metabolic panel - Protein / creatinine ratio, urine  2. Supervision of high risk pregnancy in third trimester Cultures today - Cervicovaginal ancillary only - Strep Gp B NAA  Preterm labor symptoms and general obstetric precautions including but not limited to vaginal bleeding, contractions, leaking of fluid and fetal movement were reviewed in detail with the patient. Please refer to After Visit Summary for other counseling recommendations.   Return in 1 week (on 11/27/2022).  Future Appointments  Date Time Provider Department Center  11/27/2022 10:15 AM [facility] NST [facility]  11/27/2022 11:15 AM [place] Bing, MD [facility]  11/29/2022 12:00 AM [facility] SCHED ROOM [facility] None  12/02/2022  9:20 AM Tobb, Lavona Mound, DO [facility] None    Reva Bores, MD

## 2022-11-20 NOTE — Progress Notes (Signed)
Has not had to take any procardia for BP's this past week  Does have a boil on her labia she would like checked

## 2022-11-20 NOTE — Progress Notes (Signed)
Patient informed that the ultrasound is considered a limited obstetric ultrasound and is not intended to be a complete ultrasound exam.  Patient also informed that the ultrasound is not being completed with the intent of assessing for fetal or placental anomalies or any pelvic abnormalities. Explained that the purpose of today's ultrasound is to assess for fetal heart rate.  Patient acknowledges the purpose of the exam and the limitations of the study.     Zlaty Alexa, RN     

## 2022-11-21 ENCOUNTER — Encounter: Payer: Self-pay | Admitting: Family Medicine

## 2022-11-21 LAB — COMPREHENSIVE METABOLIC PANEL
ALT: 13 IU/L (ref 0–32)
ALT: 16 IU/L (ref 0–32)
AST: 16 IU/L (ref 0–40)
AST: 22 IU/L (ref 0–40)
Albumin/Globulin Ratio: 1.5 (ref 1.2–2.2)
Albumin/Globulin Ratio: 1.3 (ref 1.2–2.2)
Albumin: 3.7 g/dL — ABNORMAL LOW (ref 4.0–5.0)
Albumin: 3.5 g/dL — ABNORMAL LOW (ref 4.0–5.0)
Alkaline Phosphatase: 104 IU/L (ref 44–121)
Alkaline Phosphatase: 110 IU/L (ref 44–121)
BUN/Creatinine Ratio: 7 — ABNORMAL LOW (ref 9–23)
BUN/Creatinine Ratio: 9 (ref 9–23)
BUN: 4 mg/dL — ABNORMAL LOW (ref 6–20)
BUN: 4 mg/dL — ABNORMAL LOW (ref 6–20)
Bilirubin Total: 0.2 mg/dL (ref 0.0–1.2)
Bilirubin Total: 0.3 mg/dL (ref 0.0–1.2)
CO2: 20 mmol/L (ref 20–29)
CO2: 18 mmol/L — ABNORMAL LOW (ref 20–29)
Calcium: 8.7 mg/dL (ref 8.7–10.2)
Calcium: 9.2 mg/dL (ref 8.7–10.2)
Chloride: 107 mmol/L — ABNORMAL HIGH (ref 96–106)
Chloride: 104 mmol/L (ref 96–106)
Creatinine, Ser: 0.58 mg/dL (ref 0.57–1.00)
Creatinine, Ser: 0.44 mg/dL — ABNORMAL LOW (ref 0.57–1.00)
Globulin, Total: 2.4 g/dL (ref 1.5–4.5)
Globulin, Total: 2.7 g/dL (ref 1.5–4.5)
Glucose: 113 mg/dL — ABNORMAL HIGH (ref 70–99)
Glucose: 94 mg/dL (ref 70–99)
Potassium: 4.2 mmol/L (ref 3.5–5.2)
Potassium: 4.3 mmol/L (ref 3.5–5.2)
Sodium: 137 mmol/L (ref 134–144)
Sodium: 137 mmol/L (ref 134–144)
Total Protein: 6.1 g/dL (ref 6.0–8.5)
Total Protein: 6.2 g/dL (ref 6.0–8.5)
eGFR: 130 mL/min/{1.73_m2} (ref >59)
eGFR: 138 mL/min/{1.73_m2} (ref >59)

## 2022-11-21 LAB — CERVICOVAGINAL ANCILLARY ONLY
Chlamydia: NEGATIVE
Comment: NEGATIVE
Comment: NORMAL
Neisseria Gonorrhea: NEGATIVE

## 2022-11-21 LAB — CBC
Hematocrit: 34.2 % (ref 34.0–46.6)
Hemoglobin: 10.9 g/dL — ABNORMAL LOW (ref 11.1–15.9)
MCH: 25.5 pg — ABNORMAL LOW (ref 26.6–33.0)
MCHC: 31.9 g/dL (ref 31.5–35.7)
MCV: 80 fL (ref 79–97)
Platelets: 164 10*3/uL (ref 150–450)
RBC: 4.28 x10E6/uL (ref 3.77–5.28)
RDW: 14.2 % (ref 11.7–15.4)
WBC: 7.1 10*3/uL (ref 3.4–10.8)

## 2022-11-21 LAB — PROTEIN / CREATININE RATIO, URINE
Creatinine, Urine: 36.4 mg/dL
Protein, Ur: 6 mg/dL
Protein/Creat Ratio: 165 mg/g creat (ref 0–200)

## 2022-11-22 LAB — STREP GP B NAA: Strep Gp B NAA: NEGATIVE

## 2022-11-26 ENCOUNTER — Inpatient Hospital Stay (HOSPITAL_COMMUNITY)
Admission: AD | Admit: 2022-11-26 | Discharge: 2022-11-26 | Disposition: A | Discharge status: Home / Self Care | Payer: Medicaid Other | Attending: Obstetrics & Gynecology | Admitting: Obstetrics & Gynecology

## 2022-11-26 ENCOUNTER — Ambulatory Visit (INDEPENDENT_AMBULATORY_CARE_PROVIDER_SITE_OTHER): Payer: Medicaid Other | Admitting: *Deleted

## 2022-11-26 ENCOUNTER — Encounter: Payer: Medicaid Other | Admitting: Obstetrics & Gynecology

## 2022-11-26 ENCOUNTER — Other Ambulatory Visit: Payer: BC Managed Care – PPO

## 2022-11-26 ENCOUNTER — Encounter (HOSPITAL_COMMUNITY): Payer: Self-pay | Admitting: Obstetrics & Gynecology

## 2022-11-26 ENCOUNTER — Other Ambulatory Visit: Payer: Self-pay

## 2022-11-26 ENCOUNTER — Other Ambulatory Visit: Payer: Medicaid Other

## 2022-11-26 VITALS — BP 143/84 | HR 102

## 2022-11-26 DIAGNOSIS — O26893 Other specified pregnancy related conditions, third trimester: Secondary | ICD-10-CM | POA: Diagnosis not present

## 2022-11-26 DIAGNOSIS — Z3A36 36 weeks gestation of pregnancy: Secondary | ICD-10-CM | POA: Insufficient documentation

## 2022-11-26 DIAGNOSIS — O0993 Supervision of high risk pregnancy, unspecified, third trimester: Secondary | ICD-10-CM

## 2022-11-26 DIAGNOSIS — O212 Late vomiting of pregnancy: Secondary | ICD-10-CM | POA: Diagnosis not present

## 2022-11-26 DIAGNOSIS — O133 Gestational [pregnancy-induced] hypertension without significant proteinuria, third trimester: Secondary | ICD-10-CM | POA: Insufficient documentation

## 2022-11-26 DIAGNOSIS — O99013 Anemia complicating pregnancy, third trimester: Secondary | ICD-10-CM

## 2022-11-26 DIAGNOSIS — R519 Headache, unspecified: Secondary | ICD-10-CM

## 2022-11-26 LAB — COMPREHENSIVE METABOLIC PANEL
ALT: 21 U/L (ref 0–44)
AST: 29 U/L (ref 15–41)
Albumin: 2.7 g/dL — ABNORMAL LOW (ref 3.5–5.0)
Alkaline Phosphatase: 91 U/L (ref 38–126)
Anion gap: 8 (ref 5–15)
BUN: <5 mg/dL — ABNORMAL LOW (ref 6–20)
CO2: 20 mmol/L — ABNORMAL LOW (ref 22–32)
Calcium: 8.8 mg/dL — ABNORMAL LOW (ref 8.9–10.3)
Chloride: 106 mmol/L (ref 98–111)
Creatinine, Ser: 0.56 mg/dL (ref 0.44–1.00)
GFR, Estimated: >60 mL/min (ref >60)
Glucose, Bld: 96 mg/dL (ref 70–99)
Potassium: 3.7 mmol/L (ref 3.5–5.1)
Sodium: 134 mmol/L — ABNORMAL LOW (ref 135–145)
Total Bilirubin: 0.4 mg/dL (ref 0.3–1.2)
Total Protein: 6.2 g/dL — ABNORMAL LOW (ref 6.5–8.1)

## 2022-11-26 LAB — URINALYSIS, ROUTINE W REFLEX MICROSCOPIC
Bilirubin Urine: NEGATIVE
Glucose, UA: NEGATIVE mg/dL
Hgb urine dipstick: NEGATIVE
Ketones, ur: NEGATIVE mg/dL
Leukocytes,Ua: NEGATIVE
Nitrite: NEGATIVE
Protein, ur: NEGATIVE mg/dL
Specific Gravity, Urine: 1.003 — ABNORMAL LOW (ref 1.005–1.030)
pH: 7 (ref 5.0–8.0)

## 2022-11-26 LAB — CBC WITH DIFFERENTIAL/PLATELET
Abs Immature Granulocytes: 0.02 10*3/uL (ref 0.00–0.07)
Basophils Absolute: 0 10*3/uL (ref 0.0–0.1)
Basophils Relative: 0 %
Eosinophils Absolute: 0.1 10*3/uL (ref 0.0–0.5)
Eosinophils Relative: 1 %
HCT: 34.1 % — ABNORMAL LOW (ref 36.0–46.0)
Hemoglobin: 11.3 g/dL — ABNORMAL LOW (ref 12.0–15.0)
Immature Granulocytes: 0 %
Lymphocytes Relative: 14 %
Lymphs Abs: 0.9 10*3/uL (ref 0.7–4.0)
MCH: 26 pg (ref 26.0–34.0)
MCHC: 33.1 g/dL (ref 30.0–36.0)
MCV: 78.4 fL — ABNORMAL LOW (ref 80.0–100.0)
Monocytes Absolute: 0.8 10*3/uL (ref 0.1–1.0)
Monocytes Relative: 12 %
Neutro Abs: 4.7 10*3/uL (ref 1.7–7.7)
Neutrophils Relative %: 73 %
Platelets: 185 10*3/uL (ref 150–400)
RBC: 4.35 MIL/uL (ref 3.87–5.11)
RDW: 14.3 % (ref 11.5–15.5)
WBC: 6.5 10*3/uL (ref 4.0–10.5)
nRBC: 0 % (ref 0.0–0.2)

## 2022-11-26 LAB — PROTEIN / CREATININE RATIO, URINE
Creatinine, Urine: 24 mg/dL
Total Protein, Urine: <6 mg/dL

## 2022-11-26 MED ORDER — ACETAMINOPHEN 500 MG PO TABS
1000.0000 mg | ORAL_TABLET | Freq: Once | ORAL | Status: AC
  Administered 2022-11-26: 1000 mg via ORAL
  Filled 2022-11-26: qty 2

## 2022-11-26 MED ORDER — CYCLOBENZAPRINE HCL 5 MG PO TABS
10.0000 mg | ORAL_TABLET | Freq: Once | ORAL | Status: AC
  Administered 2022-11-26: 10 mg via ORAL
  Filled 2022-11-26: qty 2

## 2022-11-26 NOTE — MAU Provider Note (Cosign Needed Addendum)
History     CSN: [id number]  Arrival date and time: 11/26/22 1627   Event Date/Time   First Provider Initiated Contact with Patient 11/26/22 1810      Chief Complaint  Patient presents with   BP Evaluation   HPI  Ms.[name] Collier is a 27 y.o. female G1P0000 @ [redacted]w[redacted]d here with elevated BP. She has been diagnosed with GHTN @ 28 weeks and was started on Nifedipine around 28-30 weeks.  She takes this only as needed. She does not take this everyday. She reports waking up with a HA this morning and vomited. She took tylenol which helped her HA. She reports 10/10 HA now and requests tylenol. She also reports elevated.  BP at home and in the office. She was sent here from the office for further workup.   OB History     Gravida  1   Para  0   Term  0   Preterm  0   AB  0   Living  0      SAB  0   IAB  0   Ectopic  0   Multiple  0   Live Births              Past Medical History:  Diagnosis Date   Anemia    Asthma    Blood in stool    Dysfunctional uterine bleeding 07/01/2013   Headache    PCOS (polycystic ovarian syndrome)    Vaginal Pap smear, abnormal     Past Surgical History:  Procedure Laterality Date   TONSILLECTOMY     TYMPANOSTOMY TUBE PLACEMENT      Family History  Problem Relation Age of Onset   Epilepsy Mother    Healthy Father     Social History   Tobacco Use   Smoking status: Never   Smokeless tobacco: Never  Vaping Use   Vaping Use: Never used  Substance Use Topics   Alcohol use: No    Alcohol/week: 0.0 standard drinks of alcohol   Drug use: No    Allergies:  Allergies  Allergen Reactions   Black Walnut Flavor Anaphylaxis   Cherry Anaphylaxis and Rash    Tongue tingling   Peach [Prunus Persica] Anaphylaxis and Rash    Tongue tingling   Peanut-Containing Drug Products Anaphylaxis   Almond (Diagnostic) Other (See Comments)    Tongue tingling   Aspirin Other (See Comments)    Nosebleeds   [place] Nut (Berthollefia  [place])    Cashew Nut (Anacardium Occidentale) Skin Test    Macadamia Nut Oil    Pistachio Nut (Diagnostic)     Medications Prior to Admission  Medication Sig Dispense Refill Last Dose   NIFEdipine (PROCARDIA XL) 30 MG 24 hr tablet Take 1 tablet (30 mg total) by mouth daily. (Patient taking differently: Take 30 mg by mouth as needed. take if bp is over 140) 60 tablet 2 11/26/2022 at 1540   Prenat-FeFum-FePo-FA-Omega 3 (TARON-C DHA) 35-1 MG CAPS Take 1 capsule by mouth daily.   11/26/2022 at 0100   albuterol (PROVENTIL) (2.5 MG/3ML) 0.083% nebulizer solution Take 3 mLs (2.5 mg total) by nebulization every 6 (six) hours as needed for wheezing or shortness of breath. 75 mL 12 More than a month   cholecalciferol (VITAMIN D3) 25 MCG (1000 UNIT) tablet Take 1,000 Units by mouth daily.      Results for orders placed or performed during the hospital encounter of 11/26/22 (from the past 48 hour(s))  Urinalysis, Routine w reflex microscopic -Urine, Clean Catch     Status: Abnormal   Collection Time: 11/26/22  5:27 PM  Result Value Ref Range   Color, Urine STRAW (A) YELLOW   APPearance CLEAR CLEAR   Specific Gravity, Urine 1.003 (L) 1.005 - 1.030   pH 7.0 5.0 - 8.0   Glucose, UA NEGATIVE NEGATIVE mg/dL   Hgb urine dipstick NEGATIVE NEGATIVE   Bilirubin Urine NEGATIVE NEGATIVE   Ketones, ur NEGATIVE NEGATIVE mg/dL   Protein, ur NEGATIVE NEGATIVE mg/dL   Nitrite NEGATIVE NEGATIVE   Leukocytes,Ua NEGATIVE NEGATIVE    Comment: Performed at [facility], [address], [place], [place] [address]  Protein / creatinine ratio, urine     Status: None   Collection Time: 11/26/22  5:31 PM  Result Value Ref Range   Creatinine, Urine 24 mg/dL   Total Protein, Urine <6 mg/dL    Comment: NO NORMAL RANGE ESTABLISHED FOR THIS TEST   Protein Creatinine Ratio        0.00 - 0.15 mg/mg[Cre]    Comment: RESULT BELOW REPORTABLE RANGE, UNABLE TO CALCULATE. Performed at [facility], [address], [place], [place] [address]      Review of Systems  Eyes:  Negative for photophobia and visual disturbance.  Gastrointestinal:  Negative for abdominal pain.  Neurological:  Positive for headaches.   Physical Exam   Blood pressure 123/75, pulse 99, temperature 97.7 F (36.5 C), temperature source Oral, resp. rate 19, height 5\' 6"  (1.676 m), weight 107.9 kg, last menstrual period 03/05/2022, SpO2 99 %.  Patient Vitals for the past 24 hrs:  BP Temp Temp src Pulse Resp SpO2 Height Weight  11/26/22 1915 126/73 -- -- 91 -- 100 % -- --  11/26/22 1903 121/71 -- -- 92 -- -- -- --  11/26/22 1804 136/77 -- -- 94 -- 98 % -- --  11/26/22 1745 129/71 -- -- 96 -- 99 % -- --  11/26/22 1737 123/75 -- -- 99 -- -- -- --  11/26/22 1720 137/75 97.7 F (36.5 C) Oral 99 19 99 % -- --  11/26/22 1714 -- -- -- -- -- -- 5\' 6"  (1.676 m) 107.9 kg    Physical Exam Constitutional:      General: She is not in acute distress.    Appearance: Normal appearance. She is not ill-appearing, toxic-appearing or diaphoretic.  HENT:     Head: Normocephalic.  Abdominal:     Palpations: Abdomen is soft.  Skin:    General: Skin is warm.  Neurological:     Mental Status: She is alert and oriented to person, place, and time.     Deep Tendon Reflexes: Reflexes normal.     Comments: Negative clonus     Fetal Tracing: Baseline: 150 bpm Variability: Moderate  Accelerations: 15x15 Decelerations: quick variables  Toco: None  MAU Course  Procedures None  MDM  CBC, CMP, PCR pending Tylenol 1 gram given for her headache. HA post tylenol remains 10/10 Flexeril given 10 mg PO.   Report given Wynelle Bourgeois CNM who resumes care of the patient.   Collier, [name] Dike I, NP  Headache completely resolved with medication  Vitals:   11/26/22 2030 11/26/22 2035 11/26/22 2044 11/26/22 2045  BP:   126/73   Pulse:   93   Resp:      Temp:      TempSrc:      SpO2: 99% 99%  98%  Weight:  Height:       BPs have  remained within normal limits Labs normal   Assessment and Plan  A:  [place] IUP at [redacted]w[redacted]d     Gestational Hypertension     Headache in pregnancy, resolved  P:   Discharge home      Preeclampsia precautions      Has IOL scheduled in a few days      Encouraged to return if she develops worsening of symptoms, increase in pain, fever, or other concerning symptoms.   Aviva Signs, CNM

## 2022-11-26 NOTE — MAU Note (Signed)
[name] Collier is a 27 y.o. at [redacted]w[redacted]d here in MAU reporting: sent from [facility] office for BP evaluation.  Reports has a current HA, hadn't taken any meds.  Denies visual disturbances and epigastric pain.  Endorses +FM.  Denies VB or LOF. Also reports doesn't feel well.  Reports N/V today, has vomited 3x today. LMP: NA Onset of complaint: today Pain score: 9 Vitals:   11/26/22 1720  BP: 137/75  Pulse: 99  Resp: 19  Temp: 97.7 F (36.5 C)  SpO2: 99%     FHT:157 bpm Lab orders placed from triage:   UA

## 2022-11-26 NOTE — Progress Notes (Signed)
Subjective:  [name] Collier is a 27 y.o. female here for BP check. Pt took her BP today and entered it in baby scripts and it was 155/80, she then called the office.   Pt also took her Procardia around 3:30pm today after the 155/80 BP.   Hypertension ROS: Patient has had a headache all day and didn't take anything for it, just has been resting all day. Also states she just doesn't feel good.   Objective:  BP (!) 143/84   Pulse (!) 102   LMP 03/05/2022   Appearance alert, well appearing, and in no distress and looks like she doesn't feel well. General exam BP noted to be elevated today in office.    Assessment:   Blood Pressure needs further observation.   Plan:  Per Dr A pt needs to go to MAU for further evaluation and blood work . Pt verbalizes and understands.   MAU providers made aware of patient on the way.    Scheryl Marten, RN

## 2022-11-27 ENCOUNTER — Ambulatory Visit (INDEPENDENT_AMBULATORY_CARE_PROVIDER_SITE_OTHER): Payer: Medicaid Other | Admitting: Obstetrics and Gynecology

## 2022-11-27 ENCOUNTER — Other Ambulatory Visit (INDEPENDENT_AMBULATORY_CARE_PROVIDER_SITE_OTHER): Payer: Medicaid Other

## 2022-11-27 ENCOUNTER — Other Ambulatory Visit: Payer: Self-pay | Admitting: Advanced Practice Midwife

## 2022-11-27 ENCOUNTER — Ambulatory Visit (INDEPENDENT_AMBULATORY_CARE_PROVIDER_SITE_OTHER): Payer: Medicaid Other | Admitting: *Deleted

## 2022-11-27 VITALS — BP 131/79 | HR 96 | Wt 237.0 lb

## 2022-11-27 DIAGNOSIS — O0993 Supervision of high risk pregnancy, unspecified, third trimester: Secondary | ICD-10-CM

## 2022-11-27 DIAGNOSIS — O133 Gestational [pregnancy-induced] hypertension without significant proteinuria, third trimester: Secondary | ICD-10-CM

## 2022-11-27 DIAGNOSIS — Z3A36 36 weeks gestation of pregnancy: Secondary | ICD-10-CM | POA: Diagnosis not present

## 2022-11-27 NOTE — Progress Notes (Signed)
   PRENATAL VISIT NOTE  Subjective:  [name] Collier is a 27 y.o. G1P0000 at [redacted]w[redacted]d being seen today for ongoing prenatal care.  She is currently monitored for the following issues for this high-risk pregnancy and has PCOS (polycystic ovarian syndrome); Supervision of high-risk pregnancy; Gonorrhea affecting pregnancy in first trimester; Urinary tract infection in mother during first trimester of pregnancy; Obesity in pregnancy; BMI 36.0-36.9,adult; Threatened preterm labor, antepartum; Gestational hypertension w/o significant proteinuria in 3rd trimester; and Anemia in pregnancy, third trimester on their problem list.  Patient reports no complaints.   .  .   . Denies leaking of fluid.  No OB or pre-eclampsia s/s.   The following portions of the patient's history were reviewed and updated as appropriate: allergies, current medications, past family history, past medical history, past social history, past surgical history and problem list.   Objective:  There were no vitals filed for this visit. 131/79 96 237 lbs  Fetal Status:        Presentation: Vertex  General:  Alert, oriented and cooperative. Patient is in no acute distress.  Skin: Skin is warm and dry. No rash noted.   Cardiovascular: Normal heart rate noted  Respiratory: Normal respiratory effort, no problems with respiration noted  Abdomen: Soft, gravid, appropriate for gestational age.        Pelvic: Cervical exam performed in the presence of a chaperone Dilation: Fingertip Effacement (%): 50 Station: Ballotable  Extremities: Normal range of motion.     Mental Status: Normal mood and affect. Normal behavior. Normal judgment and thought content.   Assessment and Plan:  Pregnancy: G1P0000 at [redacted]w[redacted]d 1. [redacted] weeks gestation of pregnancy GBS neg  2. Gestational hypertension w/o significant proteinuria in 3rd trimester Pt set up for 5/3 at 2345. IOL process d/w patient and mother.   Preterm labor symptoms and general obstetric  precautions including but not limited to vaginal bleeding, contractions, leaking of fluid and fetal movement were reviewed in detail with the patient. Please refer to After Visit Summary for other counseling recommendations.   Return in about 12 days (around 12/09/2022) for in person, rn visit.  Future Appointments  Date Time Provider Department Center  11/29/2022 12:00 AM [facility] SCHED ROOM [facility] None  12/02/2022  9:20 AM Tobb, Lavona [place], DO [facility] None    [place] Bing, MD

## 2022-11-27 NOTE — Progress Notes (Signed)
Patient informed that the ultrasound is considered a limited obstetric ultrasound and is not intended to be a complete ultrasound exam.  Patient also informed that the ultrasound is not being completed with the intent of assessing for fetal or placental anomalies or any pelvic abnormalities. Explained that the purpose of today's ultrasound is to assess for fetal well being.  Patient acknowledges the purpose of the exam and the limitations of the study.      Rayette Mogg, RN      

## 2022-11-29 ENCOUNTER — Encounter (HOSPITAL_COMMUNITY): Payer: Self-pay | Admitting: Obstetrics & Gynecology

## 2022-11-29 ENCOUNTER — Inpatient Hospital Stay (HOSPITAL_COMMUNITY)
Admission: RE | Admit: 2022-11-29 | Discharge: 2022-12-03 | DRG: 786 | Disposition: A | Discharge status: Home / Self Care | Payer: Medicaid Other | Attending: Obstetrics and Gynecology | Admitting: Obstetrics and Gynecology

## 2022-11-29 ENCOUNTER — Inpatient Hospital Stay (HOSPITAL_COMMUNITY): Payer: Medicaid Other

## 2022-11-29 DIAGNOSIS — O99214 Obesity complicating childbirth: Secondary | ICD-10-CM | POA: Diagnosis not present

## 2022-11-29 DIAGNOSIS — O99892 Other specified diseases and conditions complicating childbirth: Secondary | ICD-10-CM | POA: Diagnosis present

## 2022-11-29 DIAGNOSIS — O4593 Premature separation of placenta, unspecified, third trimester: Secondary | ICD-10-CM | POA: Diagnosis present

## 2022-11-29 DIAGNOSIS — Z98891 History of uterine scar from previous surgery: Principal | ICD-10-CM

## 2022-11-29 DIAGNOSIS — O4590 Premature separation of placenta, unspecified, unspecified trimester: Secondary | ICD-10-CM | POA: Diagnosis not present

## 2022-11-29 DIAGNOSIS — O164 Unspecified maternal hypertension, complicating childbirth: Secondary | ICD-10-CM | POA: Diagnosis not present

## 2022-11-29 DIAGNOSIS — O133 Gestational [pregnancy-induced] hypertension without significant proteinuria, third trimester: Secondary | ICD-10-CM | POA: Diagnosis present

## 2022-11-29 DIAGNOSIS — O411230 Chorioamnionitis, third trimester, not applicable or unspecified: Secondary | ICD-10-CM | POA: Diagnosis not present

## 2022-11-29 DIAGNOSIS — Z3A36 36 weeks gestation of pregnancy: Secondary | ICD-10-CM | POA: Diagnosis not present

## 2022-11-29 DIAGNOSIS — O9902 Anemia complicating childbirth: Secondary | ICD-10-CM | POA: Diagnosis present

## 2022-11-29 DIAGNOSIS — E669 Obesity, unspecified: Secondary | ICD-10-CM | POA: Diagnosis not present

## 2022-11-29 DIAGNOSIS — Z3A37 37 weeks gestation of pregnancy: Secondary | ICD-10-CM

## 2022-11-29 DIAGNOSIS — O134 Gestational [pregnancy-induced] hypertension without significant proteinuria, complicating childbirth: Principal | ICD-10-CM | POA: Diagnosis present

## 2022-11-29 DIAGNOSIS — R000 Tachycardia, unspecified: Secondary | ICD-10-CM | POA: Diagnosis not present

## 2022-11-29 DIAGNOSIS — O0990 Supervision of high risk pregnancy, unspecified, unspecified trimester: Secondary | ICD-10-CM

## 2022-11-29 LAB — CBC
HCT: 36.2 % (ref 36.0–46.0)
Hemoglobin: 11.7 g/dL — ABNORMAL LOW (ref 12.0–15.0)
MCH: 26.1 pg (ref 26.0–34.0)
MCHC: 32.3 g/dL (ref 30.0–36.0)
MCV: 80.8 fL (ref 80.0–100.0)
Platelets: 180 10*3/uL (ref 150–400)
RBC: 4.48 MIL/uL (ref 3.87–5.11)
RDW: 14.4 % (ref 11.5–15.5)
WBC: 8.3 10*3/uL (ref 4.0–10.5)
nRBC: 0 % (ref 0.0–0.2)

## 2022-11-29 LAB — COMPREHENSIVE METABOLIC PANEL
ALT: 23 U/L (ref 0–44)
AST: 24 U/L (ref 15–41)
Albumin: 3 g/dL — ABNORMAL LOW (ref 3.5–5.0)
Alkaline Phosphatase: 96 U/L (ref 38–126)
Anion gap: 8 (ref 5–15)
BUN: <5 mg/dL — ABNORMAL LOW (ref 6–20)
CO2: 18 mmol/L — ABNORMAL LOW (ref 22–32)
Calcium: 9.3 mg/dL (ref 8.9–10.3)
Chloride: 108 mmol/L (ref 98–111)
Creatinine, Ser: 0.47 mg/dL (ref 0.44–1.00)
GFR, Estimated: >60 mL/min (ref >60)
Glucose, Bld: 99 mg/dL (ref 70–99)
Potassium: 3.6 mmol/L (ref 3.5–5.1)
Sodium: 134 mmol/L — ABNORMAL LOW (ref 135–145)
Total Bilirubin: 0.4 mg/dL (ref 0.3–1.2)
Total Protein: 6.6 g/dL (ref 6.5–8.1)

## 2022-11-29 LAB — PROTEIN / CREATININE RATIO, URINE
Creatinine, Urine: 31 mg/dL
Protein Creatinine Ratio: 0.29 mg/mg{Cre} — ABNORMAL HIGH (ref 0.00–0.15)
Total Protein, Urine: 9 mg/dL

## 2022-11-29 LAB — TYPE AND SCREEN
ABO/RH(D): O POS
Antibody Screen: NEGATIVE

## 2022-11-29 LAB — RPR: RPR Ser Ql: NONREACTIVE

## 2022-11-29 MED ORDER — MISOPROSTOL 50MCG HALF TABLET
50.0000 ug | ORAL_TABLET | Freq: Once | ORAL | Status: AC
  Administered 2022-11-29: 50 ug via ORAL
  Filled 2022-11-29: qty 1

## 2022-11-29 MED ORDER — MISOPROSTOL 25 MCG QUARTER TABLET
25.0000 ug | ORAL_TABLET | Freq: Once | ORAL | Status: AC
  Administered 2022-11-29: 25 ug via VAGINAL
  Filled 2022-11-29: qty 1

## 2022-11-29 MED ORDER — OXYTOCIN-SODIUM CHLORIDE 30-0.9 UT/500ML-% IV SOLN
1.0000 m[IU]/min | INTRAVENOUS | Status: DC
  Administered 2022-11-29: 2 m[IU]/min via INTRAVENOUS

## 2022-11-29 MED ORDER — EPHEDRINE 5 MG/ML INJ: 10.0000 mg | INTRAVENOUS | Status: DC | PRN

## 2022-11-29 MED ORDER — OXYTOCIN-SODIUM CHLORIDE 30-0.9 UT/500ML-% IV SOLN
1.0000 m[IU]/min | INTRAVENOUS | Status: DC
  Administered 2022-11-29: 1 m[IU]/min via INTRAVENOUS
  Filled 2022-11-29: qty 500

## 2022-11-29 MED ORDER — LIDOCAINE HCL (PF) 1 % IJ SOLN: 30.0000 mL | INTRAMUSCULAR | Status: DC | PRN

## 2022-11-29 MED ORDER — GENTAMICIN SULFATE 40 MG/ML IJ SOLN
5.0000 mg/kg | Freq: Once | INTRAVENOUS | Status: AC
  Administered 2022-11-29: 390 mg via INTRAVENOUS
  Filled 2022-11-29: qty 9.75

## 2022-11-29 MED ORDER — OXYTOCIN-SODIUM CHLORIDE 30-0.9 UT/500ML-% IV SOLN: 1.0000 m[IU]/min | INTRAVENOUS | Status: DC

## 2022-11-29 MED ORDER — LACTATED RINGERS IV SOLN
500.0000 mL | INTRAVENOUS | Status: DC | PRN
  Administered 2022-11-29: 1000 mL via INTRAVENOUS
  Administered 2022-11-29 – 2022-11-30 (×3): 500 mL via INTRAVENOUS

## 2022-11-29 MED ORDER — DIPHENHYDRAMINE HCL 50 MG/ML IJ SOLN: 12.5000 mg | INTRAMUSCULAR | Status: DC | PRN

## 2022-11-29 MED ORDER — FLEET ENEMA 7-19 GM/118ML RE ENEM: 1.0000 | ENEMA | Freq: Every day | RECTAL | Status: DC | PRN

## 2022-11-29 MED ORDER — PHENYLEPHRINE 80 MCG/ML (10ML) SYRINGE FOR IV PUSH (FOR BLOOD PRESSURE SUPPORT)
80.0000 ug | PREFILLED_SYRINGE | INTRAVENOUS | Status: DC | PRN
  Filled 2022-11-29: qty 10

## 2022-11-29 MED ORDER — HYDROXYZINE HCL 50 MG PO TABS: 50.0000 mg | ORAL_TABLET | Freq: Four times a day (QID) | ORAL | Status: DC | PRN

## 2022-11-29 MED ORDER — SOD CITRATE-CITRIC ACID 500-334 MG/5ML PO SOLN
30.0000 mL | ORAL | Status: DC | PRN
  Administered 2022-11-30 (×2): 30 mL via ORAL
  Filled 2022-11-29 (×2): qty 30

## 2022-11-29 MED ORDER — OXYTOCIN BOLUS FROM INFUSION: 333.0000 mL | Freq: Once | INTRAVENOUS | Status: DC

## 2022-11-29 MED ORDER — TERBUTALINE SULFATE 1 MG/ML IJ SOLN: 0.2500 mg | Freq: Once | INTRAMUSCULAR | Status: DC | PRN

## 2022-11-29 MED ORDER — FENTANYL CITRATE (PF) 100 MCG/2ML IJ SOLN
50.0000 ug | INTRAMUSCULAR | Status: DC | PRN
  Administered 2022-11-29: 100 ug via INTRAVENOUS
  Administered 2022-11-29: 50 ug via INTRAVENOUS
  Administered 2022-11-29 – 2022-11-30 (×2): 100 ug via INTRAVENOUS
  Filled 2022-11-29 (×4): qty 2

## 2022-11-29 MED ORDER — NIFEDIPINE ER OSMOTIC RELEASE 30 MG PO TB24
30.0000 mg | ORAL_TABLET | Freq: Every day | ORAL | Status: DC
  Filled 2022-11-29: qty 1

## 2022-11-29 MED ORDER — ACETAMINOPHEN 500 MG PO TABS
1000.0000 mg | ORAL_TABLET | Freq: Once | ORAL | Status: AC
  Administered 2022-11-29: 1000 mg via ORAL
  Filled 2022-11-29: qty 2

## 2022-11-29 MED ORDER — FENTANYL-BUPIVACAINE-NACL 0.5-0.125-0.9 MG/250ML-% EP SOLN
12.0000 mL/h | EPIDURAL | Status: DC | PRN
  Filled 2022-11-29: qty 250

## 2022-11-29 MED ORDER — ZOLPIDEM TARTRATE 5 MG PO TABS
5.0000 mg | ORAL_TABLET | Freq: Every evening | ORAL | Status: DC | PRN
Start: 2022-11-29 — End: 2022-11-29

## 2022-11-29 MED ORDER — ZOLPIDEM TARTRATE 5 MG PO TABS
5.0000 mg | ORAL_TABLET | Freq: Every evening | ORAL | Status: DC | PRN
  Administered 2022-11-29: 5 mg via ORAL
  Filled 2022-11-29: qty 1

## 2022-11-29 MED ORDER — ACETAMINOPHEN 325 MG PO TABS
650.0000 mg | ORAL_TABLET | ORAL | Status: DC | PRN
  Administered 2022-11-30: 650 mg via ORAL
  Filled 2022-11-29 (×2): qty 2

## 2022-11-29 MED ORDER — ONDANSETRON HCL 4 MG/2ML IJ SOLN
4.0000 mg | Freq: Four times a day (QID) | INTRAMUSCULAR | Status: DC | PRN
  Administered 2022-11-29: 4 mg via INTRAVENOUS
  Filled 2022-11-29: qty 2

## 2022-11-29 MED ORDER — SODIUM CHLORIDE 0.9 % IV SOLN
2.0000 g | Freq: Four times a day (QID) | INTRAVENOUS | Status: DC
  Administered 2022-11-29 – 2022-11-30 (×3): 2 g via INTRAVENOUS
  Filled 2022-11-29 (×3): qty 2000

## 2022-11-29 MED ORDER — PHENYLEPHRINE 80 MCG/ML (10ML) SYRINGE FOR IV PUSH (FOR BLOOD PRESSURE SUPPORT)
80.0000 ug | PREFILLED_SYRINGE | INTRAVENOUS | Status: DC | PRN
  Administered 2022-11-30 (×2): 80 ug via INTRAVENOUS

## 2022-11-29 MED ORDER — OXYCODONE-ACETAMINOPHEN 5-325 MG PO TABS: 1.0000 | ORAL_TABLET | ORAL | Status: DC | PRN

## 2022-11-29 MED ORDER — LACTATED RINGERS IV SOLN: 500.0000 mL | Freq: Once | INTRAVENOUS | Status: DC

## 2022-11-29 MED ORDER — OXYCODONE-ACETAMINOPHEN 5-325 MG PO TABS: 2.0000 | ORAL_TABLET | ORAL | Status: DC | PRN

## 2022-11-29 MED ORDER — OXYTOCIN-SODIUM CHLORIDE 30-0.9 UT/500ML-% IV SOLN
2.5000 [IU]/h | INTRAVENOUS | Status: DC
  Filled 2022-11-29: qty 500

## 2022-11-29 MED ORDER — LACTATED RINGERS IV SOLN: INTRAVENOUS | Status: DC

## 2022-11-29 NOTE — Progress Notes (Signed)
Patient ID: [name] Collier, female   DOB: 11/16/1995, 27 y.o.   MRN: [id number]  Fetal tachycardia now x 2.5 hrs> pattern/variability are good but rate is high; no maternal fever and membranes are intact; s/p two fluid boluses this past hour  BPs 128/75, 129/65, T 97.8 FHR 170s, +accels, occ mi variables, no decels Ctx irreg 2-4 mins with Pit @ 19mu/min Cx 4+/60/vtx -2; AROM for clear/pink-tinged fluid  IUP@37 .0wks gHTN IOL process Fetal tachycardia/suspected Triple I  Discussion with patient/family/doula re unknown origin of persistent fetal tachy; reviewed this is expected with Triple I- potential for brewing infection? She wants to avoid a C/S if possible, so we discussed earlier AROM in order to facilitate labor process; we discussed that if the tachycardia persists that the variability will eventually decrease and a C/S would be indicated Give Tylenol 1gm and start Amp & Natasha Bence Continue to follow closely  [name] Collier CNM 11/29/2022 11:00 PM

## 2022-11-29 NOTE — Progress Notes (Signed)
Labor Progress Note [name] Collier is a 27 y.o. G1P0000 at [redacted]w[redacted]d presented for IOL for gHTN  S:  Comfortable, no complaints.  O:  BP 131/74   Pulse 80   Temp 98.5 F (36.9 C) (Oral)   Resp 18   LMP 03/05/2022  EFM: baseline 155 bpm/ min-mod variability/ no accels/ occ variable decels  Toco/IUPC: irreg SVE: Dilation: Fingertip Effacement (%): 50 Station: Ballotable Presentation: Vertex Exam by:: Deatra Robinson, RNC   [name]: 27 y.o. G1P0000 [redacted]w[redacted]d  1. Labor: latent 2. FWB: Cat II 3. Pain: analgesia/anesthesia/NO prn 4. gHTN- stable  Consented for FB placement, placed w/o difficulty and pt tolerated well. FHR with periods of minimal variability and variable decels, will use low dose Pitocin for more control of uterine activity. Anticipate SVD.  Donette Larry, CNM 11:29 AM

## 2022-11-29 NOTE — Progress Notes (Signed)
Patient ID: Jerrel Ivory, female   DOB: 06/16/1996, 27 y.o.   MRN: [id number]  Sitting up on ball; s/p cyto and FB; Pit started at 1700; feeling ctx and breathing w them  BPs 129/65, 132/72 T 97.6 FHR 165-170, +accels, no decels, occ mild variables Ctx q 3-5 mins with Pit @ 27mu/min Cx deferred (was 4+/thick at 1924)  IUP@37 .0wks gHTN IOL process Fetal tachycardia without evidence of infection  Restarted home ProcardiaXL 30mg  Fluid bolus for fetal tachycardia Uptitrate Pit to achieve stronger ctx Consider AROM w cervical change Anticipate vag delivery  Arabella Merles 11/29/2022

## 2022-11-29 NOTE — Progress Notes (Signed)
FHR 150s w/some prolonged variable decels, responded to IVF bolus and position change.  Minimal uterine activity.  BPs normal range, has not taken procardia.  Cx still closed/long/ballottable. Will reevaluate around 1000 for next steps in IOL.

## 2022-11-29 NOTE — Progress Notes (Signed)
Labor Progress Note [name] Collier is a 27 y.o. G1P0000 at [redacted]w[redacted]d presented for IOL for gHTN  S:  Feeling uncomfortable with FB, feeling ctx too. Had several doses of Fentanyl.  O:  BP (!) 107/58   Pulse 82   Temp 97.6 F (36.4 C) (Oral)   Resp 18   LMP 03/05/2022  EFM: baseline 135 bpm/ mod variability/ + accels/ no decels  Toco/IUPC: irreg SVE: Dilation: Fingertip Effacement (%): 50 Station: Ballotable Presentation: Vertex Exam by:: bhmabri cnm  [name]: 27 y.o. G1P0000 [redacted]w[redacted]d  1. Labor: latent 2. FWB: Cat I 3. Pain: analgesia/anesthesia/NO prn 4. gHTN: stable  Offered early epidural, declines. Offered NO, which she accepts. Anticipate SVD.  Donette Larry, CNM 4:00 PM

## 2022-11-29 NOTE — H&P (Signed)
[name] Collier is a 27 y.o. female G1P0000 with IUP at [redacted]w[redacted]d presenting for IOL for GHTN. [facility] at [facility]  Prenatal History/Complications:   GHTN dx at 32 weeks, on Procardia 30 XL EFW 83% at 32 weeks  Past Medical History: Past Medical History:  Diagnosis Date   Anemia    Asthma    Blood in stool    Dysfunctional uterine bleeding 07/01/2013   Headache    PCOS (polycystic ovarian syndrome)    Vaginal Pap smear, abnormal     Past Surgical History: Past Surgical History:  Procedure Laterality Date   TONSILLECTOMY     TYMPANOSTOMY TUBE PLACEMENT      Obstetrical History: OB History     Gravida  1   Para  0   Term  0   Preterm  0   AB  0   Living  0      SAB  0   IAB  0   Ectopic  0   Multiple  0   Live Births              Social History: Social History   Socioeconomic History   Marital status: Single    Spouse name: Not on file   Number of children: Not on file   Years of education: Not on file   Highest education level: Not on file  Occupational History   Not on file  Tobacco Use   Smoking status: Never   Smokeless tobacco: Never  Vaping Use   Vaping Use: Never used  Substance and Sexual Activity   Alcohol use: No    Alcohol/week: 0.0 standard drinks of alcohol   Drug use: No   Sexual activity: Not Currently    Partners: Male    Birth control/protection: None  Other Topics Concern   Not on file  Social History Narrative   Not on file   Social Determinants of Health   Financial Resource Strain: Not on file  Food Insecurity: Not on file  Transportation Needs: Not on file  Physical Activity: Not on file  Stress: Not on file  Social Connections: Not on file    Family History: Family History  Problem Relation Age of Onset   Epilepsy Mother    Healthy Father     Allergies: Allergies  Allergen Reactions   Black Walnut Flavor Anaphylaxis   Cherry Anaphylaxis and Rash    Tongue tingling   Peach [Prunus Persica]  Anaphylaxis and Rash    Tongue tingling   Peanut-Containing Drug Products Anaphylaxis   Almond (Diagnostic) Other (See Comments)    Tongue tingling   Aspirin Other (See Comments)    Nosebleeds   [place] Nut (Berthollefia [place])    Cashew Nut (Anacardium Occidentale) Skin Test    Macadamia Nut Oil    Pistachio Nut (Diagnostic)     Medications Prior to Admission  Medication Sig Dispense Refill Last Dose   albuterol (PROVENTIL) (2.5 MG/3ML) 0.083% nebulizer solution Take 3 mLs (2.5 mg total) by nebulization every 6 (six) hours as needed for wheezing or shortness of breath. 75 mL 12    cholecalciferol (VITAMIN D3) 25 MCG (1000 UNIT) tablet Take 1,000 Units by mouth daily.      NIFEdipine (PROCARDIA XL) 30 MG 24 hr tablet Take 1 tablet (30 mg total) by mouth daily. (Patient taking differently: Take 30 mg by mouth as needed. take if bp is over 140) 60 tablet 2    Prenat-FeFum-FePo-FA-Omega 3 (TARON-C DHA) 35-1 MG  CAPS Take 1 capsule by mouth daily.           Review of Systems   Constitutional: Negative for fever and chills Eyes: Negative for visual disturbances Respiratory: Negative for shortness of breath, dyspnea Cardiovascular: Negative for chest pain or palpitations  Gastrointestinal: Negative for abdominal pain, vomiting, diarrhea and constipation.   Genitourinary: Negative for dysuria and urgency Musculoskeletal: Negative for back pain, joint pain, myalgias  Neurological: Negative for dizziness and headaches      Last menstrual period 03/05/2022. General appearance: alert, cooperative, and no distress Lungs: normal respiratory effort Heart: regular rate and rhythm Abdomen: soft, non-tender; bowel sounds normal Extremities: Homans sign is negative, no sign of DVT DTR's 2+ Presentation: cephalic Fetal monitoring  Baseline: 150 bpm, Variability: Good {> 6 bpm), Accelerations: Reactive, and Decelerations: Absent Uterine activity  None    FHR initially 180s, baby active,  to 150s after IVF bolus.    Prenatal labs: ABO, Rh: O/Positive/-- (10/17 0000) Antibody: Negative (10/17 0000) Rubella: immune RPR: Non Reactive (02/29 0918)  HBsAg: Negative (10/17 0000)  HIV: Non Reactive (02/29 0918)  GBS: Negative/-- (04/24 1212)   Nursing Staff Provider  Office Location [place] Dating  12/10/2022, by Last Menstrual Period  [facility] Model [ x] Traditional [ ]  Centering [ ]  Mom-Baby Dyad    Language  English Anatomy US  Wnl   Flu Vaccine  declined Genetic/Carrier Screen  NIPS:   low risk panorama AFP:    Horizon:  TDaP Vaccine    Hgb A1C or  GTT Early neg a1c Third trimester wnl  COVID Vaccine declined   LAB RESULTS   Rhogam  O/Positive/-- (10/17 0000)  Blood Type O/Positive/-- (10/17 0000) O pos  Baby Feeding Plan breast Antibody Negative (10/17 0000)neg  Contraception none Rubella Immune (10/17 0000)imm  Circumcision  RPR Nonreactive (10/17 0000) neg  Pediatrician   HBsAg Negative (10/17 0000) neg  Support Person Family  HCVAb Negative (10/17 0000) neg  Prenatal Classes  HIV Non-reactive (10/17 0000)   neg  BTL Consent  GBS  Negative  VBAC Consent  Pap Neg 2022       DME Rx [x ] BP cuff [ ]  Weight Scale Waterbirth  [ ]  Class [ ]  Consent [ ]  CNM visit  PHQ9 & GAD7 [  ] new OB [  ] 28 weeks  [  ] 36 weeks Induction  [ ]  Orders Entered [ ] Foley Y/N     Prenatal Transfer Tool  Maternal Diabetes: No Genetic Screening: Normal Maternal Ultrasounds/Referrals: Normal Fetal Ultrasounds or other Referrals:  None Maternal Substance Abuse:  No Significant Maternal Medications:  None Significant Maternal Lab Results: Group B Strep negative    No results found for this or any previous visit (from the past 24 hour(s)).  Assessment: [name] Collier is a 27 y.o. G1P0000 with an IUP at [redacted]w[redacted]d presenting for IOL for GHTN.  Plan: #Labor: Cytotec->Foley->pitocin #Pain:  Per request #FWB Cat 1 #ID: GBS: neg   Jacklyn Shell 11/29/2022, 12:09  AM  admi

## 2022-11-30 ENCOUNTER — Encounter (HOSPITAL_COMMUNITY)
Admission: RE | Disposition: A | Discharge status: Home / Self Care | Payer: Self-pay | Source: Home / Self Care | Attending: Obstetrics and Gynecology

## 2022-11-30 ENCOUNTER — Inpatient Hospital Stay (HOSPITAL_COMMUNITY): Payer: Medicaid Other | Admitting: Anesthesiology

## 2022-11-30 ENCOUNTER — Encounter (HOSPITAL_COMMUNITY): Payer: Self-pay | Admitting: Obstetrics & Gynecology

## 2022-11-30 DIAGNOSIS — O43813 Placental infarction, third trimester: Secondary | ICD-10-CM | POA: Diagnosis not present

## 2022-11-30 DIAGNOSIS — O99214 Obesity complicating childbirth: Secondary | ICD-10-CM | POA: Diagnosis not present

## 2022-11-30 DIAGNOSIS — O164 Unspecified maternal hypertension, complicating childbirth: Secondary | ICD-10-CM | POA: Diagnosis not present

## 2022-11-30 DIAGNOSIS — E669 Obesity, unspecified: Secondary | ICD-10-CM | POA: Diagnosis not present

## 2022-11-30 DIAGNOSIS — O4593 Premature separation of placenta, unspecified, third trimester: Secondary | ICD-10-CM | POA: Diagnosis not present

## 2022-11-30 DIAGNOSIS — O134 Gestational [pregnancy-induced] hypertension without significant proteinuria, complicating childbirth: Secondary | ICD-10-CM | POA: Diagnosis not present

## 2022-11-30 DIAGNOSIS — Z3A36 36 weeks gestation of pregnancy: Secondary | ICD-10-CM

## 2022-11-30 DIAGNOSIS — O411230 Chorioamnionitis, third trimester, not applicable or unspecified: Secondary | ICD-10-CM | POA: Diagnosis not present

## 2022-11-30 DIAGNOSIS — O4590 Premature separation of placenta, unspecified, unspecified trimester: Secondary | ICD-10-CM | POA: Diagnosis not present

## 2022-11-30 DIAGNOSIS — Z3A37 37 weeks gestation of pregnancy: Secondary | ICD-10-CM | POA: Diagnosis not present

## 2022-11-30 DIAGNOSIS — Z98891 History of uterine scar from previous surgery: Secondary | ICD-10-CM

## 2022-11-30 LAB — CBC
HCT: 35.7 % — ABNORMAL LOW (ref 36.0–46.0)
HCT: 33.5 % — ABNORMAL LOW (ref 36.0–46.0)
Hemoglobin: 11.7 g/dL — ABNORMAL LOW (ref 12.0–15.0)
Hemoglobin: 10.8 g/dL — ABNORMAL LOW (ref 12.0–15.0)
MCH: 25.9 pg — ABNORMAL LOW (ref 26.0–34.0)
MCH: 25.8 pg — ABNORMAL LOW (ref 26.0–34.0)
MCHC: 32.8 g/dL (ref 30.0–36.0)
MCHC: 32.2 g/dL (ref 30.0–36.0)
MCV: 79 fL — ABNORMAL LOW (ref 80.0–100.0)
MCV: 80 fL (ref 80.0–100.0)
Platelets: 176 10*3/uL (ref 150–400)
Platelets: 170 10*3/uL (ref 150–400)
RBC: 4.52 MIL/uL (ref 3.87–5.11)
RBC: 4.19 MIL/uL (ref 3.87–5.11)
RDW: 14.3 % (ref 11.5–15.5)
RDW: 14.3 % (ref 11.5–15.5)
WBC: 11.9 10*3/uL — ABNORMAL HIGH (ref 4.0–10.5)
WBC: 18.7 10*3/uL — ABNORMAL HIGH (ref 4.0–10.5)
nRBC: 0 % (ref 0.0–0.2)
nRBC: 0 % (ref 0.0–0.2)

## 2022-11-30 SURGERY — Surgical Case
Anesthesia: Epidural

## 2022-11-30 MED ORDER — MORPHINE SULFATE (PF) 0.5 MG/ML IJ SOLN
INTRAMUSCULAR | Status: DC | PRN
  Administered 2022-11-30: 3 mg via EPIDURAL

## 2022-11-30 MED ORDER — NALOXONE HCL 0.4 MG/ML IJ SOLN: 0.4000 mg | INTRAMUSCULAR | Status: DC | PRN

## 2022-11-30 MED ORDER — SIMETHICONE 80 MG PO CHEW
80.0000 mg | CHEWABLE_TABLET | Freq: Three times a day (TID) | ORAL | Status: DC
  Administered 2022-12-01 – 2022-12-03 (×7): 80 mg via ORAL
  Filled 2022-11-30 (×8): qty 1

## 2022-11-30 MED ORDER — SODIUM CHLORIDE 0.9 % IV SOLN
2.0000 g | Freq: Four times a day (QID) | INTRAVENOUS | Status: AC
  Administered 2022-11-30 – 2022-12-01 (×3): 2 g via INTRAVENOUS
  Filled 2022-11-30 (×3): qty 2000

## 2022-11-30 MED ORDER — SOD CITRATE-CITRIC ACID 500-334 MG/5ML PO SOLN: 30.0000 mL | ORAL | Status: DC

## 2022-11-30 MED ORDER — METOCLOPRAMIDE HCL 5 MG/ML IJ SOLN
INTRAMUSCULAR | Status: AC
  Filled 2022-11-30: qty 2

## 2022-11-30 MED ORDER — IBUPROFEN 600 MG PO TABS
600.0000 mg | ORAL_TABLET | Freq: Four times a day (QID) | ORAL | Status: DC
  Administered 2022-11-30 – 2022-12-03 (×11): 600 mg via ORAL
  Filled 2022-11-30 (×11): qty 1

## 2022-11-30 MED ORDER — DIBUCAINE (PERIANAL) 1 % EX OINT: 1.0000 | TOPICAL_OINTMENT | CUTANEOUS | Status: DC | PRN

## 2022-11-30 MED ORDER — NALOXONE HCL 4 MG/10ML IJ SOLN: 1.0000 ug/kg/h | INTRAVENOUS | Status: DC | PRN

## 2022-11-30 MED ORDER — TRANEXAMIC ACID-NACL 1000-0.7 MG/100ML-% IV SOLN: 1000.0000 mg | Freq: Once | INTRAVENOUS | Status: DC

## 2022-11-30 MED ORDER — MORPHINE SULFATE (PF) 0.5 MG/ML IJ SOLN
INTRAMUSCULAR | Status: AC
  Filled 2022-11-30: qty 10

## 2022-11-30 MED ORDER — ONDANSETRON HCL 4 MG/2ML IJ SOLN: 4.0000 mg | Freq: Three times a day (TID) | INTRAMUSCULAR | Status: DC | PRN

## 2022-11-30 MED ORDER — CEFAZOLIN SODIUM-DEXTROSE 2-3 GM-%(50ML) IV SOLR
INTRAVENOUS | Status: DC | PRN
  Administered 2022-11-30: 2 g via INTRAVENOUS

## 2022-11-30 MED ORDER — DEXAMETHASONE SODIUM PHOSPHATE 10 MG/ML IJ SOLN
INTRAMUSCULAR | Status: AC
  Filled 2022-11-30: qty 1

## 2022-11-30 MED ORDER — FENTANYL CITRATE (PF) 100 MCG/2ML IJ SOLN
INTRAMUSCULAR | Status: DC | PRN
  Administered 2022-11-30: 100 ug via EPIDURAL

## 2022-11-30 MED ORDER — ONDANSETRON HCL 4 MG/2ML IJ SOLN
INTRAMUSCULAR | Status: AC
  Filled 2022-11-30: qty 2

## 2022-11-30 MED ORDER — KETOROLAC TROMETHAMINE 30 MG/ML IJ SOLN
INTRAMUSCULAR | Status: AC
  Filled 2022-11-30: qty 1

## 2022-11-30 MED ORDER — LIDOCAINE-EPINEPHRINE (PF) 2 %-1:200000 IJ SOLN
INTRAMUSCULAR | Status: DC | PRN
  Administered 2022-11-30: 1 mL via EPIDURAL
  Administered 2022-11-30 (×2): 5 mL via EPIDURAL

## 2022-11-30 MED ORDER — OXYTOCIN-SODIUM CHLORIDE 30-0.9 UT/500ML-% IV SOLN: 2.5000 [IU]/h | INTRAVENOUS | Status: AC

## 2022-11-30 MED ORDER — CEFAZOLIN SODIUM-DEXTROSE 2-4 GM/100ML-% IV SOLN: 2.0000 g | INTRAVENOUS | Status: DC

## 2022-11-30 MED ORDER — OXYCODONE HCL 5 MG PO TABS
5.0000 mg | ORAL_TABLET | ORAL | Status: DC | PRN
  Administered 2022-12-01 (×2): 5 mg via ORAL
  Administered 2022-12-01: 10 mg via ORAL
  Administered 2022-12-02 (×2): 5 mg via ORAL
  Administered 2022-12-03: 10 mg via ORAL
  Filled 2022-11-30 (×3): qty 1
  Filled 2022-11-30 (×2): qty 2
  Filled 2022-11-30: qty 1

## 2022-11-30 MED ORDER — LACTATED RINGERS IV SOLN: INTRAVENOUS | Status: DC | PRN

## 2022-11-30 MED ORDER — FENTANYL CITRATE (PF) 100 MCG/2ML IJ SOLN
INTRAMUSCULAR | Status: AC
  Filled 2022-11-30: qty 2

## 2022-11-30 MED ORDER — OXYTOCIN-SODIUM CHLORIDE 30-0.9 UT/500ML-% IV SOLN
INTRAVENOUS | Status: DC | PRN
  Administered 2022-11-30: 999 mL/h via INTRAVENOUS

## 2022-11-30 MED ORDER — DIPHENHYDRAMINE HCL 50 MG/ML IJ SOLN: 12.5000 mg | INTRAMUSCULAR | Status: DC | PRN

## 2022-11-30 MED ORDER — GABAPENTIN 100 MG PO CAPS
200.0000 mg | ORAL_CAPSULE | Freq: Two times a day (BID) | ORAL | Status: DC
  Administered 2022-11-30 – 2022-12-03 (×6): 200 mg via ORAL
  Filled 2022-11-30 (×6): qty 2

## 2022-11-30 MED ORDER — WITCH HAZEL-GLYCERIN EX PADS: 1.0000 | MEDICATED_PAD | CUTANEOUS | Status: DC | PRN

## 2022-11-30 MED ORDER — OXYCODONE HCL 5 MG/5ML PO SOLN: 5.0000 mg | Freq: Once | ORAL | Status: DC | PRN

## 2022-11-30 MED ORDER — POLYETHYLENE GLYCOL 3350 17 G PO PACK
17.0000 g | PACK | Freq: Every day | ORAL | Status: DC
  Administered 2022-12-01 – 2022-12-03 (×3): 17 g via ORAL
  Filled 2022-11-30 (×3): qty 1

## 2022-11-30 MED ORDER — SODIUM CHLORIDE 0.9 % IV SOLN: 500.0000 mg | INTRAVENOUS | Status: DC

## 2022-11-30 MED ORDER — STERILE WATER FOR IRRIGATION IR SOLN
Status: DC | PRN
  Administered 2022-11-30: 1

## 2022-11-30 MED ORDER — ENOXAPARIN SODIUM 60 MG/0.6ML IJ SOSY
0.5000 mg/kg | PREFILLED_SYRINGE | INTRAMUSCULAR | Status: DC
  Administered 2022-12-01 – 2022-12-02 (×2): 55 mg via SUBCUTANEOUS
  Filled 2022-11-30 (×2): qty 0.6

## 2022-11-30 MED ORDER — MENTHOL 3 MG MT LOZG: 1.0000 | LOZENGE | OROMUCOSAL | Status: DC | PRN

## 2022-11-30 MED ORDER — ONDANSETRON HCL 4 MG/2ML IJ SOLN: 4.0000 mg | Freq: Once | INTRAMUSCULAR | Status: DC | PRN

## 2022-11-30 MED ORDER — SCOPOLAMINE 1 MG/3DAYS TD PT72: 1.0000 | MEDICATED_PATCH | Freq: Once | TRANSDERMAL | Status: DC

## 2022-11-30 MED ORDER — PHENYLEPHRINE HCL (PRESSORS) 10 MG/ML IV SOLN
INTRAVENOUS | Status: DC | PRN
  Administered 2022-11-30: 40 ug via INTRAVENOUS

## 2022-11-30 MED ORDER — ONDANSETRON HCL 4 MG/2ML IJ SOLN
INTRAMUSCULAR | Status: DC | PRN
  Administered 2022-11-30: 4 mg via INTRAVENOUS

## 2022-11-30 MED ORDER — DIPHENHYDRAMINE HCL 25 MG PO CAPS
25.0000 mg | ORAL_CAPSULE | ORAL | Status: DC | PRN
  Administered 2022-11-30: 25 mg via ORAL
  Filled 2022-11-30: qty 1

## 2022-11-30 MED ORDER — OXYCODONE HCL 5 MG PO TABS: 5.0000 mg | ORAL_TABLET | Freq: Once | ORAL | Status: DC | PRN

## 2022-11-30 MED ORDER — HYDROMORPHONE HCL 1 MG/ML IJ SOLN: 0.2500 mg | INTRAMUSCULAR | Status: DC | PRN

## 2022-11-30 MED ORDER — OXYTOCIN-SODIUM CHLORIDE 30-0.9 UT/500ML-% IV SOLN
INTRAVENOUS | Status: AC
  Filled 2022-11-30: qty 500

## 2022-11-30 MED ORDER — GENTAMICIN SULFATE 40 MG/ML IJ SOLN
5.0000 mg/kg | Freq: Once | INTRAVENOUS | Status: AC
  Administered 2022-12-01: 390 mg via INTRAVENOUS
  Filled 2022-11-30 (×2): qty 9.75

## 2022-11-30 MED ORDER — FENTANYL-BUPIVACAINE-NACL 0.5-0.125-0.9 MG/250ML-% EP SOLN
EPIDURAL | Status: DC | PRN
  Administered 2022-11-30: 12 mL/h via EPIDURAL

## 2022-11-30 MED ORDER — PRENATAL MULTIVITAMIN CH
1.0000 | ORAL_TABLET | Freq: Every day | ORAL | Status: DC
  Administered 2022-12-01 – 2022-12-03 (×3): 1 via ORAL
  Filled 2022-11-30 (×3): qty 1

## 2022-11-30 MED ORDER — KETOROLAC TROMETHAMINE 30 MG/ML IJ SOLN
30.0000 mg | Freq: Once | INTRAMUSCULAR | Status: AC | PRN
  Administered 2022-11-30: 30 mg via INTRAVENOUS

## 2022-11-30 MED ORDER — CLINDAMYCIN PHOSPHATE 900 MG/50ML IV SOLN
900.0000 mg | Freq: Three times a day (TID) | INTRAVENOUS | Status: AC
  Administered 2022-11-30 – 2022-12-01 (×3): 900 mg via INTRAVENOUS
  Filled 2022-11-30 (×2): qty 50

## 2022-11-30 MED ORDER — SODIUM CHLORIDE 0.9% FLUSH: 3.0000 mL | INTRAVENOUS | Status: DC | PRN

## 2022-11-30 MED ORDER — SODIUM CHLORIDE 0.9 % IV SOLN
INTRAVENOUS | Status: DC | PRN
  Administered 2022-11-30: 500 mg via INTRAVENOUS

## 2022-11-30 MED ORDER — DIPHENHYDRAMINE HCL 25 MG PO CAPS: 25.0000 mg | ORAL_CAPSULE | Freq: Four times a day (QID) | ORAL | Status: DC | PRN

## 2022-11-30 MED ORDER — COCONUT OIL OIL
1.0000 | TOPICAL_OIL | Status: DC | PRN
  Administered 2022-12-01 – 2022-12-03 (×3): 1 via TOPICAL

## 2022-11-30 MED ORDER — LIDOCAINE HCL (PF) 1 % IJ SOLN
INTRAMUSCULAR | Status: DC | PRN
  Administered 2022-11-30: 5 mL via EPIDURAL

## 2022-11-30 MED ORDER — SIMETHICONE 80 MG PO CHEW: 80.0000 mg | CHEWABLE_TABLET | ORAL | Status: DC | PRN

## 2022-11-30 MED ORDER — SENNOSIDES-DOCUSATE SODIUM 8.6-50 MG PO TABS: 2.0000 | ORAL_TABLET | Freq: Every evening | ORAL | Status: DC | PRN

## 2022-11-30 MED ORDER — SODIUM CHLORIDE 0.9 % IR SOLN
Status: DC | PRN
  Administered 2022-11-30: 1

## 2022-11-30 MED ORDER — MEPERIDINE HCL 25 MG/ML IJ SOLN: 6.2500 mg | INTRAMUSCULAR | Status: DC | PRN

## 2022-11-30 MED ORDER — CLINDAMYCIN PHOSPHATE 900 MG/50ML IV SOLN
INTRAVENOUS | Status: AC
  Filled 2022-11-30: qty 50

## 2022-11-30 MED ORDER — LIDOCAINE-EPINEPHRINE (PF) 2 %-1:200000 IJ SOLN
INTRAMUSCULAR | Status: AC
  Filled 2022-11-30: qty 20

## 2022-11-30 SURGICAL SUPPLY — 31 items
APL PRP STRL LF DISP 70% ISPRP (MISCELLANEOUS) ×2
APL SKNCLS STERI-STRIP NONHPOA (GAUZE/BANDAGES/DRESSINGS) ×1
BENZOIN TINCTURE PRP APPL 2/3 (GAUZE/BANDAGES/DRESSINGS) ×1 IMPLANT
CANISTER SUCT 3000ML PPV (MISCELLANEOUS) ×1 IMPLANT
CHLORAPREP W/TINT 26 (MISCELLANEOUS) ×2 IMPLANT
CLAMP UMBILICAL CORD (MISCELLANEOUS) ×1 IMPLANT
DRSG OPSITE POSTOP 4X10 (GAUZE/BANDAGES/DRESSINGS) ×1 IMPLANT
ELECT REM PT RETURN 9FT ADLT (ELECTROSURGICAL) ×1
ELECTRODE REM PT RTRN 9FT ADLT (ELECTROSURGICAL) ×1 IMPLANT
EXTRACTOR VACUUM KIWI (MISCELLANEOUS) ×1 IMPLANT
GLOVE BIOGEL PI IND STRL 7.0 (GLOVE) ×2 IMPLANT
GLOVE BIOGEL PI IND STRL 7.5 (GLOVE) ×1 IMPLANT
GLOVE SKINSENSE STRL SZ7.0 (GLOVE) ×1 IMPLANT
GOWN STRL REUS W/ TWL LRG LVL3 (GOWN DISPOSABLE) ×2 IMPLANT
GOWN STRL REUS W/ TWL XL LVL3 (GOWN DISPOSABLE) ×1 IMPLANT
GOWN STRL REUS W/TWL LRG LVL3 (GOWN DISPOSABLE) ×2
GOWN STRL REUS W/TWL XL LVL3 (GOWN DISPOSABLE) ×1
NS IRRIG 1000ML POUR BTL (IV SOLUTION) ×1 IMPLANT
PACK C SECTION WH (CUSTOM PROCEDURE TRAY) ×1 IMPLANT
PAD ABD 7.5X8 STRL (GAUZE/BANDAGES/DRESSINGS) ×1 IMPLANT
PAD OB MATERNITY 4.3X12.25 (PERSONAL CARE ITEMS) ×1 IMPLANT
PAD PREP 24X48 CUFFED NSTRL (MISCELLANEOUS) ×1 IMPLANT
STRIP CLOSURE SKIN 1/2X4 (GAUZE/BANDAGES/DRESSINGS) ×1 IMPLANT
SUT MNCRL 0 VIOLET CTX 36 (SUTURE) ×2 IMPLANT
SUT MON AB 4-0 PS1 27 (SUTURE) ×1 IMPLANT
SUT PLAIN 2 0 XLH (SUTURE) ×1 IMPLANT
SUT VIC AB 0 CT1 36 (SUTURE) ×2 IMPLANT
SUT VIC AB 3-0 CT1 27 (SUTURE) ×1
SUT VIC AB 3-0 CT1 TAPERPNT 27 (SUTURE) ×1 IMPLANT
TOWEL OR 17X24 6PK STRL BLUE (TOWEL DISPOSABLE) ×2 IMPLANT
WATER STERILE IRR 1000ML POUR (IV SOLUTION) ×1 IMPLANT

## 2022-11-30 NOTE — Discharge Summary (Shared)
Postpartum Discharge Summary  Date of Service updated***     Patient Name: [name] Collier DOB: [birth year] MRN: [id number]  Date of admission: 11/29/2022 Delivery date:11/30/2022  Delivering provider: [place] Bing  Date of discharge: 12/03/2022  Admitting diagnosis: Gestational hypertension, third trimester [O13.3] Intrauterine pregnancy: [redacted]w[redacted]d     Secondary diagnosis:  Principal Problem:   Status post primary low transverse cesarean section Active Problems:   Supervision of high-risk pregnancy   Gestational hypertension w/o significant proteinuria in 3rd trimester   Placental abruption affecting delivery  Additional problems: ***    Discharge diagnosis: {DX.:23714}                                              Post partum procedures:{Postpartum procedures:23558} Augmentation: AROM, Pitocin, Cytotec, and IP Foley Complications: Placental Abruption, persistent fetal tachycardia  Hospital course: Induction of Labor With Cesarean Section   27 y.o. yo G1P0000 at [redacted]w[redacted]d was admitted to the hospital 11/29/2022 for induction of labor. Patient had a labor course significant for recurrent FHR decelerations  and persistent fetal tachycardia, of unclear etiology. The patient went for cesarean section due to Non-Reassuring FHR. Delivery details are as follows: Membrane Rupture Time/Date: 10:48 PM ,11/29/2022   Delivery Method:C-Section, Low Transverse  Details of operation can be found in separate operative Note.  Patient had a postpartum course complicated by***. She is ambulating, tolerating a regular diet, passing flatus, and urinating well.  Patient is discharged home in stable condition on 12/03/22.      Newborn Data: Birth date:11/30/2022  Birth time:4:55 PM  Gender:Female  Living status:Living  Apgars:7 ,9  Weight:3590 g                                Magnesium Sulfate received: {Mag received:30440022} BMZ received: No Rhophylac:N/A MMR:N/A,  T-DaP:{Tdap:23962} Flu:  {WUJ:81191} Transfusion:{Transfusion received:30440034}  Physical exam  Vitals:   12/02/22 1421 12/02/22 1844 12/02/22 2135 12/03/22 0400  BP: 134/71 135/69 128/69 123/75  Pulse: 82 79  76  Resp: 17 18 19 16   Temp: 98.5 F (36.9 C) 98 F (36.7 C) 97.7 F (36.5 C) 98 F (36.7 C)  TempSrc:  Oral Oral Oral  SpO2: 99% 100% 99% 100%  Weight:      Height:       General: {Exam; general:21111117} Lochia: {Desc; appropriate/inappropriate:30686::"appropriate"} Uterine Fundus: {Desc; firm/soft:30687} Incision: {Exam; incision:21111123} DVT Evaluation: {Exam; YNW:2956213} Labs: Lab Results  Component Value Date   WBC 18.6 (H) 12/01/2022   HGB 9.7 (L) 12/01/2022   HCT 30.2 (L) 12/01/2022   MCV 80.3 12/01/2022   PLT 175 12/01/2022      Latest Ref Rng & Units 12/01/2022    5:26 AM  CMP  Creatinine 0.44 - 1.00 mg/dL 0.86    Edinburgh Score:    12/01/2022    8:15 AM  Edinburgh Postnatal Depression Scale Screening Tool  I have been able to laugh and see the funny side of things. 0  I have looked forward with enjoyment to things. 0  I have blamed myself unnecessarily when things went wrong. 0  I have been anxious or worried for no good reason. 0  I have felt scared or panicky for no good reason. 0  Things have been getting on top of me. 1  I have been  so unhappy that I have had difficulty sleeping. 0  I have felt sad or miserable. 0  I have been so unhappy that I have been crying. 0  The thought of harming myself has occurred to me. 0  Edinburgh Postnatal Depression Scale Total 1     After visit meds:  Allergies as of 12/03/2022       Reactions   Black Walnut Flavor Anaphylaxis   Cherry Anaphylaxis, Rash   Tongue tingling   Peach [prunus Persica] Anaphylaxis, Rash   Tongue tingling   Peanut-containing Drug Products Anaphylaxis   Almond (diagnostic) Other (See Comments)   Tongue tingling   Aspirin Other (See Comments)   Nosebleeds   Estonia Nut (berthollefia Excelsa)     Cashew Nut (anacardium Occidentale) Skin Test    Macadamia Nut Oil    Pistachio Nut (diagnostic)         Medication List     STOP taking these medications    NIFEdipine 30 MG 24 hr tablet Commonly known as: Procardia XL       TAKE these medications    albuterol (2.5 MG/3ML) 0.083% nebulizer solution Commonly known as: PROVENTIL Take 3 mLs (2.5 mg total) by nebulization every 6 (six) hours as needed for wheezing or shortness of breath.   cholecalciferol 25 MCG (1000 UNIT) tablet Commonly known as: VITAMIN D3 Take 1,000 Units by mouth daily.   ferrous sulfate 325 (65 FE) MG tablet Take 1 tablet (325 mg total) by mouth every other day for 30 doses.   furosemide 20 MG tablet Commonly known as: LASIX Take 1 tablet (20 mg total) by mouth daily for 3 doses.   gabapentin 100 MG capsule Commonly known as: NEURONTIN Take 2 capsules (200 mg total) by mouth 2 (two) times daily for 7 days.   ibuprofen 600 MG tablet Commonly known as: ADVIL Take 1 tablet (600 mg total) by mouth every 6 (six) hours as needed.   oxyCODONE-acetaminophen 5-325 MG tablet Commonly known as: PERCOCET/ROXICET Take 1-2 tablets by mouth every 6 (six) hours as needed.   polyethylene glycol 17 g packet Commonly known as: MIRALAX / GLYCOLAX Take 17 g by mouth daily as needed.   simethicone 80 MG chewable tablet Commonly known as: MYLICON Chew 1 tablet (80 mg total) by mouth as needed for flatulence.   Taron-C DHA 35-1 MG Caps Take 1 capsule by mouth daily.         Discharge home in stable condition Infant Feeding: {Baby feeding:23562} Infant Disposition:{CHL IP OB HOME WITH ZOXWRU:04540} Discharge instruction: per After Visit Summary and Postpartum booklet. Activity: Advance as tolerated. Pelvic rest for 6 weeks.  Diet: {OB JWJX:91478295} Future Appointments: Future Appointments  Date Time Provider Department Center  12/09/2022  3:50 PM [facility] NURSE [facility]  01/08/2023   3:10 PM [place] Bing, MD [facility]   Follow up Visit:  Follow-up Information     [facility] for [facility]. Go on 12/09/2022.   Specialty: Obstetrics and Gynecology Why: incision and BP check Contact information: [address] [place] [address] [phone]                The following message was sent to [facility] by Gwenyth Allegra, MD  Please schedule this patient for a In person postpartum visit in 6 weeks with the following provider: Any provider. Additional Postpartum F/U:Incision check 1 week and BP check 1 week  High risk pregnancy complicated by: HTN Delivery mode:  C-Section, Low Transverse  Anticipated Birth Control:   declines   12/03/2022 [place] Bing, MD

## 2022-11-30 NOTE — Op Note (Signed)
Operative Note   SURGERY DATE: 11/30/2022  PRE-OP DIAGNOSIS:  *Pregnancy at 37/1 *Non reassuring fetal heart tones *Failed IOL for gestational hypertension.   POST-OP DIAGNOSIS: Same. Delivered. Abrutpion   PROCEDURE: primary low transverse cesarean section via pfannenstiel skin incision with single layer uterine closure  SURGEON: Surgeon(s) and Role:    * [place] Bing, MD - Primary  ASSISTANT:    * Ndulue, Nadene Rubins, MD - Fellow  An experienced assistant was required given the standard of surgical care given the complexity of the case.  This assistant was needed for exposure, dissection, suctioning, retraction, instrument exchange, assisting with delivery with administration of fundal pressure, and for overall help during the procedure.  ANESTHESIA: epidural  ESTIMATED BLOOD LOSS:   DRAINS: 50mL UOP via indwelling foley  TOTAL IV FLUIDS: crystalloid  VTE PROPHYLAXIS: SCDs to bilateral lower extremities  ANTIBIOTICS: Two grams of Cefazolin, azithromycin 500mg  IV, within 1 hour of skin incision and clindamycin 900mg  IV given intraoperatively. Plan is for 24 hours of post op antibiotics of amp, gent and clindamycin  SPECIMENS: placenta to pathology  COMPLICATIONS: none  FINDINGS: No intra-abdominal adhesions were noted. Grossly normal uterus, tubes and ovaries. Dark wine stained colored amniotic fluid, cephalic, direct OP, female infant, weight 3590gm, APGARs 7/9, intact placenta. Arterial Venous***  PROCEDURE IN DETAIL: The patient was taken to the operating room where anesthesia was administered and normal fetal heart tones were confirmed. She was then prepped and draped in the normal fashion in the dorsal supine position with a leftward tilt.  After a time out was performed, a pfannensteil skin incision was made with the scalpel and carried through to the underlying layer of fascia. The fascia was then incised at the midline and this incision was extended  laterally with the mayo scissors. Attention was turned to the superior aspect of the fascial incision which was grasped with the kocher clamps x 2, tented up and the rectus muscles were dissected bluntly and with the bovie. In a similar fashion the inferior aspect of the fascial incision was grasped with the kocher clamps, tented up and the rectus muscles dissected off with the mayo scissors. The rectus muscles were then separated in the midline and the peritoneum was entered bluntly. The bladder blade was inserted and the vesicouterine peritoneum was identified, tented up and entered with the metzenbaum scissors. This incision was extended laterally and the bladder flap was created digitally. The bladder blade was reinserted.  A low transverse hysterotomy was made with the scalpel until the endometrial cavity was breached and the amniotic sac ruptured with the Allis clamp, yielding dark wine colored stained amniotic fluid. This incision was extended bluntly and the infant's head, shoulders and body were delivered atraumatically.The cord was clamped x 2 and cut, and the infant was handed to the awaiting pediatricians, after delayed cord clamping was not done.  The placenta was then gradually expressed from the uterus and then the uterus was exteriorized and cleared of all clots and debris. The hysterotomy was repaired with a running suture of 1-0 monocryl to achieve excellent hemostasis.   The uterus and adnexa were then returned to the abdomen, and the hysterotomy and all operative sites were reinspected and excellent hemostasis was noted after irrigation and suction of the abdomen with warm saline.  The peritoneum was closed with a running stitch of 3-0 Vicryl. The fascia was reapproximated with 0 Vicryl in a simple running fashion bilaterally. The subcutaneous layer was then reapproximated with interrupted  sutures of 2-0 plain gut, and the skin was then closed with 4-0 monocryl, in a subcuticular  fashion.  The patient  tolerated the procedure well. Sponge, lap, needle, and instrument counts were correct x 2. The patient was transferred to the recovery room awake, alert and breathing independently in stable condition.  Cornelia Copa MD Attending Center for [facility])

## 2022-11-30 NOTE — Anesthesia Procedure Notes (Addendum)
Epidural Patient location during procedure: OB Start time: 11/30/2022 7:37 AM End time: 11/30/2022 7:50 AM  Staffing Anesthesiologist: Trevor Iha, MD Performed: anesthesiologist   Preanesthetic Checklist Completed: patient identified, IV checked, site marked, risks and benefits discussed, surgical consent, monitors and equipment checked, pre-op evaluation and timeout performed  Epidural Patient position: sitting Prep: DuraPrep and site prepped and draped Patient monitoring: continuous pulse ox and blood pressure Approach: midline Location: L3-L4 Injection technique: LOR air  Needle:  Needle type: Tuohy  Needle gauge: 17 G Needle length: 9 cm and 9 Needle insertion depth: 8 cm Catheter type: closed end flexible Catheter size: 19 Gauge Catheter at skin depth: 15 cm Test dose: negative  Assessment Events: blood not aspirated, no cerebrospinal fluid, injection not painful, no injection resistance, no paresthesia and negative IV test  Additional Notes Patient identified. Risks/Benefits/Options discussed with patient including but not limited to bleeding, infection, nerve damage, paralysis, failed block, incomplete pain control, headache, blood pressure changes, nausea, vomiting, reactions to medication both or allergic, itching and postpartum back pain. Confirmed with bedside nurse the patient's most recent platelet count. Confirmed with patient that they are not currently taking any anticoagulation, have any bleeding history or any family history of bleeding disorders. Patient expressed understanding and wished to proceed. All questions were answered. Sterile technique was used throughout the entire procedure. Please see nursing notes for vital signs. Test dose was given through epidural needle and negative prior to continuing to dose epidural or start infusion. Warning signs of high block given to the patient including shortness of breath, tingling/numbness in hands, complete motor  block, or any concerning symptoms with instructions to call for help. Patient was given instructions on fall risk and not to get out of bed. All questions and concerns addressed with instructions to call with any issues.  1 Attempt (S) . Patient tolerated procedure well.

## 2022-11-30 NOTE — Progress Notes (Signed)
L&D Note  11/30/2022 - 8:47 AM  26 y.o. G1P0000 [redacted]w[redacted]d. Pregnancy complicated by:  Patient Active Problem List   Diagnosis Date Noted   Gestational hypertension w/o significant proteinuria in 3rd trimester 10/11/2022   Anemia in pregnancy, third trimester 10/11/2022   Threatened preterm labor, antepartum 09/26/2022   Supervision of high-risk pregnancy 08/01/2022   Gonorrhea affecting pregnancy in first trimester 08/01/2022   Urinary tract infection in mother during first trimester of pregnancy 08/01/2022   Obesity in pregnancy 08/01/2022   BMI 36.0-36.9,adult 08/01/2022   PCOS (polycystic ovarian syndrome) 02/22/2015    [name] is admitted for IOL for GHTN   Subjective:  Epidural just placed and patient comfortable  Objective:   Vitals:   11/30/22 0815 11/30/22 0820 11/30/22 0835 11/30/22 0836  BP: 119/72 105/68 116/63   Pulse: 99 98 96   Resp: 16 16 16    Temp:    (!) 97.5 F (36.4 C)  TempSrc:    Oral  Weight:      Height:        Current Vital Signs 24h Vital Sign Ranges  T (!) 97.5 F (36.4 C) Temp  Avg: 97.9 F (36.6 C)  Min: 97.5 F (36.4 C)  Max: 98.5 F (36.9 C)  BP 116/63 BP  Min: 99/56  Max: 148/83  HR 96 Pulse  Avg: 89.1  Min: 72  Max: 108  RR 16 Resp  Avg: 16.2  Min: 16  Max: 18  SaO2     No data recorded       24 Hour I/O Current Shift I/O  Time Ins Outs No intake/output data recorded. No intake/output data recorded.   FHR: 160-165 baseline, +accels, ?rare decels, mod variability Toco: q38m Gen: NAD SVE: 5.5-6/50/vertex, high  Labs:  Recent Labs  Lab 11/26/22 1752 11/29/22 0215 11/30/22 0643  WBC 6.5 8.3 11.9*  HGB 11.3* 11.7* 11.7*  HCT 34.1* 36.2 35.7*  PLT 185 180 176   Recent Labs  Lab 11/26/22 1752 11/29/22 0110  NA 134* 134*  K 3.7 3.6  CL 106 108  CO2 20* 18*  BUN <5* <5*  CREATININE 0.56 0.47  CALCIUM 8.8* 9.3  PROT 6.2* 6.6  BILITOT 0.4 0.4  ALKPHOS 91 96  ALT 21 23  AST 29 24  GLUCOSE 96 99     Medications Current Facility-Administered Medications  Medication Dose Route Frequency Provider Last Rate Last Admin   acetaminophen (TYLENOL) tablet 650 mg  650 mg Oral Q4H PRN Anyanwu, Ugonna A, MD       ampicillin (OMNIPEN) 2 g in sodium chloride 0.9 % 100 mL IVPB  2 g Intravenous Q6H Cam Hai D, CNM 300 mL/hr at 11/30/22 0615 2 g at 11/30/22 0615   diphenhydrAMINE (BENADRYL) injection 12.5 mg  12.5 mg Intravenous Q15 min PRN Lacy Duverney M, DO       ePHEDrine injection 10 mg  10 mg Intravenous PRN Lacy Duverney M, DO       ePHEDrine injection 10 mg  10 mg Intravenous PRN Lacy Duverney M, DO       fentaNYL (SUBLIMAZE) injection 50-100 mcg  50-100 mcg Intravenous Q1H PRN Anyanwu, Ugonna A, MD   100 mcg at 11/30/22 0700   fentaNYL 2 mcg/mL w/ bupivacaine 0.125% in NS 250 mL epidural infusion  12 mL/hr Epidural Continuous PRN Lannie Fields, DO       hydrOXYzine (ATARAX) tablet 50 mg  50 mg Oral Q6H PRN Anyanwu, Jethro Bastos, MD  lactated ringers infusion 500 mL  500 mL Intravenous Once Finucane, Elizabeth M, DO       lactated ringers infusion 500-1,000 mL  500-1,000 mL Intravenous PRN Anyanwu, Jethro Bastos, MD 999 mL/hr at 11/29/22 0646 500 mL at 11/29/22 0646   lactated ringers infusion   Intravenous Continuous Anyanwu, Ugonna A, MD       lidocaine (PF) (XYLOCAINE) 1 % injection 30 mL  30 mL Subcutaneous PRN Anyanwu, Ugonna A, MD       NIFEdipine (PROCARDIA-XL/NIFEDICAL-XL) 24 hr tablet 30 mg  30 mg Oral Daily Cam Hai D, CNM       ondansetron [facility]) injection 4 mg  4 mg Intravenous Q6H PRN Anyanwu, Jethro Bastos, MD   4 mg at 11/29/22 1252   oxyCODONE-acetaminophen (PERCOCET/ROXICET) 5-325 MG per tablet 1 tablet  1 tablet Oral Q4H PRN Anyanwu, Jethro Bastos, MD       oxyCODONE-acetaminophen (PERCOCET/ROXICET) 5-325 MG per tablet 2 tablet  2 tablet Oral Q4H PRN Anyanwu, Jethro Bastos, MD       oxytocin (PITOCIN) IV BOLUS FROM BAG  333 mL Intravenous Once Anyanwu,  Ugonna A, MD       oxytocin (PITOCIN) IV infusion 30 units in NS 500 mL - Premix  2.5 Units/hr Intravenous Continuous Anyanwu, Ugonna A, MD       oxytocin (PITOCIN) IV infusion 30 units in NS 500 mL - Premix  1-40 milli-units/min Intravenous Titrated Donette Larry, CNM 20 mL/hr at 11/30/22 0833 20 milli-units/min at 11/30/22 1610   PHENYLephrine 80 mcg/ml in normal saline Adult IV Push Syringe (For Blood Pressure Support)  80 mcg Intravenous PRN Lacy Duverney M, DO       PHENYLephrine 80 mcg/ml in normal saline Adult IV Push Syringe (For Blood Pressure Support)  80 mcg Intravenous PRN Lacy Duverney M, DO       sodium citrate-citric acid (ORACIT) solution 30 mL  30 mL Oral Q2H PRN Anyanwu, Ugonna A, MD   30 mL at 11/30/22 0228   sodium phosphate (FLEET) 7-19 GM/118ML enema 1 enema  1 enema Rectal Daily PRN Anyanwu, Ugonna A, MD       terbutaline (BRETHINE) injection 0.25 mg  0.25 mg Subcutaneous Once PRN Anyanwu, Ugonna A, MD       zolpidem (AMBIEN) tablet 5 mg  5 mg Oral QHS PRN Cresenzo-Dishmon, Scarlette Calico, CNM   5 mg at 11/29/22 0607   Facility-Administered Medications Ordered in Other Encounters  Medication Dose Route Frequency Provider Last Rate Last Admin   fentaNYL 2 mcg/mL w/ bupivacaine 0.125% in NS 250 mL epidural infusion   Epidural Continuous PRN Trevor Iha, MD 12 mL/hr at 11/30/22 0750 12 mL/hr at 11/30/22 0750   lidocaine (PF) (XYLOCAINE) 1 % injection   Epidural Anesthesia Intra-op Trevor Iha, MD   5 mL at 11/30/22 0747    Assessment & Plan:  Patient stable *Pregnancy: Category I-II tracing due to borderline fetal tachycardia that is unexplained. Overall reassuring EFM. Continue amp/gent *Labor: continue pitocin per protocol, currently at 18. Re-check cervix in a few hours and pt told to let us know if she has any pain or pressure warranting an earlier check *GHTN: no evidence of pre-eclampsia. Continue procardia.  *GBS: negative *Analgesia: epidural in  place and working well  Cornelia Copa. MD Attending Center for [facility])

## 2022-11-30 NOTE — Progress Notes (Signed)
L&D Note  11/30/2022 - 12:45 PM  26 y.o. G1 [redacted]w[redacted]d. Pregnancy complicated by:  Patient Active Problem List   Diagnosis Date Noted   Gestational hypertension w/o significant proteinuria in 3rd trimester 10/11/2022   Anemia in pregnancy, third trimester 10/11/2022   Threatened preterm labor, antepartum 09/26/2022   Supervision of high-risk pregnancy 08/01/2022   Gonorrhea affecting pregnancy in first trimester 08/01/2022   Urinary tract infection in mother during first trimester of pregnancy 08/01/2022   Obesity in pregnancy 08/01/2022   BMI 36.0-36.9,adult 08/01/2022   PCOS (polycystic ovarian syndrome) 02/22/2015    [name] is admitted for IOL for GHTN   Subjective:  Patient unable to tell about contractions  Objective:   Vitals:   11/30/22 1100 11/30/22 1130 11/30/22 1200 11/30/22 1218  BP: (!) 105/54 (!) 114/59 (!) 120/57 133/75  Pulse: 92 90 92 (!) 127  Resp: 16 16 16 16   Temp:      TempSrc:      Weight:      Height:        Current Vital Signs 24h Vital Sign Ranges  T 98.1 F (36.7 C) Temp  Avg: 97.9 F (36.6 C)  Min: 97.5 F (36.4 C)  Max: 98.3 F (36.8 C)  BP 133/75 BP  Min: 99/56  Max: 148/83  HR (!) 127 Pulse  Avg: 92.2  Min: 72  Max: 127  RR 16 Resp  Avg: 16.1  Min: 16  Max: 18  SaO2     No data recorded       24 Hour I/O Current Shift I/O  Time Ins Outs No intake/output data recorded. 05/04 0701 - 05/04 1900 In: -  Out: 775 [Urine:775]   FHR: 160-165 baseline, +accels, ?rare decels, mod variability Toco: q46m Gen: mild to moderate distress with contractions SVE: unchanaged at 5.5-6/50/vertex, high IUPC placed  Labs:  Recent Labs  Lab 11/26/22 1752 11/29/22 0215 11/30/22 0643  WBC 6.5 8.3 11.9*  HGB 11.3* 11.7* 11.7*  HCT 34.1* 36.2 35.7*  PLT 185 180 176    Recent Labs  Lab 11/26/22 1752 11/29/22 0110  NA 134* 134*  K 3.7 3.6  CL 106 108  CO2 20* 18*  BUN <5* <5*  CREATININE 0.56 0.47  CALCIUM 8.8* 9.3  PROT 6.2* 6.6   BILITOT 0.4 0.4  ALKPHOS 91 96  ALT 21 23  AST 29 24  GLUCOSE 96 99    Medications Current Facility-Administered Medications  Medication Dose Route Frequency Provider Last Rate Last Admin   acetaminophen (TYLENOL) tablet 650 mg  650 mg Oral Q4H PRN Anyanwu, Ugonna A, MD       ampicillin (OMNIPEN) 2 g in sodium chloride 0.9 % 100 mL IVPB  2 g Intravenous Q6H Cam Hai D, CNM 300 mL/hr at 11/30/22 1151 2 g at 11/30/22 1151   diphenhydrAMINE (BENADRYL) injection 12.5 mg  12.5 mg Intravenous Q15 min PRN Lacy Duverney M, DO       ePHEDrine injection 10 mg  10 mg Intravenous PRN Lacy Duverney M, DO       ePHEDrine injection 10 mg  10 mg Intravenous PRN Lacy Duverney M, DO       fentaNYL (SUBLIMAZE) injection 50-100 mcg  50-100 mcg Intravenous Q1H PRN Anyanwu, Ugonna A, MD   100 mcg at 11/30/22 0700   fentaNYL 2 mcg/mL w/ bupivacaine 0.125% in NS 250 mL epidural infusion  12 mL/hr Epidural Continuous PRN Lannie Fields, DO  hydrOXYzine (ATARAX) tablet 50 mg  50 mg Oral Q6H PRN Anyanwu, Ugonna A, MD       lactated ringers infusion 500 mL  500 mL Intravenous Once Finucane, Elizabeth M, DO       lactated ringers infusion 500-1,000 mL  500-1,000 mL Intravenous PRN Anyanwu, Jethro Bastos, MD 999 mL/hr at 11/30/22 1038 500 mL at 11/30/22 1038   lactated ringers infusion   Intravenous Continuous Anyanwu, Ugonna A, MD       lidocaine (PF) (XYLOCAINE) 1 % injection 30 mL  30 mL Subcutaneous PRN Anyanwu, Ugonna A, MD       NIFEdipine (PROCARDIA-XL/NIFEDICAL-XL) 24 hr tablet 30 mg  30 mg Oral Daily Cam Hai D, CNM       ondansetron [facility]) injection 4 mg  4 mg Intravenous Q6H PRN Anyanwu, Jethro Bastos, MD   4 mg at 11/29/22 1252   oxytocin (PITOCIN) IV BOLUS FROM BAG  333 mL Intravenous Once Anyanwu, Ugonna A, MD       oxytocin (PITOCIN) IV infusion 30 units in NS 500 mL - Premix  2.5 Units/hr Intravenous Continuous Anyanwu, Ugonna A, MD       oxytocin (PITOCIN) IV infusion  30 units in NS 500 mL - Premix  1-40 milli-units/min Intravenous Titrated Donette Larry, CNM 26 mL/hr at 11/30/22 1145 26 milli-units/min at 11/30/22 1145   PHENYLephrine 80 mcg/ml in normal saline Adult IV Push Syringe (For Blood Pressure Support)  80 mcg Intravenous PRN Lacy Duverney M, DO       PHENYLephrine 80 mcg/ml in normal saline Adult IV Push Syringe (For Blood Pressure Support)  80 mcg Intravenous PRN Lannie Fields, DO   80 mcg at 11/30/22 1040   sodium citrate-citric acid (ORACIT) solution 30 mL  30 mL Oral Q2H PRN Anyanwu, Ugonna A, MD   30 mL at 11/30/22 0228   sodium phosphate (FLEET) 7-19 GM/118ML enema 1 enema  1 enema Rectal Daily PRN Anyanwu, Ugonna A, MD       terbutaline (BRETHINE) injection 0.25 mg  0.25 mg Subcutaneous Once PRN Anyanwu, Ugonna A, MD       zolpidem (AMBIEN) tablet 5 mg  5 mg Oral QHS PRN Cresenzo-Dishmon, Scarlette Calico, CNM   5 mg at 11/29/22 0607   Facility-Administered Medications Ordered in Other Encounters  Medication Dose Route Frequency Provider Last Rate Last Admin   fentaNYL 2 mcg/mL w/ bupivacaine 0.125% in NS 250 mL epidural infusion   Epidural Continuous PRN Trevor Iha, MD 12 mL/hr at 11/30/22 0750 12 mL/hr at 11/30/22 0750   lidocaine (PF) (XYLOCAINE) 1 % injection   Epidural Anesthesia Intra-op Trevor Iha, MD   5 mL at 11/30/22 0747    Assessment & Plan:  Patient stable *Pregnancy: Category I-II tracing due to borderline fetal tachycardia that is unexplained. Overall reassuring EFM. Continue amp/gent *Labor: patient appears uncomfortable with UCs; hopefully transitioning into active labor. continue pitocin per protocol. Marland Kitchen Re-check cervix in a few hours and pt told to let us know if she has any pain or pressure warranting an earlier check *GHTN: no evidence of pre-eclampsia. Continue procardia.  *GBS: negative *Analgesia: epidural in place and working well  [name] Collier. MD Attending Center for [facility]  [facility])

## 2022-11-30 NOTE — Progress Notes (Signed)
Patient ID: [name] Collier, female   DOB: 10-01-1995, 27 y.o.   MRN: [id number]  Bouncing on ball; states ctx are still about the same intensity as previously; Tylenol, Amp/Gent all given  BPs 138/84, 148/83, 128/75, T 98.3 FHR 160-165, +accels, no decels Ctx q 3-5 mins with Pit at 15mu/min Cx deferred  IUP@37 .1wk gHTN IOL process Fetal tachycardia improved  Continue keeping ctx reg with Pitocin Cervical exams based on pt's body language/expression of discomfort  Arabella Merles CNM 11/30/2022 1:08 AM

## 2022-11-30 NOTE — Progress Notes (Signed)
At bedside to review management.  Discussed our goals- "healthy baby, healthy mom."  Reviewed her and family's concerns regarding the FHT.  Discussed that while baby is tachycardic- moderate variability with accels.  Discussed concerns regarding antibiotics- reviewed that while mom is afebrile.  It is possible that the tachycardia is due to infection as there are no other causes identified.  After much discussion, agreeable to continue with antibiotics and titration of Pitocin.  Myna Hidalgo, DO Attending Obstetrician & Gynecologist, [facility] for [employer], [facility]

## 2022-11-30 NOTE — Progress Notes (Signed)
Patient ID: [name] Collier, female   DOB: [birth year], 27 y.o.   MRN: [id number]  In bed with peanut and nitrous; mother has some concerns over not seeing the same providers who know her (from the office), and is also requesting her cervical exams to be done by the midwife  BPs 130/76, 125/74 FHR 170s, +accels, occ mi variables Ctx 3-5 mins with Pit at 22mu/min Cx 5+/5-/vtx -3  IUP@37 .1wks gHTN IOL process Fetal tachycardia  Fetal heart rate varies between baseline 160-170s but there isn't a loss of variability or a concern with decels Continue titrating Pit to keep ctx regular  Arabella Merles CNM 11/30/2022 4:05 AM

## 2022-11-30 NOTE — Anesthesia Postprocedure Evaluation (Signed)
Anesthesia Post Note  Patient: [name] Collier  Procedure(s) Performed: CESAREAN SECTION     Patient location during evaluation: Mother Baby Anesthesia Type: Epidural Level of consciousness: oriented and awake and alert Pain management: pain level controlled Vital Signs Assessment: post-procedure vital signs reviewed and stable Respiratory status: spontaneous breathing and respiratory function stable Cardiovascular status: blood pressure returned to baseline and stable Postop Assessment: no headache, no backache, no apparent nausea or vomiting and able to ambulate Anesthetic complications: no  No notable events documented.  Last Vitals:  Vitals:   11/30/22 1830 11/30/22 1845  BP: (!) 127/53 124/66  Pulse: 97 88  Resp: (!) 21 20  Temp: 36.6 C   SpO2: 96% 97%    Last Pain:  Vitals:   11/30/22 1845  TempSrc:   PainSc: 2    Pain Goal: Patients Stated Pain Goal: 0 (11/30/22 1330)  LLE Motor Response: Purposeful movement (11/30/22 1845) LLE Sensation: Tingling (11/30/22 1845) RLE Motor Response: Purposeful movement (11/30/22 1845) RLE Sensation: Tingling (11/30/22 1845)     Epidural/Spinal Function Cutaneous sensation: Pins and Needles (11/30/22 1830), Patient able to flex knees: Yes (11/30/22 1830), Patient able to lift hips off bed: No (11/30/22 1830), Back pain beyond tenderness at insertion site: No (11/30/22 1830), Progressively worsening motor and/or sensory loss: No (11/30/22 1830), Bowel and/or bladder incontinence post epidural: No (11/30/22 1830)  Trevor Iha

## 2022-11-30 NOTE — Anesthesia Preprocedure Evaluation (Addendum)
Anesthesia Evaluation  Patient identified by MRN, date of birth, ID band Patient awake    Reviewed: Allergy & Precautions, H&P , NPO status , Patient's Chart, lab work & pertinent test results  Airway Mallampati: III  TM Distance: >3 FB Neck ROM: Full    Dental no notable dental hx. (+) Teeth Intact, Dental Advisory Given   Pulmonary neg pulmonary ROS   Pulmonary exam normal breath sounds clear to auscultation       Cardiovascular hypertension, negative cardio ROS Normal cardiovascular exam Rhythm:Regular Rate:Normal     Neuro/Psych negative neurological ROS  negative psych ROS   GI/Hepatic negative GI ROS, Neg liver ROS,,,  Endo/Other  negative endocrine ROS    Renal/GU negative Renal ROS  negative genitourinary   Musculoskeletal negative musculoskeletal ROS (+)    Abdominal  (+) + obese (BMI 38.4)  Peds negative pediatric ROS (+)  Hematology negative hematology ROS (+) Lab Results      Component                Value               Date                      WBC                      11.9 (H)            11/30/2022                HGB                      11.7 (L)            11/30/2022                HCT                      35.7 (L)            11/30/2022                MCV                      79.0 (L)            11/30/2022                PLT                      176                 11/30/2022              Anesthesia Other Findings All: ASA  Reproductive/Obstetrics negative OB ROS (+) Pregnancy                             Anesthesia Physical Anesthesia Plan  ASA: 3  Anesthesia Plan: Epidural   Post-op Pain Management:    Induction:   PONV Risk Score and Plan:   Airway Management Planned:   Additional Equipment:   Intra-op Plan:   Post-operative Plan:   Informed Consent: I have reviewed the patients History and Physical, chart, labs and discussed the procedure including  the risks, benefits and alternatives for the proposed anesthesia with the patient or authorized representative who has indicated his/her understanding and  acceptance.     Dental advisory given  Plan Discussed with:   Anesthesia Plan Comments: (37 wk Primagravida w g Htn for LEA)       Anesthesia Quick Evaluation

## 2022-11-30 NOTE — Progress Notes (Signed)
L&D Note  11/30/2022 - 2:18 PM  27 y.o. G1 [redacted]w[redacted]d. Pregnancy complicated by:  Patient Active Problem List   Diagnosis Date Noted   Gestational hypertension w/o significant proteinuria in 3rd trimester 10/11/2022   Anemia in pregnancy, third trimester 10/11/2022   Threatened preterm labor, antepartum 09/26/2022   Supervision of high-risk pregnancy 08/01/2022   Gonorrhea affecting pregnancy in first trimester 08/01/2022   Urinary tract infection in mother during first trimester of pregnancy 08/01/2022   Obesity in pregnancy 08/01/2022   BMI 36.0-36.9,adult 08/01/2022   PCOS (polycystic ovarian syndrome) 02/22/2015    [name] is admitted for IOL for GHTN   Subjective:  Patient comfortable  Objective:   Vitals:   11/30/22 1200 11/30/22 1218 11/30/22 1300 11/30/22 1330  BP: (!) 120/57 133/75 122/60 133/77  Pulse: 92 (!) 127 (!) 144 (!) 114  Resp: 16 16 16 16   Temp:      TempSrc:      Weight:      Height:        Current Vital Signs 24h Vital Sign Ranges  T 98.1 F (36.7 C) Temp  Avg: 97.9 F (36.6 C)  Min: 97.5 F (36.4 C)  Max: 98.3 F (36.8 C)  BP 133/77 BP  Min: 99/56  Max: 148/83  HR (!) 114 Pulse  Avg: 94.6  Min: 72  Max: 144  RR 16 Resp  Avg: 16.1  Min: 16  Max: 18  SaO2     No data recorded       24 Hour I/O Current Shift I/O  Time Ins Outs No intake/output data recorded. 05/04 0701 - 05/04 1900 In: -  Out: 775 [Urine:775]   FHR: 170 baseline, no accels, late decels decels, mod variability Toco: q3-76m Gen: mild distress with contractions SVE: unchanaged at 5.5-6/50/vertex, high IUPC in place, MVUs approx 180-200. Dark blood in the catheter  Labs:  Recent Labs  Lab 11/26/22 1752 11/29/22 0215 11/30/22 0643  WBC 6.5 8.3 11.9*  HGB 11.3* 11.7* 11.7*  HCT 34.1* 36.2 35.7*  PLT 185 180 176    Recent Labs  Lab 11/26/22 1752 11/29/22 0110  NA 134* 134*  K 3.7 3.6  CL 106 108  CO2 20* 18*  BUN <5* <5*  CREATININE 0.56 0.47  CALCIUM  8.8* 9.3  PROT 6.2* 6.6  BILITOT 0.4 0.4  ALKPHOS 91 96  ALT 21 23  AST 29 24  GLUCOSE 96 99    Medications Current Facility-Administered Medications  Medication Dose Route Frequency Provider Last Rate Last Admin   acetaminophen (TYLENOL) tablet 650 mg  650 mg Oral Q4H PRN Anyanwu, Ugonna A, MD   650 mg at 11/30/22 1348   ampicillin (OMNIPEN) 2 g in sodium chloride 0.9 % 100 mL IVPB  2 g Intravenous Q6H Cam Hai D, CNM 300 mL/hr at 11/30/22 1151 2 g at 11/30/22 1151   diphenhydrAMINE (BENADRYL) injection 12.5 mg  12.5 mg Intravenous Q15 min PRN Lacy Duverney M, DO       ePHEDrine injection 10 mg  10 mg Intravenous PRN Lacy Duverney M, DO       ePHEDrine injection 10 mg  10 mg Intravenous PRN Lacy Duverney M, DO       fentaNYL (SUBLIMAZE) injection 50-100 mcg  50-100 mcg Intravenous Q1H PRN Anyanwu, Ugonna A, MD   100 mcg at 11/30/22 0700   fentaNYL 2 mcg/mL w/ bupivacaine 0.125% in NS 250 mL epidural infusion  12 mL/hr Epidural Continuous PRN  Lacy Duverney M, DO       hydrOXYzine (ATARAX) tablet 50 mg  50 mg Oral Q6H PRN Anyanwu, Ugonna A, MD       lactated ringers infusion 500 mL  500 mL Intravenous Once Finucane, Elizabeth M, DO       lactated ringers infusion 500-1,000 mL  500-1,000 mL Intravenous PRN Anyanwu, Jethro Bastos, MD 999 mL/hr at 11/30/22 1038 500 mL at 11/30/22 1038   lactated ringers infusion   Intravenous Continuous Anyanwu, Ugonna A, MD       lidocaine (PF) (XYLOCAINE) 1 % injection 30 mL  30 mL Subcutaneous PRN Anyanwu, Ugonna A, MD       NIFEdipine (PROCARDIA-XL/NIFEDICAL-XL) 24 hr tablet 30 mg  30 mg Oral Daily Cam Hai D, CNM       ondansetron [facility]) injection 4 mg  4 mg Intravenous Q6H PRN Anyanwu, Jethro Bastos, MD   4 mg at 11/29/22 1252   oxytocin (PITOCIN) IV BOLUS FROM BAG  333 mL Intravenous Once Anyanwu, Ugonna A, MD       oxytocin (PITOCIN) IV infusion 30 units in NS 500 mL - Premix  2.5 Units/hr Intravenous Continuous Anyanwu,  Ugonna A, MD       oxytocin (PITOCIN) IV infusion 30 units in NS 500 mL - Premix  1-40 milli-units/min Intravenous Titrated Donette Larry, CNM 26 mL/hr at 11/30/22 1145 26 milli-units/min at 11/30/22 1145   PHENYLephrine 80 mcg/ml in normal saline Adult IV Push Syringe (For Blood Pressure Support)  80 mcg Intravenous PRN Lacy Duverney M, DO       PHENYLephrine 80 mcg/ml in normal saline Adult IV Push Syringe (For Blood Pressure Support)  80 mcg Intravenous PRN Lannie Fields, DO   80 mcg at 11/30/22 1040   sodium citrate-citric acid (ORACIT) solution 30 mL  30 mL Oral Q2H PRN Anyanwu, Ugonna A, MD   30 mL at 11/30/22 0228   sodium phosphate (FLEET) 7-19 GM/118ML enema 1 enema  1 enema Rectal Daily PRN Anyanwu, Ugonna A, MD       terbutaline (BRETHINE) injection 0.25 mg  0.25 mg Subcutaneous Once PRN Anyanwu, Ugonna A, MD       zolpidem (AMBIEN) tablet 5 mg  5 mg Oral QHS PRN Cresenzo-Dishmon, Scarlette Calico, CNM   5 mg at 11/29/22 0607   Facility-Administered Medications Ordered in Other Encounters  Medication Dose Route Frequency Provider Last Rate Last Admin   fentaNYL 2 mcg/mL w/ bupivacaine 0.125% in NS 250 mL epidural infusion   Epidural Continuous PRN Trevor Iha, MD 12 mL/hr at 11/30/22 0750 12 mL/hr at 11/30/22 0750   lidocaine (PF) (XYLOCAINE) 1 % injection   Epidural Anesthesia Intra-op Trevor Iha, MD   5 mL at 11/30/22 0747    Assessment & Plan:  Patient stable *Pregnancy: Category II tracing due to borderline fetal tachycardia that is unexplained.  *Labor: I d/w her that I recommend a c-section given now overt chorio along with no change in cervical exam with adequate MVUs and lates; also concern for something occurring with the placenta as etiology for the tachycardia on admission given blood in catheter. Patient would like to wait to see if her cervix changes. I told her I'm not optimistic that it will but she would still like to wait. Will d/c pitocin since  still having lates with the tachycardia. Can consider restarting the pit if strip improves.  *Chorio: temp in room now 100.4; see above. Wrote for clinda at c/s in addition  to ancef and azithro. Pt also ordered for another 24 hours of amp and gent.  *GHTN: no evidence of pre-eclampsia. Continue procardia.  *GBS: negative *Analgesia: epidural in place and working well  Cornelia Copa. MD Attending Center for [facility])

## 2022-11-30 NOTE — Progress Notes (Signed)
L&D Note  11/30/2022 - 4:05 PM  26 y.o. G1 [redacted]w[redacted]d. Pregnancy complicated by:  Patient Active Problem List   Diagnosis Date Noted   Gestational hypertension w/o significant proteinuria in 3rd trimester 10/11/2022   Anemia in pregnancy, third trimester 10/11/2022   Threatened preterm labor, antepartum 09/26/2022   Supervision of high-risk pregnancy 08/01/2022   Gonorrhea affecting pregnancy in first trimester 08/01/2022   Urinary tract infection in mother during first trimester of pregnancy 08/01/2022   Obesity in pregnancy 08/01/2022   BMI 36.0-36.9,adult 08/01/2022   PCOS (polycystic ovarian syndrome) 02/22/2015    [name] is admitted for IOL for GHTN   Subjective:  Patient comfortable  Objective:   Vitals:   11/30/22 1420 11/30/22 1500 11/30/22 1527 11/30/22 1601  BP: 122/69 119/67  (!) 113/92  Pulse: (!) 102 95  91  Resp:  16  16  Temp: (!) 100.4 F (38 C)  97.7 F (36.5 C)   TempSrc: Axillary  Oral   Weight:      Height:        Current Vital Signs 24h Vital Sign Ranges  T 97.7 F (36.5 C) Temp  Avg: 98.2 F (36.8 C)  Min: 97.5 F (36.4 C)  Max: 100.4 F (38 C)  BP (!) 113/92 BP  Min: 92/79  Max: 148/83  HR 91 Pulse  Avg: 96  Min: 72  Max: 144  RR 16 Resp  Avg: 16.1  Min: 16  Max: 18  SaO2     No data recorded       24 Hour I/O Current Shift I/O  Time Ins Outs No intake/output data recorded. 05/04 0701 - 05/04 1900 In: -  Out: 1525 [Urine:1525]   FHR: 170 baseline, no accels, occasional late decels decels, mod variability Toco: q67m Gen: mild distress with contractions SVE: unchanaged at 5.5-6/50/vertex, high IUPC in place, MVUs approx 100. Dark blood in the catheter  Labs:  Recent Labs  Lab 11/26/22 1752 11/29/22 0215 11/30/22 0643  WBC 6.5 8.3 11.9*  HGB 11.3* 11.7* 11.7*  HCT 34.1* 36.2 35.7*  PLT 185 180 176    Recent Labs  Lab 11/26/22 1752 11/29/22 0110  NA 134* 134*  K 3.7 3.6  CL 106 108  CO2 20* 18*  BUN <5* <5*   CREATININE 0.56 0.47  CALCIUM 8.8* 9.3  PROT 6.2* 6.6  BILITOT 0.4 0.4  ALKPHOS 91 96  ALT 21 23  AST 29 24  GLUCOSE 96 99    Medications Current Facility-Administered Medications  Medication Dose Route Frequency Provider Last Rate Last Admin   acetaminophen (TYLENOL) tablet 650 mg  650 mg Oral Q4H PRN Anyanwu, Ugonna A, MD   650 mg at 11/30/22 1348   ampicillin (OMNIPEN) 2 g in sodium chloride 0.9 % 100 mL IVPB  2 g Intravenous Q6H [place] Bing, MD       diphenhydrAMINE (BENADRYL) injection 12.5 mg  12.5 mg Intravenous Q15 min PRN Lacy Duverney M, DO       ePHEDrine injection 10 mg  10 mg Intravenous PRN Lacy Duverney M, DO       ePHEDrine injection 10 mg  10 mg Intravenous PRN Lacy Duverney M, DO       fentaNYL (SUBLIMAZE) injection 50-100 mcg  50-100 mcg Intravenous Q1H PRN Anyanwu, Ugonna A, MD   100 mcg at 11/30/22 0700   fentaNYL 2 mcg/mL w/ bupivacaine 0.125% in NS 250 mL epidural infusion  12 mL/hr Epidural Continuous PRN Finucane,  Tennis Must, DO       gentamicin (GARAMYCIN) 390 mg in dextrose 5 % 50 mL IVPB  5 mg/kg (Adjusted) Intravenous Once [place] Bing, MD       hydrOXYzine (ATARAX) tablet 50 mg  50 mg Oral Q6H PRN Anyanwu, Ugonna A, MD       lactated ringers infusion 500 mL  500 mL Intravenous Once Finucane, Elizabeth M, DO       lactated ringers infusion 500-1,000 mL  500-1,000 mL Intravenous PRN Anyanwu, Jethro Bastos, MD 999 mL/hr at 11/30/22 1434 500 mL at 11/30/22 1434   lactated ringers infusion   Intravenous Continuous Anyanwu, Ugonna A, MD       lidocaine (PF) (XYLOCAINE) 1 % injection 30 mL  30 mL Subcutaneous PRN Anyanwu, Ugonna A, MD       NIFEdipine (PROCARDIA-XL/NIFEDICAL-XL) 24 hr tablet 30 mg  30 mg Oral Daily Cam Hai D, CNM       ondansetron [facility]) injection 4 mg  4 mg Intravenous Q6H PRN Anyanwu, Jethro Bastos, MD   4 mg at 11/29/22 1252   oxytocin (PITOCIN) IV BOLUS FROM BAG  333 mL Intravenous Once Anyanwu, Ugonna A, MD        oxytocin (PITOCIN) IV infusion 30 units in NS 500 mL - Premix  2.5 Units/hr Intravenous Continuous Anyanwu, Ugonna A, MD       oxytocin (PITOCIN) IV infusion 30 units in NS 500 mL - Premix  1-40 milli-units/min Intravenous Titrated Donette Larry, CNM   Stopped at 11/30/22 1434   PHENYLephrine 80 mcg/ml in normal saline Adult IV Push Syringe (For Blood Pressure Support)  80 mcg Intravenous PRN Lacy Duverney M, DO       PHENYLephrine 80 mcg/ml in normal saline Adult IV Push Syringe (For Blood Pressure Support)  80 mcg Intravenous PRN Lannie Fields, DO   80 mcg at 11/30/22 1040   sodium citrate-citric acid (ORACIT) solution 30 mL  30 mL Oral Q2H PRN Anyanwu, Ugonna A, MD   30 mL at 11/30/22 0228   sodium phosphate (FLEET) 7-19 GM/118ML enema 1 enema  1 enema Rectal Daily PRN Anyanwu, Ugonna A, MD       terbutaline (BRETHINE) injection 0.25 mg  0.25 mg Subcutaneous Once PRN Anyanwu, Ugonna A, MD       zolpidem (AMBIEN) tablet 5 mg  5 mg Oral QHS PRN Cresenzo-Dishmon, Scarlette Calico, CNM   5 mg at 11/29/22 0607   Facility-Administered Medications Ordered in Other Encounters  Medication Dose Route Frequency Provider Last Rate Last Admin   fentaNYL 2 mcg/mL w/ bupivacaine 0.125% in NS 250 mL epidural infusion   Epidural Continuous PRN Trevor Iha, MD 12 mL/hr at 11/30/22 0750 12 mL/hr at 11/30/22 0750   lidocaine (PF) (XYLOCAINE) 1 % injection   Epidural Anesthesia Intra-op Trevor Iha, MD   5 mL at 11/30/22 0747    Assessment & Plan:  Patient stable *Pregnancy: Category II tracing due to borderline fetal tachycardia that is unexplained.  *Labor: no change in cervix. Baby looks better since pitocin has been off but she is contracting on her own and baby is still high. Patient amenable to proceeding with c-section.  *Chorio: temp in room now 100.4; see above. Wrote for clinda at c/s in addition to ancef and azithro. Pt also ordered for another 24 hours of amp and gent.  *GHTN: no  evidence of pre-eclampsia. Continue procardia.  *GBS: negative *Analgesia: epidural in place and working well   Bing, Montez Hageman.  MD Attending Center for [employer])

## 2022-11-30 NOTE — Transfer of Care (Signed)
Immediate Anesthesia Transfer of Care Note  Patient: [name] Collier  Procedure(s) Performed: CESAREAN SECTION  Patient Location: PACU  Anesthesia Type:Regional and Epidural  Level of Consciousness: awake, alert , and oriented  Airway & Oxygen Therapy: Patient Spontanous Breathing  Post-op Assessment: Report given to RN and Post -op Vital signs reviewed and stable  Post vital signs: Reviewed and stable  Last Vitals:  Vitals Value Taken Time  BP 112/65 11/30/22 1745  Temp    Pulse 98 11/30/22 1746  Resp 15 11/30/22 1746  SpO2 100 % 11/30/22 1746  Vitals shown include unvalidated device data.  Last Pain:  Vitals:   11/30/22 1527  TempSrc: Oral  PainSc:       Patients Stated Pain Goal: 0 (11/30/22 1330)  Complications: No notable events documented.

## 2022-12-01 LAB — CREATININE, SERUM
Creatinine, Ser: 0.55 mg/dL (ref 0.44–1.00)
GFR, Estimated: >60 mL/min (ref >60)

## 2022-12-01 LAB — CBC
HCT: 30.2 % — ABNORMAL LOW (ref 36.0–46.0)
Hemoglobin: 9.7 g/dL — ABNORMAL LOW (ref 12.0–15.0)
MCH: 25.8 pg — ABNORMAL LOW (ref 26.0–34.0)
MCHC: 32.1 g/dL (ref 30.0–36.0)
MCV: 80.3 fL (ref 80.0–100.0)
Platelets: 175 10*3/uL (ref 150–400)
RBC: 3.76 MIL/uL — ABNORMAL LOW (ref 3.87–5.11)
RDW: 14.5 % (ref 11.5–15.5)
WBC: 18.6 10*3/uL — ABNORMAL HIGH (ref 4.0–10.5)
nRBC: 0 % (ref 0.0–0.2)

## 2022-12-01 MED ORDER — ACETAMINOPHEN 325 MG PO TABS
650.0000 mg | ORAL_TABLET | Freq: Four times a day (QID) | ORAL | Status: DC
  Administered 2022-12-01 – 2022-12-03 (×10): 650 mg via ORAL
  Filled 2022-12-01 (×10): qty 2

## 2022-12-01 MED ORDER — FERROUS SULFATE 325 (65 FE) MG PO TABS
325.0000 mg | ORAL_TABLET | ORAL | Status: DC
  Administered 2022-12-01 – 2022-12-03 (×2): 325 mg via ORAL
  Filled 2022-12-01 (×2): qty 1

## 2022-12-01 MED ORDER — FUROSEMIDE 20 MG PO TABS
20.0000 mg | ORAL_TABLET | Freq: Every day | ORAL | Status: DC
  Administered 2022-12-01 – 2022-12-03 (×3): 20 mg via ORAL
  Filled 2022-12-01 (×3): qty 1

## 2022-12-01 NOTE — Lactation Note (Signed)
This note was copied from a baby's chart. Lactation Consultation Note  Patient Name: [name] Collier Today's Date: 12/01/2022 Age:25 hours Reason for consult: Initial assessment;1st time breastfeeding;Early term 37-38.6wks (See Birth Parent MR-GHTN and C/S delivery).  P1, ETI female. Birth Parent latched infant on her right breast using the football hold position, LC adjusted pillow and hand position support, infant was still breastfeeding after 19 minutes when LC left the room. Birth Parent will continue to breastfeed infant by cues, on demand, 8 to 12+ times within 24 hours, STS. LC discussed infant's input and output. LC discussed importance of maternal rest, diet and hydration. Birth Parent knows to call RN/LC for further latch assistance if needed. Birth Parent was made aware of O/P services, breastfeeding support groups, community resources, and our phone # for post-discharge questions.    Maternal Data Has patient been taught Hand Expression?: Yes Does the patient have breastfeeding experience prior to this delivery?: No  Feeding Mother's Current Feeding Choice: Breast Milk  LATCH Score Latch: Grasps breast easily, tongue down, lips flanged, rhythmical sucking.  Audible Swallowing: A few with stimulation  Type of Nipple: Everted at rest and after stimulation  Comfort (Breast/Nipple): Soft / non-tender  Hold (Positioning): Assistance needed to correctly position infant at breast and maintain latch.  LATCH Score: 8   Lactation Tools Discussed/Used    Interventions Interventions: Position options;Support pillows;Adjust position;Breast feeding basics reviewed;Skin to skin;Assisted with latch;LC Services brochure;Education  Discharge Pump: DEBP;Personal  Consult Status Consult Status: Follow-up Date: 12/01/22 Follow-up type: In-patient    Frederico Hamman 12/01/2022, 12:10 AM

## 2022-12-01 NOTE — Progress Notes (Signed)
POSTPARTUM PROGRESS NOTE  POD #1  Subjective:  [name] Collier is a 27 y.o. G1P1001 s/p pLTCS at [redacted]w[redacted]d.  She reports she doing well. No acute events overnight. She reports she is doing well. She denies any problems with voiding or po intake. She has gotten up to the bathroom, but hasn't done much other walking besides that. Denies nausea or vomiting. She has passed flatus. Pain is well controlled.  Lochia is minimal.  Objective: Blood pressure 119/65, pulse 84, temperature 98.2 F (36.8 C), temperature source Oral, resp. rate 18, height 5\' 6"  (1.676 m), weight 108 kg, last menstrual period 03/05/2022, SpO2 99 %, unknown if currently breastfeeding.  Physical Exam:  General: alert, cooperative and no distress Chest: no respiratory distress Heart:regular rate, distal pulses intact Abdomen: soft, nontender,  Uterine Fundus: firm, appropriately tender DVT Evaluation: No calf swelling or tenderness Extremities: no edema Skin: warm, dry; incision clean/dry/intact w/ honeycomb dressing in place  Recent Labs    11/30/22 2010 12/01/22 0526  HGB 10.8* 9.7*  HCT 33.5* 30.2*    Assessment/Plan: Josselin C Milstein is a 27 y.o. G1P1001 s/p pLTCS at [redacted]w[redacted]d for NRFHT.  POD#1 - Doing welll; pain is well controlled. H/H appropriate  Routine postpartum care  OOB, ambulated  Lovenox for VTE prophylaxis Mild Anemia: asymptomatic  Start po ferrous sulfate every other day gHTN: postpartum blood pressures normal. Triple I: plan for 24hrs of amp/gent postpartum Start lasix 20mg  daily Contraception: declines Feeding: breast  Dispo: Plan for discharge in 1-2 days.   LOS: 2 days   Sheppard Evens MD MPH OB Fellow, Faculty [facility], Center for [facility] 12/01/2022

## 2022-12-02 ENCOUNTER — Ambulatory Visit: Payer: Medicaid Other | Admitting: Cardiology

## 2022-12-02 ENCOUNTER — Encounter (HOSPITAL_COMMUNITY): Payer: Self-pay | Admitting: Obstetrics and Gynecology

## 2022-12-02 ENCOUNTER — Ambulatory Visit: Payer: Medicaid Other

## 2022-12-02 NOTE — Progress Notes (Addendum)
Subjective: Postpartum Day 2: Cesarean Delivery [name] Collier  is a 27 y.o. G1P1001 s/p C/S at [redacted]w[redacted]d.  She reports she is doing well. No acute events overnight. She denies any problems with ambulating, voiding or po intake. Denies nausea or vomiting.  Denies passing flatus or BM at this time. Pain is minimally controlled on ibuprofen and oxycodone.  Lochia is moderate and improving.  Objective: Vital signs in last 24 hours: Temp:  [98.6 F (37 C)] 98.6 F (37 C) (05/05 1200) Pulse Rate:  [72-80] 72 (05/06 0531) Resp:  [17-18] 17 (05/06 0531) BP: (119-131)/(62-65) 121/65 (05/06 0531) SpO2:  [100 %] 100 % (05/06 0531)  Physical Exam:  General: alert, cooperative, fatigued, and mild distress Lochia: appropriate Uterine Fundus: firm, U-2 Incision: healing well, no significant drainage, no dehiscence, no significant erythema DVT Evaluation: No evidence of DVT seen on physical exam. Negative Homan's sign. No cords or calf tenderness. No significant calf/ankle edema.  Recent Labs    11/30/22 2010 12/01/22 0526  HGB 10.8* 9.7*  HCT 33.5* 30.2*    Assessment/Plan: Status post Cesarean section. Doing well postoperatively.  Continue current care.  Raelyn Mora, CNM 12/02/2022, 11:11 AM

## 2022-12-03 ENCOUNTER — Encounter: Payer: BC Managed Care – PPO | Admitting: Obstetrics & Gynecology

## 2022-12-03 ENCOUNTER — Other Ambulatory Visit (HOSPITAL_COMMUNITY): Payer: Self-pay

## 2022-12-03 LAB — SURGICAL PATHOLOGY

## 2022-12-03 MED ORDER — FUROSEMIDE 20 MG PO TABS
20.0000 mg | ORAL_TABLET | Freq: Every day | ORAL | 0 refills | Status: DC
  Filled 2022-12-03: qty 3, 3d supply, fill #0

## 2022-12-03 MED ORDER — POLYETHYLENE GLYCOL 3350 17 GM/SCOOP PO POWD
17.0000 g | Freq: Every day | ORAL | 0 refills | Status: AC | PRN
  Filled 2022-12-03: qty 238, 14d supply, fill #0

## 2022-12-03 MED ORDER — IBUPROFEN 600 MG PO TABS
600.0000 mg | ORAL_TABLET | Freq: Four times a day (QID) | ORAL | 0 refills | Status: AC | PRN
  Filled 2022-12-03: qty 30, 8d supply, fill #0

## 2022-12-03 MED ORDER — OXYCODONE-ACETAMINOPHEN 5-325 MG PO TABS
1.0000 | ORAL_TABLET | Freq: Four times a day (QID) | ORAL | 0 refills | Status: DC | PRN
  Filled 2022-12-03: qty 30, 5d supply, fill #0

## 2022-12-03 MED ORDER — FERROUS SULFATE 325 (65 FE) MG PO TABS
325.0000 mg | ORAL_TABLET | ORAL | 0 refills | Status: AC
  Filled 2022-12-03: qty 30, 60d supply, fill #0

## 2022-12-03 MED ORDER — SIMETHICONE 80 MG PO CHEW
80.0000 mg | CHEWABLE_TABLET | ORAL | 0 refills | Status: DC | PRN
  Filled 2022-12-03: qty 28, 28d supply, fill #0

## 2022-12-03 MED ORDER — GABAPENTIN 100 MG PO CAPS
200.0000 mg | ORAL_CAPSULE | Freq: Two times a day (BID) | ORAL | 0 refills | Status: DC
  Filled 2022-12-03: qty 28, 7d supply, fill #0

## 2022-12-03 NOTE — Lactation Note (Signed)
This note was copied from a baby's chart. Lactation Consultation Note  Patient Name: [name] Collier Today's Date: 12/03/2022 Age:25 hours Reason for consult: Follow-up assessment;1st time breastfeeding;Early term 37-38.6wks;Infant weight loss;Breastfeeding assistance;Nipple pain/trauma (8 % weight loss,) Serum Bilirubin @ 0635 = 14.2 . Per MBURB repeat ordered for 3:30 p.  As LC entered the room, mom had pumped off 18 ml of EBM and LC praised mom. Milk is in.  Per mom both nipples have some tenderness. LC offered to assess and noted positional strips on the upper edges of both nipples. Mom receptive.  LC reviewed hand expressing and encouraged mom to use her EBM on her nipples liberally and a dab of coconut oil with pumping.  LC recommended due to the high bilirubin, sore nipples, and 8 % weight loss steps for latching and feed the 1st breast and supplement pace feeding afterwards up to 30 ml , if not available formula.  Post pump both breast for 15 mins and save for the next feeding.  LC was able to assist with latch and worked on depth, baby still feeding.  Per  mom more comfortable.    Maternal Data Has patient been taught Hand Expression?: Yes (expressed several drops)  Feeding Mother's Current Feeding Choice: Breast Milk and Formula Nipple Type: Slow - flow  LATCH Score Latch: Grasps breast easily, tongue down, lips flanged, rhythmical sucking.  Audible Swallowing: Spontaneous and intermittent  Type of Nipple: Everted at rest and after stimulation (positional strips)  Comfort (Breast/Nipple): Filling, red/Reetz blisters or bruises, mild/mod discomfort  Hold (Positioning): Assistance needed to correctly position infant at breast and maintain latch.  LATCH Score: 8   Lactation Tools Discussed/Used Tools: Pump;Flanges;Coconut oil Flange Size: 21 Breast pump type: Manual;Double-Electric Breast Pump Pump Education: Milk Storage;Setup, frequency, and cleaning;Other  (comment) (LC Reviewed)  Interventions Interventions: Breast feeding basics reviewed;Assisted with latch;Skin to skin;Breast massage;Hand express;Reverse pressure;Breast compression;Adjust position;Support pillows;Position options;Coconut oil;Hand pump;DEBP;Education;Pace feeding;LC Services brochure  Discharge Pump: Personal;DEBP;Manual  Consult Status Consult Status: Follow-up Date: 12/03/22 Follow-up type: In-patient    [name] Collier [name] 12/03/2022, 11:43 AM

## 2022-12-03 NOTE — Discharge Instructions (Addendum)
Continue to check your blood pressures twice a day Call the office for blood pressures that are consistently above 140 for the top number or 90 for the bottom number   Hypertension During Pregnancy Hypertension is also called high blood pressure. High blood pressure means that the force of your blood moving in your body is too strong. It can cause problems for you and your baby. Different types of high blood pressure can happen during pregnancy. The types are: High blood pressure before you got pregnant. This is called chronic hypertension.  This can continue during your pregnancy. Your doctor will want to keep checking your blood pressure. You may need medicine to keep your blood pressure under control while you are pregnant. You will need follow-up visits after you have your baby. High blood pressure that goes up during pregnancy when it was normal before. This is called gestational hypertension. It will usually get better after you have your baby, but your doctor will need to watch your blood pressure to make sure that it is getting better. Very high blood pressure during pregnancy. This is called preeclampsia. Very high blood pressure is an emergency that needs to be checked and treated right away. You may develop very high blood pressure after giving birth. This is called postpartum preeclampsia. This usually occurs within 48 hours after childbirth but may occur up to 6 weeks after giving birth. This is rare. How does this affect me? If you have high blood pressure during pregnancy, you have a higher chance of developing high blood pressure: As you get older. If you get pregnant again. In some cases, high blood pressure during pregnancy can cause: Stroke. Heart attack. Damage to the kidneys, lungs, or liver. Preeclampsia. Jerky movements you cannot control (convulsions or seizures). Problems with the placenta.  What can I do to lower my risk?  Keep a healthy weight. Eat a healthy  diet. Follow what your doctor tells you about treating any medical problems that you had before becoming pregnant. It is very important to go to all of your doctor visits. Your doctor will check your blood pressure and make sure that your pregnancy is progressing as it should. Treatment should start early if a problem is found.  Follow these instructions at home:  Take your blood pressure 1-2 times per day. Call the office if your blood pressure is 155 or higher for the top number or 105 or higher for the bottom number.    Eating and drinking  Drink enough fluid to keep your pee (urine) pale yellow. Avoid caffeine. Lifestyle Do not use any products that contain nicotine or tobacco, such as cigarettes, e-cigarettes, and chewing tobacco. If you need help quitting, ask your doctor. Do not use alcohol or drugs. Avoid stress. Rest and get plenty of sleep. Regular exercise can help. Ask your doctor what kinds of exercise are best for you. General instructions Take over-the-counter and prescription medicines only as told by your doctor. Keep all prenatal and follow-up visits as told by your doctor. This is important. Contact a doctor if: You have symptoms that your doctor told you to watch for, such as: Headaches. Nausea. Vomiting. Belly (abdominal) pain. Dizziness. Light-headedness. Get help right away if: You have: Very bad belly pain that does not get better with treatment. A very bad headache that does not get better. Vomiting that does not get better. Sudden, fast weight gain. Sudden swelling in your hands, ankles, or face. Blood in your pee. Blurry vision. Double vision.   Shortness of breath. Chest pain. Weakness on one side of your body. Trouble talking. Summary High blood pressure is also called hypertension. High blood pressure means that the force of your blood moving in your body is too strong. High blood pressure can cause problems for you and your baby. Keep all  follow-up visits as told by your doctor. This is important. This information is not intended to replace advice given to you by your health care provider. Make sure you discuss any questions you have with your health care provider. Document Released: 08/17/2010 Document Revised: 11/05/2018 Document Reviewed: 08/11/2018 Elsevier Patient Education  2020 Elsevier Inc. 

## 2022-12-03 NOTE — Lactation Note (Signed)
This note was copied from a baby's chart. Lactation Consultation Note  Patient Name: [name] Collier Today's Date: 12/03/2022 Age:25 hours Reason for consult: 1st time breastfeeding;Follow-up assessment;Early term 37-38.6wks;Infant weight loss (-8.08% weight loss).  Birth Parent is breast feeding and supplementing infant with formula. LC entered room, infant with pacifier in mouth, LC discussed with Birth Parent to delay pacifier until 3 weeks postpartum. Infant was cuing to feed. Birth Parent formula feed infant with this feeding 30 mls using slow flow bottle nipple. Per Birth Parent when using DEBP she is now seeing 3 mls of colostrum when pumping. Birth Parent been pouring formula in bottle and putting RTF formula in fridge and saving bottle nipple. LC discussed to discard yellow bottle nipples they only good for one feeding and not put RTF formula in fridge. Policy is once open formula only be used within 1 hour.   Birth Parent current feeding plan: 1- Will continue to BF infant by cues, on demand, 8+ times within 24 hours, STS and limit chest feeding 20 minutes and then supplement infant with any EBM first and then formula. 2- With latch to supplement infant  with 25 mls of EBM/Formula  or more if want it, but if infant doesn't latch to offer 30 mls or more per feeding. 3- Birth Parent will continue to use DEBP every 3 hours for 15 minutes on initial setting.  4- Birth Parent will continue to supplement infant with her EBM/ formula until infant regains birth weight.  Maternal Data    Feeding Mother's Current Feeding Choice: Breast Milk and Formula  LATCH Score                    Lactation Tools Discussed/Used Tools: Pump Breast pump type: Double-Electric Breast Pump Pump Education: Milk Storage;Setup, frequency, and cleaning Pumped volume: 3 mL (Per Birth Parent, expressing 3 mls when pumping.)  Interventions Interventions: Pace  feeding;Education;DEBP  Discharge Pump: Personal;DEBP  Consult Status Consult Status: Follow-up Date: 12/03/22 Follow-up type: In-patient    Frederico Hamman 12/03/2022, 1:00 AM

## 2022-12-04 ENCOUNTER — Encounter: Payer: Self-pay | Admitting: Obstetrics and Gynecology

## 2022-12-09 ENCOUNTER — Ambulatory Visit (INDEPENDENT_AMBULATORY_CARE_PROVIDER_SITE_OTHER): Payer: Medicaid Other | Admitting: *Deleted

## 2022-12-09 VITALS — BP 123/81 | HR 76 | Wt 220.0 lb

## 2022-12-09 DIAGNOSIS — Z98891 History of uterine scar from previous surgery: Secondary | ICD-10-CM | POA: Diagnosis not present

## 2022-12-09 DIAGNOSIS — R300 Dysuria: Secondary | ICD-10-CM | POA: Diagnosis not present

## 2022-12-09 NOTE — Progress Notes (Signed)
Subjective:  [name] Collier is a 27 y.o. female here for BP check and incision check.  Hypertension ROS: no chest pain on exertion, no dyspnea on exertion, and no swelling of ankles.   Pt reports incision covered. Pt also report dysuria.   Objective:  LMP 03/05/2022   Appearance alert, well appearing, and in no distress. Incision healing well   Assessment:   Blood Pressure today in office well controlled.  Incision healed nicely, no signs of infection, scar looks good  Plan:  Send urine for culture. Follow up as needed and or at postpartum visit.  Scheryl Marten, RN

## 2022-12-10 ENCOUNTER — Encounter: Payer: BC Managed Care – PPO | Admitting: Family Medicine

## 2022-12-13 LAB — URINE CULTURE

## 2022-12-17 ENCOUNTER — Encounter: Payer: BC Managed Care – PPO | Admitting: Obstetrics and Gynecology

## 2022-12-24 ENCOUNTER — Ambulatory Visit (INDEPENDENT_AMBULATORY_CARE_PROVIDER_SITE_OTHER): Payer: Medicaid Other | Admitting: *Deleted

## 2022-12-24 ENCOUNTER — Encounter: Payer: BC Managed Care – PPO | Admitting: Obstetrics & Gynecology

## 2022-12-24 ENCOUNTER — Encounter: Payer: Self-pay | Admitting: Obstetrics and Gynecology

## 2022-12-24 VITALS — BP 127/76 | HR 73

## 2022-12-24 DIAGNOSIS — Z98891 History of uterine scar from previous surgery: Secondary | ICD-10-CM

## 2022-12-24 NOTE — Progress Notes (Signed)
Subjective:     [name] Collier is a 27 y.o. female who presents to the clinic 4 weeks status post low uterine, transverse cesarean section. Pt reports incision is oozing.      Objective:    BP 127/76   Pulse 73   LMP 03/05/2022   Breastfeeding Yes  General:  alert, well appearing, in no apparent distress  Incision:   Did have steri strips on, healing well, no drainage, no erythema, no hernia, no seroma, no swelling, no dehiscence, incision well approximated     Assessment:    Doing well postoperatively. Removed Steri Strips    Plan:    1. Continue any current medications. 2. Wound care discussed. 3. Follow up: at postpartum visit .   Scheryl Marten, RN

## 2023-01-08 ENCOUNTER — Ambulatory Visit (INDEPENDENT_AMBULATORY_CARE_PROVIDER_SITE_OTHER): Payer: Medicaid Other | Admitting: Obstetrics and Gynecology

## 2023-01-08 ENCOUNTER — Other Ambulatory Visit (HOSPITAL_COMMUNITY)
Admission: RE | Admit: 2023-01-08 | Discharge: 2023-01-08 | Disposition: A | Discharge status: Home / Self Care | Payer: Medicaid Other | Source: Ambulatory Visit | Attending: Obstetrics and Gynecology | Admitting: Obstetrics and Gynecology

## 2023-01-08 ENCOUNTER — Encounter: Payer: Self-pay | Admitting: Obstetrics and Gynecology

## 2023-01-08 NOTE — Progress Notes (Signed)
    Post Partum Visit Note  [name] Collier is a 27 y.o. G1P1001 s/p 5/4 PLTCS at 37wks for failed GHTN IOL.  Anesthesia: epidural. Postpartum course has been well. Baby is doing well. Baby is feeding by breast. Bleeding  : occasional discharge and bleeding . Bowel function is normal. Bladder function is normal. Patient is not sexually active. Contraception method is abstinence. Postpartum depression screening: negative.    Edinburgh Postnatal Depression Scale - 01/08/23 1526       Edinburgh Postnatal Depression Scale:  In the Past 7 Days   I have been able to laugh and see the funny side of things. 0    I have looked forward with enjoyment to things. 0    I have blamed myself unnecessarily when things went wrong. 0    I have been anxious or worried for no good reason. 0    I have felt scared or panicky for no good reason. 0    I have been so unhappy that I have had difficulty sleeping. 0    I have felt sad or miserable. 0    I have been so unhappy that I have been crying. 1    The thought of harming myself has occurred to me. 0             Review of Systems Pertinent items noted in HPI and remainder of comprehensive ROS otherwise negative.  Objective:  BP 110/66   Wt 215 lb (97.5 kg)   LMP 03/05/2022   Breastfeeding Yes   BMI 34.70 kg/m    General: NAD Abdomen: benign. C/d/I incision Pelvic exam performed with chaperone EGBUS wnl Vagina wnl Cervix wnl, scant tan yellow d/c at os Bimanual negative  Assessment:     Normal postpartum exam.   Plan:   Routine care. F/u pap smear. Patient declines contraception. Normal lochia and I told her it should stop in the next few weeks; pt is exclusively breastfeeding. Recommend another 3 weeks of abdominal restrictions   Hanover Bing, MD Center for [employer], [facility]

## 2023-01-22 LAB — CYTOLOGY - PAP

## 2023-01-23 ENCOUNTER — Encounter: Payer: Self-pay | Admitting: Obstetrics and Gynecology

## 2023-01-23 DIAGNOSIS — R87612 Low grade squamous intraepithelial lesion on cytologic smear of cervix (LGSIL): Secondary | ICD-10-CM | POA: Insufficient documentation

## 2023-02-07 ENCOUNTER — Encounter: Payer: Self-pay | Admitting: Obstetrics and Gynecology

## 2023-02-14 ENCOUNTER — Ambulatory Visit (INDEPENDENT_AMBULATORY_CARE_PROVIDER_SITE_OTHER): Payer: Medicaid Other

## 2023-02-14 VITALS — BP 108/68 | HR 72

## 2023-02-14 DIAGNOSIS — Z23 Encounter for immunization: Secondary | ICD-10-CM

## 2023-02-14 NOTE — Progress Notes (Signed)
[name] Collier is here for their 1st Gardasil injection. Pt denies any issues at this time. Pt tolerated injection well. To follow up in 2 months for next injection.

## 2023-03-13 ENCOUNTER — Ambulatory Visit: Payer: Medicaid Other | Admitting: Allergy & Immunology

## 2023-04-03 DIAGNOSIS — M25532 Pain in left wrist: Secondary | ICD-10-CM | POA: Diagnosis not present

## 2023-04-03 DIAGNOSIS — M654 Radial styloid tenosynovitis [de Quervain]: Secondary | ICD-10-CM | POA: Diagnosis not present

## 2023-04-17 ENCOUNTER — Ambulatory Visit: Payer: Medicaid Other

## 2023-04-17 VITALS — Wt 213.0 lb

## 2023-04-17 DIAGNOSIS — Z23 Encounter for immunization: Secondary | ICD-10-CM | POA: Diagnosis not present

## 2023-04-17 NOTE — Progress Notes (Signed)
[name] Collier is here for their 2nd Gardasil injection. Pt denies any issues at this time. Pt tolerated injection well. To follow up in 4 months for next injection.

## 2023-04-30 ENCOUNTER — Other Ambulatory Visit: Payer: Self-pay

## 2023-04-30 ENCOUNTER — Encounter: Payer: Self-pay | Admitting: Internal Medicine

## 2023-04-30 ENCOUNTER — Ambulatory Visit (INDEPENDENT_AMBULATORY_CARE_PROVIDER_SITE_OTHER): Payer: Medicaid Other | Admitting: Internal Medicine

## 2023-04-30 VITALS — BP 112/70 | HR 66 | Temp 97.9°F | Ht 66.54 in | Wt 213.0 lb

## 2023-04-30 DIAGNOSIS — T781XXA Other adverse food reactions, not elsewhere classified, initial encounter: Secondary | ICD-10-CM

## 2023-04-30 DIAGNOSIS — J4520 Mild intermittent asthma, uncomplicated: Secondary | ICD-10-CM

## 2023-04-30 DIAGNOSIS — J301 Allergic rhinitis due to pollen: Secondary | ICD-10-CM | POA: Diagnosis not present

## 2023-04-30 DIAGNOSIS — J3089 Other allergic rhinitis: Secondary | ICD-10-CM | POA: Diagnosis not present

## 2023-04-30 DIAGNOSIS — T7805XA Anaphylactic reaction due to tree nuts and seeds, initial encounter: Secondary | ICD-10-CM | POA: Diagnosis not present

## 2023-04-30 DIAGNOSIS — T7819XA Other adverse food reactions, not elsewhere classified, initial encounter: Secondary | ICD-10-CM

## 2023-04-30 DIAGNOSIS — J3081 Allergic rhinitis due to animal (cat) (dog) hair and dander: Secondary | ICD-10-CM | POA: Diagnosis not present

## 2023-04-30 MED ORDER — CETIRIZINE HCL 10 MG PO TABS: 10.0000 mg | ORAL_TABLET | Freq: Every day | ORAL | 5 refills | Status: DC | PRN

## 2023-04-30 MED ORDER — ALBUTEROL SULFATE HFA 108 (90 BASE) MCG/ACT IN AERS: 2.0000 | INHALATION_SPRAY | Freq: Four times a day (QID) | RESPIRATORY_TRACT | 1 refills | Status: AC | PRN

## 2023-04-30 MED ORDER — EPINEPHRINE 0.3 MG/0.3ML IJ SOAJ: 0.3000 mg | INTRAMUSCULAR | 1 refills | Status: AC | PRN

## 2023-04-30 MED ORDER — AZELASTINE HCL 0.1 % NA SOLN: 1.0000 | Freq: Two times a day (BID) | NASAL | 5 refills | Status: AC | PRN

## 2023-04-30 NOTE — Patient Instructions (Addendum)
Mild Intermittent Asthma: - Normal spirometry today.  - Maintenance inhaler: none - Rescue inhaler: Albuterol 2 puffs or 1 vial via nebulizer every 4-6 hours as needed for respiratory symptoms of cough, shortness of breath, or wheezing Asthma control goals:  Full participation in all desired activities (may need albuterol before activity) Albuterol use two times or less a week on average (not counting use with activity) Cough interfering with sleep two times or less a month Oral steroids no more than once a year No hospitalizations   Allergic Rhinitis: - Positive skin test 04/2023: trees, grasses, weeds,molds, dust mites, cats, cockroach  - Avoidance measures discussed. - Use nasal saline rinses before nose sprays such as with Neilmed Sinus Rinse.  Use distilled water.   - Use Azelastine 1-2 sprays each nostril twice daily as needed for runny nose, drainage, sneezing, congestion. Aim upward and outward. - Use Zyrtec 10 mg daily as needed for runny nose, sneezing, itchy watery eyes.  - Consider allergy shots as long term control of your symptoms by teaching your immune system to be more tolerant of your allergy triggers.   Food allergy:  - please strictly avoid treenuts for now. Okay to eat peanuts.  - SPT 04/2023: positive for cashew, almond, hazlenut, pistachio, [place] nut  - will obtain sIgE to treenuts to see if these are low risk components similar to pollen food allergy syndrome.  - for SKIN only reaction, okay to take Benadryl 25mg  capsules every 6 hours as needed - for SKIN + ANY additional symptoms, OR IF concern for LIFE THREATENING reaction = Epipen Autoinjector EpiPen 0.3 mg. - If using Epinephrine autoinjector, call 911 or go to the ER.    Oral Allergy Syndrome-Fruits - SPT 04/2023: positive to pineapples, cherry, banana; negative to peach. - These symptoms are typically not life-threatening and are because of a cross reaction between a pollen you are allergic to, and to a  protein in specific foods (such as fresh fruits, vegetables, and nuts). - If you can eat these things and tolerate the symptoms, it is fine to continue to do so.  If not, you may avoid these fresh fruits and vegetables.   - Heating these foods, buying them canned, and peeling these foods should allow them to be consumed without symptoms or with less symptoms.   ALLERGEN AVOIDANCE MEASURES   Dust Mites Use central air conditioning and heat; and change the filter monthly.  Pleated filters work better than mesh filters.  Electrostatic filters may also be used; wash the filter monthly.  Window air conditioners may be used, but do not clean the air as well as a central air conditioner.  Change or wash the filter monthly. Keep windows closed.  Do not use attic fans.   Encase the mattress, box springs and pillows with zippered, dust proof covers. Wash the bed linens in hot water weekly.   Remove carpet, especially from the bedroom. Remove stuffed animals, throw pillows, dust ruffles, heavy drapes and other items that collect dust from the bedroom. Do not use a humidifier.   Use wood, vinyl or leather furniture instead of cloth furniture in the bedroom. Keep the indoor humidity at 30 - 40%.  Monitor with a humidity gauge.  Molds - Indoor avoidance Use air conditioning to reduce indoor humidity.  Do not use a humidifier. Keep indoor humidity at 30 - 40%.  Use a dehumidifier if needed. In the bathroom use an exhaust fan or open a window after showering.  Wipe down  damp surfaces after showering.  Clean bathrooms with a mold-killing solution (diluted bleach, or products like Tilex, etc) at least once a month. In the kitchen use an exhaust fan to remove steam from cooking.  Throw away spoiled foods immediately, and empty garbage daily.  Empty water pans below self-defrosting refrigerators frequently. Vent the clothes dryer to the outside. Limit indoor houseplants; mold grows in the dirt.  No houseplants  in the bedroom. Remove carpet from the bedroom. Encase the mattress and box springs with a zippered encasing.  Molds - Outdoor avoidance Avoid being outside when the grass is being mowed, or the ground is tilled. Avoid playing in leaves, pine straw, hay, etc.  Dead plant materials contain mold. Avoid going into barns or grain storage areas. Remove leaves, clippings and compost from around the home.  Cockroach Limit spread of food around the house; especially keep food out of bedrooms. Keep food and garbage in closed containers with a tight lid.  Never leave food out in the kitchen.  Do not leave out pet food or dirty food bowls. Mop the kitchen floor and wash countertops at least once a week. Repair leaky pipes and faucets so there is no standing water to attract roaches. Plug up cracks in the house through which cockroaches can enter. Use bait stations and approved pesticides to reduce cockroach infestation. Pollen Avoidance Pollen levels are highest during the mid-day and afternoon.  Consider this when planning outdoor activities. Avoid being outside when the grass is being mowed, or wear a mask if the pollen-allergic person must be the one to mow the grass. Keep the windows closed to keep pollen outside of the home. Use an air conditioner to filter the air. Take a shower, wash hair, and change clothing after working or playing outdoors during pollen season. Pet Dander Keep the pet out of your bedroom and restrict it to only a few rooms. Be advised that keeping the pet in only one room will not limit the allergens to that room. Don't pet, hug or kiss the pet; if you do, wash your hands with soap and water. High-efficiency particulate air (HEPA) cleaners run continuously in a bedroom or living room can reduce allergen levels over time. Regular use of a high-efficiency vacuum cleaner or a central vacuum can reduce allergen levels. Giving your pet a bath at least once a week can reduce  airborne allergen.

## 2023-04-30 NOTE — Progress Notes (Signed)
NEW PATIENT  Date of Service/Encounter:  04/30/23  Consult requested by: Patient, No Pcp Per   Subjective:   [name] Collier (DOB: 09/16/1995) is a 27 y.o. female who presents to the clinic on 04/30/2023 with a chief complaint of Allergies (Pineapple and tree nuts), Establish Care, and Asthma .    History obtained from: chart review and patient.   Discussed the use of AI scribe software for clinical note transcription with the patient, who gave verbal consent to proceed.  History of Present Illness   The patient, with a history of asthma since childhood, presents with concerns of both sinus and food allergies. She reports seasonal sinus allergies, particularly during winter season changes, with symptoms of congestion, runny nose, sneezing, and eye itching. These symptoms began in middle school and were initially triggered by grass exposure, causing significant itching. The patient manages these symptoms with over-the-counter Zyrtec, last taken a year ago, and has never undergone allergy testing or sinus surgeries.  Regarding food allergies, the patient avoids bananas, pineapples, peaches, tree nuts, and whole pit cherries due to adverse reactions. Fruits result in oral itching and bumpy rash on face.  However, treenuts cause mouth itching, rash on face and stomach cramping. She can tolerate the fruits canned or cooked without any issues. Does fine with peanuts but doesn't like eating a lot of it.  She has never had a severe allergic reaction requiring emergency care or an EpiPen, and has not undergone food allergy testing.  The patient's asthma, diagnosed in infancy following bronchitis, was severe enough to necessitate hospital visits and the use of an asthma pump during childhood. However, the frequency and severity of asthma attacks have decreased with age, with seasonal changes being the primary trigger. The patient has not required emergency care, oral prednisone, or primary care visits  for breathing issues in the past year. She continues to use albuterol for flare-ups which is rarely.        Past Medical History: Past Medical History:  Diagnosis Date   Anemia    Asthma    Blood in stool    Dysfunctional uterine bleeding 07/01/2013   Gestational hypertension w/o significant proteinuria in 3rd trimester 10/11/2022   dx 3/15 @ 30/0     Baseline and surveillance labs (pulled in from [facility], refresh links as needed)       Lab Results  Component  Value  Date     PLT  162  10/11/2022     CREATININE  0.68  10/11/2022     AST  19  10/11/2022     ALT  15  10/11/2022     PROTCRRATIO  0.12  10/11/2022        Antenatal Testing  Pre-eclampsia   GHTN or Preeclampsia without severe features      Preeclampsia with severe feat   Gonorrhea affecting pregnancy in first trimester 08/01/2022   [X]  toc: neg   Headache    PCOS (polycystic ovarian syndrome)    Vaginal Pap smear, abnormal     Past Surgical History: Past Surgical History:  Procedure Laterality Date   CESAREAN SECTION N/A 11/30/2022   Procedure: CESAREAN SECTION;  Surgeon: Eagle Bing, MD;  Location: [facility] LD ORS;  Service: Obstetrics;  Laterality: N/A;   TONSILLECTOMY     TYMPANOSTOMY TUBE PLACEMENT      Family History: Family History  Problem Relation Age of Onset   Epilepsy Mother    Healthy Father     Social History:  Flooring in bedroom: carpet Pets: dog Tobacco use/exposure: none  Job: answering specialist   Medication List:  Allergies as of 04/30/2023       Reactions   Black Walnut Flavor Anaphylaxis   Cherry Anaphylaxis, Rash   Tongue tingling   Peach [prunus Persica] Anaphylaxis, Rash   Tongue tingling   Peanut-containing Drug Products Anaphylaxis   Almond (diagnostic) Other (See Comments)   Tongue tingling   Aspirin Other (See Comments)   Nosebleeds   Estonia Nut (berthollefia [place])    Cashew Nut (anacardium Occidentale) Skin Test    Macadamia Nut Oil    Pistachio Nut (diagnostic)          Medication List        Accurate as of April 30, 2023 10:22 AM. If you have any questions, ask your nurse or doctor.          albuterol (2.5 MG/3ML) 0.083% nebulizer solution Commonly known as: PROVENTIL Take 3 mLs (2.5 mg total) by nebulization every 6 (six) hours as needed for wheezing or shortness of breath.   cholecalciferol 25 MCG (1000 UNIT) tablet Commonly known as: VITAMIN D3 Take 1,000 Units by mouth daily.   FeroSul 325 (65 Fe) MG tablet Generic drug: ferrous sulfate Take 1 tablet (325 mg total) by mouth every other day for 30 doses.   furosemide 20 MG tablet Commonly known as: LASIX Take 1 tablet (20 mg total) by mouth daily for 3 doses.   gabapentin 100 MG capsule Commonly known as: NEURONTIN Take 2 capsules (200 mg total) by mouth 2 (two) times daily for 7 days.   ibuprofen 600 MG tablet Commonly known as: ADVIL Take 1 tablet (600 mg total) by mouth every 6 (six) hours as needed.   oxyCODONE-acetaminophen 5-325 MG tablet Commonly known as: PERCOCET/ROXICET Take 1-2 tablets by mouth every 6 (six) hours as needed.   polyethylene glycol powder 17 GM/SCOOP powder Commonly known as: GLYCOLAX/MIRALAX Take 17 g by mouth daily as needed.   Taron-C DHA 35-1 MG Caps Take 1 capsule by mouth daily.   V-R GAS RELIEF 80 MG chewable tablet Generic drug: simethicone Chew 1 tablet (80 mg total) by mouth as needed for flatulence.         REVIEW OF SYSTEMS: Pertinent positives and negatives discussed in HPI.   Objective:   Physical Exam: BP 112/70   Pulse 66   Temp 97.9 F (36.6 C) (Temporal)   Ht 5' 6.54" (1.69 m)   Wt 213 lb (96.6 kg)   SpO2 98%   BMI 33.83 kg/m  Body mass index is 33.83 kg/m. GEN: alert, well developed HEENT: clear conjunctiva,  TM grey and translucent, nose with + moderate inferior turbinate hypertrophy, pink nasal mucosa, lots of clear rhinorrhea, no cobblestoning HEART: regular rate and rhythm, no murmur LUNGS: clear to  auscultation bilaterally, no coughing, unlabored respiration ABDOMEN: soft, non distended  SKIN: no rashes or lesions  Reviewed:  10/31/2018: seen in ED for mild intermittent asthma exacerbation. Had sob/chest tightness, improved with albuterol.  CXR negative.  No wheezing on exam.   01/08/2023: saw Dr Vergie Living obgyn for postpartum care.  Doing well. Currently breastfeeding. S/p c section 5/3.   08/21/2022: seen by Dr Servando [facility] for orthostatic hypotension. Also with hx of asthma.  Discussed starting ASA for preeclampsia ppx.    Spirometry:  Tracings reviewed. Her effort: Good reproducible efforts. FVC: 3.75L FEV1: 3.02L, 100% predicted FEV1/FVC ratio: 81% Interpretation: Spirometry consistent with normal pattern.  Please see scanned  spirometry results for details.  Skin Testing:  Skin prick testing was placed, which includes aeroallergens/foods, histamine control, and saline control.  Verbal consent was obtained prior to placing test.  Patient tolerated procedure well.  Allergy testing results were read and interpreted by myself, documented by clinical staff. Adequate positive and negative control.  Results discussed with patient/family.  Airborne Adult Perc - 04/30/23 0925     Time Antigen Placed 8119    Allergen Manufacturer Waynette Buttery    Location Back    Number of Test 55    1. Control-Buffer 50% Glycerol Negative    2. Control-Histamine 3+    3. Bahia 3+    4. [place] 3+    5. Johnson 3+    6. Kentucky Blue 3+    7. Meadow Fescue 3+    8. Perennial Rye 3+    9. Timothy 3+    10. Ragweed Mix 2+    11. Cocklebur 2+    12. Plantain,  English 3+    13. Baccharis 3+    14. Dog Fennel Negative    15. Russian Thistle 3+    16. Lamb's Quarters 3+    17. Sheep Sorrell 3+    18. Rough Pigweed 3+    19. Marsh Elder, Rough 3+    20. Mugwort, Common 3+    21. Box, Elder 3+    22. Cedar, red 3+    23. Sweet Gum 3+    24. Pecan Pollen 3+    25. Pine Mix Negative    26. Walnut,  Black Pollen 3+    27. Red Mulberry 3+    28. Ash Mix 3+    29. Birch Mix 3+    30. Beech American 3+    31. Cottonwood, Eastern 3+    32. Hickory, White 3+    33. Maple Mix 3+    34. Oak, Guinea-[place] Mix 3+    35. Sycamore Eastern 3+    36. Alternaria Alternata 2+    37. Cladosporium Herbarum Negative    38. Aspergillus Mix 3+    39. Penicillium Mix Negative    40. Bipolaris Sorokiniana (Helminthosporium) 3+    41. Drechslera Spicifera (Curvularia) 3+    42. Mucor Plumbeus Negative    43. Fusarium Moniliforme 3+    44. Aureobasidium Pullulans (pullulara) Negative    45. Rhizopus Oryzae Negative    46. Botrytis Cinera Negative    47. Epicoccum Nigrum 3+    48. Phoma Betae 3+    49. Dust Mite Mix 2+    50. Cat Hair 10,000 BAU/ml 3+    51.  Dog Epithelia Negative    52. Mixed Feathers Negative    53. Horse Epithelia Negative    54. Cockroach, German 2+    55. Tobacco Leaf Negative             Food Adult Perc - 04/30/23 0900     Time Antigen Placed 1478    Allergen Manufacturer Waynette Buttery    Location Back    Number of allergen test 11    10. Cashew --   25x5   11. Walnut Food Negative    12. Almond --   9x5   13. Hazelnut --   6x15   14. Pecan Food Negative    15. Pistachio --   20x4   16. [place] Nut --   9x5   57. Banana --   10x5   59. Peach Negative  62. Cherry --   10x5   65. Pineapple --   9x6              Assessment:   1. Mild intermittent asthma without complication   2. Anaphylaxis due to tree nut, initial encounter   3. Pollen-food allergy, initial encounter   4. Seasonal allergic rhinitis due to pollen   5. Allergic rhinitis caused by mold   6. Allergic rhinitis due to animal hair or dander   7. Allergic rhinitis due to insect   8. Allergic rhinitis due to dust mite     Plan/Recommendations:  Mild Intermittent Asthma: - MDI technique discussed.  Normal spirometry today.  - Maintenance inhaler: none - Rescue inhaler: Albuterol 2 puffs or 1  vial via nebulizer every 4-6 hours as needed for respiratory symptoms of cough, shortness of breath, or wheezing Asthma control goals:  Full participation in all desired activities (may need albuterol before activity) Albuterol use two times or less a week on average (not counting use with activity) Cough interfering with sleep two times or less a month Oral steroids no more than once a year No hospitalizations   Allergic Rhinitis: - Due to turbinate hypertrophy, seasonal symptoms and unresponsive to over the counter meds, performed skin testing to identify aeroallergen triggers.   - Positive skin test 04/2023: trees, grasses, weeds,molds, dust mites, cats, cockroach  - Avoidance measures discussed. - Use nasal saline rinses before nose sprays such as with Neilmed Sinus Rinse.  Use distilled water.   - Use Azelastine 1-2 sprays each nostril twice daily as needed for runny nose, drainage, sneezing, congestion. Aim upward and outward. - Use Zyrtec 10 mg daily as needed for runny nose, sneezing, itchy watery eyes.  - Consider allergy shots as long term control of your symptoms by teaching your immune system to be more tolerant of your allergy triggers.   Food allergy:  - please strictly avoid treenuts for now. Okay to eat peanuts.  - Initial rxn: mouth itching, rash on face and stomach cramping with treenuts  - SPT 04/2023: positive for cashew, almond, hazlenut, pistachio, [place] nut  - will obtain sIgE to treenuts to see if these are low risk components similar to pollen food allergy syndrome.  - for SKIN only reaction, okay to take Benadryl 25mg  capsules every 6 hours as needed - for SKIN + ANY additional symptoms, OR IF concern for LIFE THREATENING reaction = Epipen Autoinjector EpiPen 0.3 mg. - If using Epinephrine autoinjector, call 911 or go to the ER.    Oral Allergy Syndrome-Fruits - SPT 04/2023: positive to pineapples, cherry, banana; negative to peach. - These symptoms are  typically not life-threatening and are because of a cross reaction between a pollen you are allergic to, and to a protein in specific foods (such as fresh fruits, vegetables, and nuts). - If you can eat these things and tolerate the symptoms, it is fine to continue to do so.  If not, you may avoid these fresh fruits and vegetables.   - Heating these foods, buying them canned, and peeling these foods should allow them to be consumed without symptoms or with less symptoms.     Return in about 2 months (around 06/30/2023).  Alesia Morin, MD [facility]

## 2023-05-02 NOTE — Progress Notes (Signed)
Patient was assessed and managed by nursing staff during this encounter. I have reviewed the chart and agree with the documentation and plan. I have also made any necessary editorial changes.  Carter Bing, MD 05/02/2023 4:25 AM

## 2023-05-04 LAB — IGE NUT PROF. W/COMPONENT RFLX
F017-IgE Hazelnut (Filbert): 12.5 kU/L — AB
F202-IgE Cashew Nut: 0.52 kU/L — AB
F202-IgE Cashew Nut: 2.11 kU/L — AB
F256-IgE Walnut: 1.07 kU/L — AB
Jug R 3 IgE: 0.85 kU/L — AB
Macadamia Nut, IgE: 1.1 kU/L — AB
Peanut, IgE: 2.22 kU/L — AB
Pecan Nut IgE: 0.3 kU/L — AB

## 2023-05-04 LAB — PANEL 604721
Jug R 1 IgE: <0.1 kU/L
Jug R 3 IgE: 0.19 kU/L — AB

## 2023-05-04 LAB — PEANUT COMPONENTS
F352-IgE Ara h 8: 4.4 kU/L — AB
F422-IgE Ara h 1: <0.1 kU/L
F423-IgE Ara h 2: <0.1 kU/L
F424-IgE Ara h 3: <0.1 kU/L
F427-IgE Ara h 9: 0.69 kU/L — AB
F447-IgE Ara h 6: <0.1 kU/L

## 2023-05-04 LAB — ALLERGEN COMPONENT COMMENTS

## 2023-05-04 LAB — PANEL 604726
Cor A 1 IgE: 19.9 kU/L — AB
Cor A 14 IgE: <0.1 kU/L
Cor A 8 IgE: <0.1 kU/L
Cor A 9 IgE: <0.1 kU/L

## 2023-05-04 LAB — PANEL 604239: ANA O 3 IgE: 0.25 kU/L — AB

## 2023-05-22 DIAGNOSIS — L732 Hidradenitis suppurativa: Secondary | ICD-10-CM | POA: Diagnosis not present

## 2023-05-22 DIAGNOSIS — L810 Postinflammatory hyperpigmentation: Secondary | ICD-10-CM | POA: Diagnosis not present

## 2023-05-22 DIAGNOSIS — L728 Other follicular cysts of the skin and subcutaneous tissue: Secondary | ICD-10-CM | POA: Diagnosis not present

## 2023-05-22 DIAGNOSIS — L02425 Furuncle of right lower limb: Secondary | ICD-10-CM | POA: Diagnosis not present

## 2023-05-22 DIAGNOSIS — L708 Other acne: Secondary | ICD-10-CM | POA: Diagnosis not present

## 2023-05-22 DIAGNOSIS — L83 Acanthosis nigricans: Secondary | ICD-10-CM | POA: Diagnosis not present

## 2023-06-13 DIAGNOSIS — L708 Other acne: Secondary | ICD-10-CM | POA: Diagnosis not present

## 2023-06-13 DIAGNOSIS — L810 Postinflammatory hyperpigmentation: Secondary | ICD-10-CM | POA: Diagnosis not present

## 2023-06-13 DIAGNOSIS — E559 Vitamin D deficiency, unspecified: Secondary | ICD-10-CM | POA: Diagnosis not present

## 2023-06-13 DIAGNOSIS — L728 Other follicular cysts of the skin and subcutaneous tissue: Secondary | ICD-10-CM | POA: Diagnosis not present

## 2023-06-13 DIAGNOSIS — L83 Acanthosis nigricans: Secondary | ICD-10-CM | POA: Diagnosis not present

## 2023-06-13 DIAGNOSIS — L700 Acne vulgaris: Secondary | ICD-10-CM | POA: Diagnosis not present

## 2023-06-13 DIAGNOSIS — L02425 Furuncle of right lower limb: Secondary | ICD-10-CM | POA: Diagnosis not present

## 2023-06-15 ENCOUNTER — Other Ambulatory Visit: Payer: Self-pay

## 2023-06-15 ENCOUNTER — Encounter (HOSPITAL_BASED_OUTPATIENT_CLINIC_OR_DEPARTMENT_OTHER): Payer: Self-pay

## 2023-06-15 DIAGNOSIS — Z91010 Allergy to peanuts: Secondary | ICD-10-CM | POA: Diagnosis not present

## 2023-06-15 DIAGNOSIS — R319 Hematuria, unspecified: Secondary | ICD-10-CM | POA: Diagnosis present

## 2023-06-15 DIAGNOSIS — N3001 Acute cystitis with hematuria: Secondary | ICD-10-CM | POA: Insufficient documentation

## 2023-06-15 LAB — URINALYSIS, ROUTINE W REFLEX MICROSCOPIC
Bilirubin Urine: NEGATIVE
Glucose, UA: NEGATIVE mg/dL
Ketones, ur: NEGATIVE mg/dL
Nitrite: NEGATIVE
Protein, ur: >300 mg/dL — AB
RBC / HPF: >50 RBC/hpf (ref 0–5)
Specific Gravity, Urine: 1.027 (ref 1.005–1.030)
WBC, UA: >50 WBC/hpf (ref 0–5)
pH: 7 (ref 5.0–8.0)

## 2023-06-15 LAB — PREGNANCY, URINE: Preg Test, Ur: NEGATIVE

## 2023-06-15 NOTE — ED Triage Notes (Signed)
Pt states she is having dysuria and hematuria +odor

## 2023-06-16 ENCOUNTER — Emergency Department (HOSPITAL_BASED_OUTPATIENT_CLINIC_OR_DEPARTMENT_OTHER)
Admission: EM | Admit: 2023-06-16 | Discharge: 2023-06-16 | Disposition: A | Discharge status: Home / Self Care | Payer: Medicaid Other | Attending: Emergency Medicine | Admitting: Emergency Medicine

## 2023-06-16 DIAGNOSIS — N3001 Acute cystitis with hematuria: Secondary | ICD-10-CM

## 2023-06-16 MED ORDER — PHENAZOPYRIDINE HCL 100 MG PO TABS
200.0000 mg | ORAL_TABLET | Freq: Once | ORAL | Status: AC
  Administered 2023-06-16: 200 mg via ORAL
  Filled 2023-06-16: qty 2

## 2023-06-16 MED ORDER — CEPHALEXIN 250 MG PO CAPS
500.0000 mg | ORAL_CAPSULE | Freq: Once | ORAL | Status: AC
  Administered 2023-06-16: 500 mg via ORAL
  Filled 2023-06-16: qty 2

## 2023-06-16 MED ORDER — CEPHALEXIN 500 MG PO CAPS: 500.0000 mg | ORAL_CAPSULE | Freq: Three times a day (TID) | ORAL | 0 refills | Status: DC

## 2023-06-16 MED ORDER — PHENAZOPYRIDINE HCL 200 MG PO TABS: 200.0000 mg | ORAL_TABLET | Freq: Three times a day (TID) | ORAL | 0 refills | Status: DC | PRN

## 2023-06-16 NOTE — ED Provider Notes (Signed)
[place] EMERGENCY DEPARTMENT AT [facility] Provider Note   CSN: [id number] Arrival date & time: 06/15/23  2214     History  Chief Complaint  Patient presents with   Hematuria    [name] Collier is a 27 y.o. female.  Patient is a 27 year old female presenting with complaints of dysuria worsening over the past 3 days.  This evening when she urinated, she noticed a Mccadden amount of blood and pink-colored urine.  No fevers or chills.  She denies any abdominal or back pain.  She denies history of prior UTI.  The history is provided by the patient.       Home Medications Prior to Admission medications   Medication Sig Start Date End Date Taking? Authorizing Provider  albuterol (PROVENTIL) (2.5 MG/3ML) 0.083% nebulizer solution Take 3 mLs (2.5 mg total) by nebulization every 6 (six) hours as needed for wheezing or shortness of breath. 10/31/18   Long, Arlyss Repress, MD  albuterol (VENTOLIN HFA) 108 (90 Base) MCG/ACT inhaler Inhale 2 puffs into the lungs every 6 (six) hours as needed. 04/30/23   Birder Robson, MD  azelastine (ASTELIN) 0.1 % nasal spray Place 1 spray into both nostrils 2 (two) times daily as needed. Use in each nostril as directed 04/30/23   Birder Robson, MD  cetirizine (ZYRTEC ALLERGY) 10 MG tablet Take 1 tablet (10 mg total) by mouth daily as needed for allergies. 04/30/23   Birder Robson, MD  cholecalciferol (VITAMIN D3) 25 MCG (1000 UNIT) tablet Take 1,000 Units by mouth daily. Patient not taking: Reported on 04/30/2023    [provider]  EPINEPHrine (EPIPEN 2-PAK) 0.3 mg/0.3 mL IJ SOAJ injection Inject 0.3 mg into the muscle as needed for anaphylaxis. 04/30/23   Birder Robson, MD  ferrous sulfate 325 (65 FE) MG tablet Take 1 tablet (325 mg total) by mouth every other day for 30 doses. 12/03/22 04/30/23  [place] Bing, MD  furosemide (LASIX) 20 MG tablet Take 1 tablet (20 mg total) by mouth daily for 3 doses. 12/03/22 12/06/22  [place] Bing, MD   gabapentin (NEURONTIN) 100 MG capsule Take 2 capsules (200 mg total) by mouth 2 (two) times daily for 7 days. 12/03/22 12/10/22  [place] Bing, MD  ibuprofen (ADVIL) 600 MG tablet Take 1 tablet (600 mg total) by mouth every 6 (six) hours as needed. Patient not taking: Reported on 04/30/2023 12/03/22   [place] Bing, MD  oxyCODONE-acetaminophen (PERCOCET/ROXICET) 5-325 MG tablet Take 1-2 tablets by mouth every 6 (six) hours as needed. Patient not taking: Reported on 04/30/2023 12/03/22   [place] Bing, MD  polyethylene glycol powder (GLYCOLAX/MIRALAX) 17 GM/SCOOP powder Take 17 g by mouth daily as needed. Patient not taking: Reported on 04/30/2023 12/03/22   [place] Bing, MD  Prenat-FeFum-FePo-FA-Omega 3 (TARON-C DHA) 35-1 MG CAPS Take 1 capsule by mouth daily. 07/08/22   [provider]  simethicone (MYLICON) 80 MG chewable tablet Chew 1 tablet (80 mg total) by mouth as needed for flatulence. Patient not taking: Reported on 04/30/2023 12/03/22   [place] Bing, MD      Allergies    Black walnut flavor, Valentino Saxon, Peach [prunus persica], Peanut-containing drug products, Almond (diagnostic), Aspirin, [place] nut (berthollefia excelsa), Cashew nut (anacardium occidentale) skin test, Macadamia nut oil, and Pistachio nut (diagnostic)    Review of Systems   Review of Systems  All other systems reviewed and are negative.   Physical Exam Updated Vital Signs BP 120/75   Pulse 71   Temp  98.3 F (36.8 C)   Resp 15   Ht 5\' 6"  (1.676 m)   Wt 96.2 kg   LMP 06/01/2023 (Exact Date)   SpO2 99%   BMI 34.22 kg/m  Physical Exam Vitals and nursing note reviewed.  Constitutional:      Appearance: Normal appearance.  Pulmonary:     Effort: Pulmonary effort is normal.  Abdominal:     General: Abdomen is flat. There is no distension.     Tenderness: There is no abdominal tenderness.  Skin:    General: Skin is warm and dry.  Neurological:     Mental Status: She is alert.     ED Results  / Procedures / Treatments   Labs (all labs ordered are listed, but only abnormal results are displayed) Labs Reviewed  URINALYSIS, ROUTINE W REFLEX MICROSCOPIC - Abnormal; Notable for the following components:      Result Value   APPearance CLOUDY (*)    Hgb urine dipstick LARGE (*)    Protein, ur >300 (*)    Leukocytes,Ua LARGE (*)    Bacteria, UA FEW (*)    Non Squamous Epithelial 0-5 (*)    All other components within normal limits  PREGNANCY, URINE    EKG None  Radiology No results found.  Procedures Procedures    Medications Ordered in ED Medications  cephALEXin (KEFLEX) capsule 500 mg (has no administration in time range)  phenazopyridine (PYRIDIUM) tablet 200 mg (has no administration in time range)    ED Course/ Medical Decision Making/ A&P  Urinalysis and presentation most consistent with a UTI.  Patient will be treated with Keflex and Pyreddy him.  To follow-up with primary doctor if not improving.  Final Clinical Impression(s) / ED Diagnoses Final diagnoses:  None    Rx / DC Orders ED Discharge Orders     None         Geoffery Lyons, MD 06/16/23 [phone]

## 2023-06-16 NOTE — Discharge Instructions (Signed)
Begin taking Keflex and Pyridium as prescribed.  Drink plenty of fluids.  Follow-up with primary doctor if the blood in your urine has not cleared in the next few days, and return to the ER if you develop severe abdominal pain, high fevers, or other new and concerning symptoms.

## 2023-06-19 DIAGNOSIS — E559 Vitamin D deficiency, unspecified: Secondary | ICD-10-CM | POA: Diagnosis not present

## 2023-06-19 DIAGNOSIS — L02425 Furuncle of right lower limb: Secondary | ICD-10-CM | POA: Diagnosis not present

## 2023-06-19 DIAGNOSIS — L728 Other follicular cysts of the skin and subcutaneous tissue: Secondary | ICD-10-CM | POA: Diagnosis not present

## 2023-06-19 DIAGNOSIS — L700 Acne vulgaris: Secondary | ICD-10-CM | POA: Diagnosis not present

## 2023-06-19 DIAGNOSIS — L810 Postinflammatory hyperpigmentation: Secondary | ICD-10-CM | POA: Diagnosis not present

## 2023-06-19 DIAGNOSIS — L83 Acanthosis nigricans: Secondary | ICD-10-CM | POA: Diagnosis not present

## 2023-06-19 DIAGNOSIS — L708 Other acne: Secondary | ICD-10-CM | POA: Diagnosis not present

## 2023-07-16 ENCOUNTER — Encounter: Payer: Self-pay | Admitting: Obstetrics and Gynecology

## 2023-07-17 ENCOUNTER — Other Ambulatory Visit: Payer: Self-pay | Admitting: *Deleted

## 2023-07-17 MED ORDER — TARON-C DHA 35-1 MG PO CAPS: 1.0000 | ORAL_CAPSULE | Freq: Every day | ORAL | 10 refills | Status: AC

## 2023-07-24 ENCOUNTER — Other Ambulatory Visit (HOSPITAL_COMMUNITY)
Admission: RE | Admit: 2023-07-24 | Discharge: 2023-07-24 | Disposition: A | Discharge status: Home / Self Care | Payer: Medicaid Other | Source: Ambulatory Visit | Attending: Obstetrics and Gynecology | Admitting: Obstetrics and Gynecology

## 2023-07-24 ENCOUNTER — Ambulatory Visit: Payer: Medicaid Other

## 2023-07-24 DIAGNOSIS — Z113 Encounter for screening for infections with a predominantly sexual mode of transmission: Secondary | ICD-10-CM | POA: Insufficient documentation

## 2023-07-24 NOTE — Progress Notes (Signed)
SUBJECTIVE:  27 y.o. female who desires a STI screen. Denies abnormal vaginal discharge, bleeding or significant pelvic pain. No UTI symptoms. Denies history of known exposure to STD.  No LMP recorded.  OBJECTIVE:  She appears well.   ASSESSMENT:  STI Screen   PLAN:  Pt offered STI blood screening-declined GC, chlamydia, and trichomonas probe sent to lab.  Treatment: To be determined once lab results are received.  Pt follow up as needed.

## 2023-07-25 LAB — CERVICOVAGINAL ANCILLARY ONLY
Bacterial Vaginitis (gardnerella): POSITIVE — AB
Candida Glabrata: NEGATIVE
Candida Vaginitis: NEGATIVE
Chlamydia: NEGATIVE
Comment: NEGATIVE
Comment: NEGATIVE
Comment: NEGATIVE
Comment: NEGATIVE
Comment: NEGATIVE
Comment: NORMAL
Neisseria Gonorrhea: NEGATIVE
Trichomonas: NEGATIVE

## 2023-08-25 DIAGNOSIS — Z111 Encounter for screening for respiratory tuberculosis: Secondary | ICD-10-CM | POA: Diagnosis not present

## 2023-08-25 DIAGNOSIS — Z0000 Encounter for general adult medical examination without abnormal findings: Secondary | ICD-10-CM | POA: Diagnosis not present

## 2023-08-25 DIAGNOSIS — N898 Other specified noninflammatory disorders of vagina: Secondary | ICD-10-CM | POA: Diagnosis not present

## 2023-08-25 DIAGNOSIS — Z3200 Encounter for pregnancy test, result unknown: Secondary | ICD-10-CM | POA: Diagnosis not present

## 2023-08-25 DIAGNOSIS — R300 Dysuria: Secondary | ICD-10-CM | POA: Diagnosis not present

## 2023-10-27 ENCOUNTER — Ambulatory Visit

## 2023-10-28 ENCOUNTER — Ambulatory Visit (INDEPENDENT_AMBULATORY_CARE_PROVIDER_SITE_OTHER)

## 2023-10-28 ENCOUNTER — Other Ambulatory Visit (HOSPITAL_COMMUNITY)
Admission: RE | Admit: 2023-10-28 | Discharge: 2023-10-28 | Disposition: A | Discharge status: Home / Self Care | Source: Ambulatory Visit | Attending: Obstetrics & Gynecology | Admitting: Obstetrics & Gynecology

## 2023-10-28 DIAGNOSIS — N3000 Acute cystitis without hematuria: Secondary | ICD-10-CM

## 2023-10-28 DIAGNOSIS — R300 Dysuria: Secondary | ICD-10-CM | POA: Insufficient documentation

## 2023-10-28 DIAGNOSIS — Z113 Encounter for screening for infections with a predominantly sexual mode of transmission: Secondary | ICD-10-CM | POA: Insufficient documentation

## 2023-10-28 NOTE — Progress Notes (Signed)
 SUBJECTIVE:  28 y.o. female complains of   dysuria and has new sexual partner and is wanting STD screening.  Denies abnormal vaginal bleeding or significant pelvic pain or fever.  No LMP recorded.  OBJECTIVE:  She appears alert, well appearing, in no apparent distress Urine dipstick:    ASSESSMENT:  Vaginal Discharge  Vaginal Odor Dysuria    PLAN:  GC, chlamydia, trichomonas, probe and urine culture sent to lab. Treatment: To be determined once lab results are received ROV prn if symptoms persist or worsen.

## 2023-10-30 ENCOUNTER — Encounter: Payer: Self-pay | Admitting: Obstetrics & Gynecology

## 2023-10-30 LAB — CERVICOVAGINAL ANCILLARY ONLY
Chlamydia: NEGATIVE
Comment: NEGATIVE
Comment: NEGATIVE
Comment: NORMAL
Neisseria Gonorrhea: NEGATIVE
Trichomonas: NEGATIVE

## 2023-10-31 ENCOUNTER — Ambulatory Visit: Admitting: Obstetrics & Gynecology

## 2023-10-31 ENCOUNTER — Encounter: Payer: Self-pay | Admitting: Obstetrics & Gynecology

## 2023-10-31 ENCOUNTER — Other Ambulatory Visit: Payer: Self-pay

## 2023-10-31 VITALS — BP 122/80 | HR 69 | Wt 211.0 lb

## 2023-10-31 DIAGNOSIS — N94818 Other vulvodynia: Secondary | ICD-10-CM | POA: Diagnosis not present

## 2023-10-31 DIAGNOSIS — B372 Candidiasis of skin and nail: Secondary | ICD-10-CM | POA: Diagnosis not present

## 2023-10-31 LAB — POCT URINALYSIS DIPSTICK

## 2023-10-31 LAB — URINE CULTURE

## 2023-10-31 MED ORDER — FLUCONAZOLE 150 MG PO TABS: 150.0000 mg | ORAL_TABLET | Freq: Once | ORAL | 3 refills | Status: AC

## 2023-10-31 MED ORDER — TRIAMCINOLONE ACETONIDE 0.5 % EX CREA: TOPICAL_CREAM | CUTANEOUS | 0 refills | Status: AC

## 2023-10-31 MED ORDER — CEFADROXIL 500 MG PO CAPS
500.0000 mg | ORAL_CAPSULE | Freq: Two times a day (BID) | ORAL | 0 refills | Status: AC
Start: 2023-10-31 — End: 2023-11-05

## 2023-10-31 MED ORDER — NITROFURANTOIN MONOHYD MACRO 100 MG PO CAPS: 100.0000 mg | ORAL_CAPSULE | Freq: Two times a day (BID) | ORAL | 1 refills | Status: AC

## 2023-10-31 MED ORDER — NYSTATIN 100000 UNIT/GM EX CREA: TOPICAL_CREAM | CUTANEOUS | 1 refills | Status: AC

## 2023-10-31 NOTE — Addendum Note (Signed)
 Addended by: Jaynie Collins A on: 10/31/2023 04:18 PM   Modules accepted: Orders

## 2023-10-31 NOTE — Progress Notes (Signed)
 Pt requesting medication for possible UTI, we are awaiting urine culture, pt stating that she feels it is bladder infection, medication sent in.

## 2023-10-31 NOTE — Progress Notes (Signed)
 GYNECOLOGY OFFICE VISIT NOTE  History:   [name] Collier is a 28 y.o. G1P1001 here today for discomfort of her vulvar for over a week. A lot of irritation at the junction of her vulva and inner thighs, also around her clitoris. No abnormal discharge, had a negative cervicovaginal ancillary testing on 10/28/23.  Urine culture pending. She denies any abnormal vaginal discharge, bleeding, dysuria pelvic pain or other concerns.    Past Medical History:  Diagnosis Date   Anemia    Asthma    Blood in stool    Dysfunctional uterine bleeding 07/01/2013   Gestational hypertension w/o significant proteinuria in 3rd trimester 10/11/2022   Gonorrhea affecting pregnancy in first trimester 08/01/2022   [X]  toc: neg   Headache    PCOS (polycystic ovarian syndrome)    Vaginal Pap smear, abnormal     Past Surgical History:  Procedure Laterality Date   CESAREAN SECTION N/A 11/30/2022   Procedure: CESAREAN SECTION;  Surgeon: [place] Bing, MD;  Location: [facility] LD ORS;  Service: Obstetrics;  Laterality: N/A;   TONSILLECTOMY     TYMPANOSTOMY TUBE PLACEMENT      The following portions of the patient's history were reviewed and updated as appropriate: allergies, current medications, past family history, past medical history, past social history, past surgical history and problem list.   Health Maintenance:  LGSIL pap on 01/08/2023.   Review of Systems:  Pertinent items noted in HPI and remainder of comprehensive ROS otherwise negative.  Physical Exam:  BP 122/80   Pulse 69   Wt 211 lb (95.7 kg)   BMI 34.06 kg/m  CONSTITUTIONAL: Well-developed, well-nourished female in no acute distress.  MUSCULOSKELETAL: Normal range of motion. No edema noted. NEUROLOGIC: Alert and oriented to person, place, and time. Normal muscle tone coordination. No cranial nerve deficit noted on observation. PSYCHIATRIC: Normal mood and affect. Normal behavior. Normal judgment and thought content. CARDIOVASCULAR: Normal  heart rate noted RESPIRATORY: Effort and breath sounds normal, no problems with respiration noted ABDOMEN: No masses or other overt distention noted on observation. No tenderness.   PELVIC:  Hyperpigmented, excoriated inflammation noted on junction btween vulva and inner thighs bilaterally, also on lower part of mons pubis around clitoral hood.  No lesions.  Normal architecture of external genitalia; normal urethral meatus; normal appearing distal vaginal mucosa.  White discharge noted.  Performed in the presence of a chaperone  Labs and Imaging Results for orders placed or performed in visit on 10/28/23 (from the past week)  Cervicovaginal ancillary only   Collection Time: 10/28/23  2:22 PM  Result Value Ref Range   Neisseria Gonorrhea Negative    Chlamydia Negative    Trichomonas Negative    Comment Normal Reference Ranger Chlamydia - Negative    Comment      Normal Reference Range Neisseria Gonorrhea - Negative   Comment Normal Reference Range Trichomonas - Negative    No results found.    Assessment and Plan:     1. Vulvar discomfort  (Primary) 2. Yeast infection of the skin Examination concerning for skin yeast infection, this was discussed with patient. Medications prescribed to help with this and also with inflammation around clitoral hood. Patient reports washing her vulva with Dial soap.  Proper vulvar hygiene emphasized: discussed avoidance of perfumed soaps (Dove sensitive bar soap recommended), detergents, lotions and any type of douches; in addition to wearing cotton underwear and no underwear at night.  Also recommended cleaning front to back, voiding and cleaning up after  intercourse. Also keeping area dry and clean after after exercise. - nystatin cream (MYCOSTATIN); Apply 2 g to affected inner thigh area twice a day for 10 days  Dispense: 60 g; Refill: 1 - triamcinolone cream (KENALOG) 0.5 %; Apply Bozard amount (about 1 g) to clitoris twice a day for 7 days  Dispense: 30 g;  Refill: 0  Routine preventative health maintenance measures emphasized. Please refer to After Visit Summary for other counseling recommendations.   Return for any gynecologic concerns.    I spent 25 minutes dedicated to the care of this patient including pre-visit review of records, face to face time with the patient discussing her conditions and treatments, post visit ordering of medications and appropriate tests or procedures, coordinating care and documenting this visit encounter.    Jaynie Collins, MD, FACOG Obstetrician & Gynecologist, [facility] for [employer], [facility]

## 2023-11-03 ENCOUNTER — Encounter: Payer: Self-pay | Admitting: Internal Medicine

## 2023-11-03 MED ORDER — CETIRIZINE HCL 10 MG PO TABS: 10.0000 mg | ORAL_TABLET | Freq: Every day | ORAL | 0 refills | Status: AC | PRN

## 2023-12-15 NOTE — Progress Notes (Signed)
 Patient did not arrive to appointment. May reschedule.  Raford Bunk, MSN, CNM, RNC-OB Certified Nurse Midwife, [facility] 12/19/2023 8:44 AM

## 2023-12-18 ENCOUNTER — Ambulatory Visit: Admitting: Certified Nurse Midwife

## 2024-02-01 DIAGNOSIS — L03317 Cellulitis of buttock: Secondary | ICD-10-CM | POA: Diagnosis not present

## 2024-02-01 DIAGNOSIS — T63461A Toxic effect of venom of wasps, accidental (unintentional), initial encounter: Secondary | ICD-10-CM | POA: Diagnosis not present

## 2024-02-13 ENCOUNTER — Other Ambulatory Visit: Payer: Self-pay | Admitting: Internal Medicine

## 2024-03-24 ENCOUNTER — Ambulatory Visit

## 2024-03-24 ENCOUNTER — Other Ambulatory Visit (HOSPITAL_COMMUNITY)
Admission: RE | Admit: 2024-03-24 | Discharge: 2024-03-24 | Disposition: A | Discharge status: Home / Self Care | Source: Ambulatory Visit | Attending: Family Medicine | Admitting: Family Medicine

## 2024-03-24 DIAGNOSIS — Z113 Encounter for screening for infections with a predominantly sexual mode of transmission: Secondary | ICD-10-CM | POA: Diagnosis present

## 2024-03-24 NOTE — Progress Notes (Addendum)
  SUBJECTIVE:  28 y.o. female in office for STD testing.  Pt has new partner and would like std screening today via self swab.    OBJECTIVE:  She appears well, afebrile.   ASSESSMENT:  STD screening   PLAN:  GC, chlamydia, trichomonas, BVAG, CVAG probe sent to lab. Treatment: To be determined once lab results are received ROV prn if symptoms persist or worsen.

## 2024-03-25 ENCOUNTER — Ambulatory Visit: Payer: Self-pay | Admitting: Obstetrics & Gynecology

## 2024-03-25 LAB — CERVICOVAGINAL ANCILLARY ONLY
Bacterial Vaginitis (gardnerella): NEGATIVE
Candida Glabrata: NEGATIVE
Candida Vaginitis: NEGATIVE
Chlamydia: NEGATIVE
Comment: NEGATIVE
Comment: NEGATIVE
Comment: NEGATIVE
Comment: NEGATIVE
Comment: NEGATIVE
Comment: NORMAL
Neisseria Gonorrhea: NEGATIVE
Trichomonas: NEGATIVE

## 2024-03-26 ENCOUNTER — Emergency Department (HOSPITAL_COMMUNITY)

## 2024-03-26 ENCOUNTER — Encounter (HOSPITAL_COMMUNITY): Payer: Self-pay

## 2024-03-26 ENCOUNTER — Other Ambulatory Visit: Payer: Self-pay

## 2024-03-26 ENCOUNTER — Emergency Department (HOSPITAL_COMMUNITY)
Admission: EM | Admit: 2024-03-26 | Discharge: 2024-03-26 | Disposition: A | Discharge status: Home / Self Care | Attending: Emergency Medicine | Admitting: Emergency Medicine

## 2024-03-26 DIAGNOSIS — R0602 Shortness of breath: Secondary | ICD-10-CM | POA: Diagnosis present

## 2024-03-26 DIAGNOSIS — J4541 Moderate persistent asthma with (acute) exacerbation: Secondary | ICD-10-CM | POA: Diagnosis not present

## 2024-03-26 LAB — HCG, SERUM, QUALITATIVE: Preg, Serum: NEGATIVE

## 2024-03-26 MED ORDER — DEXAMETHASONE 4 MG PO TABS
10.0000 mg | ORAL_TABLET | Freq: Once | ORAL | Status: AC
  Administered 2024-03-26: 10 mg via ORAL
  Filled 2024-03-26: qty 3

## 2024-03-26 MED ORDER — IPRATROPIUM BROMIDE 0.02 % IN SOLN
0.5000 mg | Freq: Once | RESPIRATORY_TRACT | Status: AC
  Administered 2024-03-26: 0.5 mg via RESPIRATORY_TRACT
  Filled 2024-03-26: qty 2.5

## 2024-03-26 MED ORDER — ACETAMINOPHEN 500 MG PO TABS
1000.0000 mg | ORAL_TABLET | Freq: Once | ORAL | Status: AC
  Administered 2024-03-26: 1000 mg via ORAL
  Filled 2024-03-26: qty 2

## 2024-03-26 MED ORDER — ALBUTEROL SULFATE (2.5 MG/3ML) 0.083% IN NEBU: 2.5000 mg | INHALATION_SOLUTION | Freq: Four times a day (QID) | RESPIRATORY_TRACT | 12 refills | Status: AC | PRN

## 2024-03-26 MED ORDER — ALBUTEROL SULFATE (2.5 MG/3ML) 0.083% IN NEBU
5.0000 mg | INHALATION_SOLUTION | Freq: Once | RESPIRATORY_TRACT | Status: AC
  Administered 2024-03-26: 5 mg via RESPIRATORY_TRACT
  Filled 2024-03-26: qty 6

## 2024-03-26 NOTE — ED Triage Notes (Addendum)
 Pt came in via POV d/t SOB that started last night when she was cleaning after mixing together vinegar & bleach. Endorses her home inhaler has not helped, does have Hx of asthma.

## 2024-03-26 NOTE — ED Notes (Signed)
Patient verbalizes understanding of discharge instructions. Opportunity for questioning and answers were provided. Armband removed by staff, pt discharged from ED. Ambulated out to lobby  

## 2024-03-26 NOTE — ED Provider Notes (Signed)
 [place] EMERGENCY DEPARTMENT AT [facility] Provider Note   CSN: [id number] Arrival date & time: 03/26/24  1717     Patient presents with: SOB   [name] Collier is a 28 y.o. female.   Patient is a 28 year old female with a history of asthma who is presenting today with complaints of chest tightness and shortness of breath.  Patient reports that she was in her normal state of health when she was cleaning yesterday and she mixed with vinegar and bleach.  She reports this made a very strong smell that made her start to feel tightness in her chest and shortness of breath.  She went to bed but when she woke up this morning the symptoms were still there and she reports some pleuritic pain with deep breaths.  When she was at work she started feeling more tightness in her chest and shortness of breath and tried her inhaler and reported it did not help.  She currently is having pain with inspiration and still a tightness.  She also reports getting watery and itchy eyes when this happened as well.  No fever.  No difficulty swallowing.  No voice changes.  No hemoptysis.  The history is provided by the patient.       Prior to Admission medications   Medication Sig Start Date End Date Taking? Authorizing Provider  albuterol  (PROVENTIL ) (2.5 MG/3ML) 0.083% nebulizer solution Take 3 mLs (2.5 mg total) by nebulization every 6 (six) hours as needed for wheezing or shortness of breath. 03/26/24   Doretha Folks, MD  albuterol  (VENTOLIN  HFA) 108 (90 Base) MCG/ACT inhaler Inhale 2 puffs into the lungs every 6 (six) hours as needed. 04/30/23   Tobie Arleta SQUIBB, MD  azelastine  (ASTELIN ) 0.1 % nasal spray Place 1 spray into both nostrils 2 (two) times daily as needed. Use in each nostril as directed 04/30/23   Tobie Arleta SQUIBB, MD  cetirizine  (ZYRTEC  ALLERGY ) 10 MG tablet Take 1 tablet (10 mg total) by mouth daily as needed for allergies. 11/03/23   Tobie Arleta SQUIBB, MD  cholecalciferol (VITAMIN D3) 25 MCG  (1000 UNIT) tablet Take 1,000 Units by mouth daily. Patient not taking: Reported on 10/31/2023    [provider]  EPINEPHrine  (EPIPEN  2-PAK) 0.3 mg/0.3 mL IJ SOAJ injection Inject 0.3 mg into the muscle as needed for anaphylaxis. 04/30/23   Tobie Arleta SQUIBB, MD  ferrous sulfate  325 (65 FE) MG tablet Take 1 tablet (325 mg total) by mouth every other day for 30 doses. 12/03/22 04/30/23  Izell Harari, MD  ibuprofen  (ADVIL ) 600 MG tablet Take 1 tablet (600 mg total) by mouth every 6 (six) hours as needed. Patient not taking: Reported on 10/31/2023 12/03/22   Izell Harari, MD  nitrofurantoin , macrocrystal-monohydrate, (MACROBID ) 100 MG capsule Take 1 capsule (100 mg total) by mouth 2 (two) times daily. 10/31/23   Anyanwu, Ugonna A, MD  nystatin  cream (MYCOSTATIN ) Apply 2 g to affected inner thigh area twice a day for 10 days 10/31/23   Anyanwu, Ugonna A, MD  polyethylene glycol powder (GLYCOLAX /MIRALAX ) 17 GM/SCOOP powder Take 17 g by mouth daily as needed. Patient not taking: Reported on 10/31/2023 12/03/22   Izell Harari, MD  Prenat-FeFum-FePo-FA-Omega 3 (TARON-C DHA) 35-1 MG CAPS Take 1 capsule by mouth daily. 07/17/23   Anyanwu, Ugonna A, MD  triamcinolone  cream (KENALOG ) 0.5 % Apply [place] amount (about 1 g) to clitoris twice a day for 7 days 10/31/23   Anyanwu, Ugonna A, MD  Allergies: Black walnut flavoring agent (non-screening), Cherry, Peach [prunus persica], Peanut -containing drug products, Almond (diagnostic), Aspirin , [place] nut (berthollefia excelsa), Cashew nut (anacardium occidentale) skin test, Macadamia nut oil, and Pistachio nut (diagnostic)    Review of Systems  Updated Vital Signs BP 133/65   Pulse 74   Temp 98.4 F (36.9 C) (Axillary)   Resp (!) 21   SpO2 100%   Physical Exam Vitals and nursing note reviewed.  Constitutional:      General: She is not in acute distress.    Appearance: She is well-developed.  HENT:     Head: Normocephalic and atraumatic.  Eyes:      Pupils: Pupils are equal, round, and reactive to light.  Cardiovascular:     Rate and Rhythm: Normal rate and regular rhythm.     Heart sounds: Normal heart sounds. No murmur heard.    No friction rub.  Pulmonary:     Effort: Pulmonary effort is normal.     Breath sounds: Normal breath sounds. Decreased air movement present. No wheezing or rales.  Abdominal:     General: Bowel sounds are normal. There is no distension.     Palpations: Abdomen is soft.     Tenderness: There is no abdominal tenderness. There is no guarding or rebound.  Musculoskeletal:        General: No tenderness. Normal range of motion.     Comments: No edema  Skin:    General: Skin is warm and dry.     Findings: No rash.  Neurological:     Mental Status: She is alert and oriented to person, place, and time.     Cranial Nerves: No cranial nerve deficit.  Psychiatric:        Behavior: Behavior normal.     (all labs ordered are listed, but only abnormal results are displayed) Labs Reviewed  HCG, SERUM, QUALITATIVE    EKG: EKG Interpretation Date/Time:  Friday March 26 2024 17:33:52 EDT Ventricular Rate:  73 PR Interval:  142 QRS Duration:  98 QT Interval:  376 QTC Calculation: 414 R Axis:   66  Text Interpretation: Normal sinus rhythm Normal ECG When compared with ECG of 11-Oct-2022 20:59, PREVIOUS ECG IS PRESENT No significant change since last tracing Confirmed by Doretha Folks ([id number]) on 03/26/2024 9:15:48 PM  Radiology: [facility] Chest 2 View Result Date: 03/26/2024 CLINICAL DATA:  Shortness of breath and chest pain EXAM: CHEST - 2 VIEW COMPARISON:  10/31/2018 FINDINGS: The heart size and mediastinal contours are within normal limits. Both lungs are clear. The visualized skeletal structures are unremarkable. IMPRESSION: No active cardiopulmonary disease. Electronically Signed   By: CHRISTELLA.  Shick M.D.   On: 03/26/2024 18:30     Procedures   Medications Ordered in the ED  ipratropium (ATROVENT ) nebulizer  solution 0.5 mg (0.5 mg Nebulization Given 03/26/24 2216)  albuterol  (PROVENTIL ) (2.5 MG/3ML) 0.083% nebulizer solution 5 mg (5 mg Nebulization Given 03/26/24 2216)  dexamethasone  (DECADRON ) tablet 10 mg (10 mg Oral Given 03/26/24 2216)  acetaminophen  (TYLENOL ) tablet 1,000 mg (1,000 mg Oral Given 03/26/24 2216)                                    Medical Decision Making Amount and/or Complexity of Data Reviewed Labs: ordered. Radiology: ordered and independent interpretation performed. Decision-making details documented in ED Course. ECG/medicine tests: ordered and independent interpretation performed. Decision-making details documented in ED Course.  Risk  OTC drugs. Prescription drug management.   Pt with multiple medical problems and comorbidities and presenting today with a complaint that caries a high risk for morbidity and mortality.  Here today with the above complaints.  Concern for asthma exacerbation after exposure to strong fumes, pneumothorax, pleurisy.  Low suspicion for PE patient is PERC negative.  Low suspicion for pneumonia.  Low suspicion for ACS. I dependently interpreted patient's EKG and it shows a normal sinus rhythm without acute findings.  Pregnancy test is negative.  I have independently visualized and interpreted pt's images today. Chest x-ray is within normal limits.  Patient is satting at 100% on room air and in no acute distress.  Will try a breathing treatment and a dose of Decadron .   10:58 PM After treatment pt states SOB has gone away but she is till having the pain with breathing.  Will give her refills for her nebulizer and discussed tylenol  as needed for the chest pain.  At this time stable for d/c.     Final diagnoses:  Moderate persistent asthma with exacerbation    ED Discharge Orders          Ordered    albuterol  (PROVENTIL ) (2.5 MG/3ML) 0.083% nebulizer solution  Every 6 hours PRN        03/26/24 2256               Doretha Folks,  MD 03/26/24 2258

## 2024-03-26 NOTE — Discharge Instructions (Addendum)
 X-ray and EKG today were normal.  You were given a steroid here that will last for 3 days.  Use the nebulizer or your inhaler as needed for chest tightness or wheezing.  Return if your shortness of breath gets worse or you develop fever, coughing up blood, passing out or other concerns.

## 2024-04-18 ENCOUNTER — Other Ambulatory Visit: Payer: Self-pay

## 2024-04-18 ENCOUNTER — Emergency Department (HOSPITAL_BASED_OUTPATIENT_CLINIC_OR_DEPARTMENT_OTHER)

## 2024-04-18 ENCOUNTER — Encounter (HOSPITAL_BASED_OUTPATIENT_CLINIC_OR_DEPARTMENT_OTHER): Payer: Self-pay

## 2024-04-18 ENCOUNTER — Emergency Department (HOSPITAL_BASED_OUTPATIENT_CLINIC_OR_DEPARTMENT_OTHER)
Admission: EM | Admit: 2024-04-18 | Discharge: 2024-04-18 | Disposition: A | Discharge status: Home / Self Care | Attending: Emergency Medicine | Admitting: Emergency Medicine

## 2024-04-18 DIAGNOSIS — O2691 Pregnancy related conditions, unspecified, first trimester: Secondary | ICD-10-CM | POA: Insufficient documentation

## 2024-04-18 DIAGNOSIS — O26891 Other specified pregnancy related conditions, first trimester: Secondary | ICD-10-CM | POA: Insufficient documentation

## 2024-04-18 DIAGNOSIS — Z91010 Allergy to peanuts: Secondary | ICD-10-CM | POA: Insufficient documentation

## 2024-04-18 DIAGNOSIS — R42 Dizziness and giddiness: Secondary | ICD-10-CM

## 2024-04-18 DIAGNOSIS — R1031 Right lower quadrant pain: Secondary | ICD-10-CM | POA: Diagnosis not present

## 2024-04-18 DIAGNOSIS — M25512 Pain in left shoulder: Secondary | ICD-10-CM | POA: Diagnosis not present

## 2024-04-18 DIAGNOSIS — Z3A01 Less than 8 weeks gestation of pregnancy: Secondary | ICD-10-CM | POA: Insufficient documentation

## 2024-04-18 LAB — URINALYSIS, ROUTINE W REFLEX MICROSCOPIC
Bilirubin Urine: NEGATIVE
Glucose, UA: NEGATIVE mg/dL
Hgb urine dipstick: NEGATIVE
Ketones, ur: NEGATIVE mg/dL
Leukocytes,Ua: NEGATIVE
Nitrite: NEGATIVE
Protein, ur: NEGATIVE mg/dL
Specific Gravity, Urine: 1.023 (ref 1.005–1.030)
pH: 7 (ref 5.0–8.0)

## 2024-04-18 LAB — CBC WITH DIFFERENTIAL/PLATELET
Abs Immature Granulocytes: 0.02 K/uL (ref 0.00–0.07)
Basophils Absolute: 0 K/uL (ref 0.0–0.1)
Basophils Relative: 0 %
Eosinophils Absolute: 0.2 K/uL (ref 0.0–0.5)
Eosinophils Relative: 2 %
HCT: 34.2 % — ABNORMAL LOW (ref 36.0–46.0)
Hemoglobin: 11.2 g/dL — ABNORMAL LOW (ref 12.0–15.0)
Immature Granulocytes: 0 %
Lymphocytes Relative: 24 %
Lymphs Abs: 1.8 K/uL (ref 0.7–4.0)
MCH: 25.1 pg — ABNORMAL LOW (ref 26.0–34.0)
MCHC: 32.7 g/dL (ref 30.0–36.0)
MCV: 76.7 fL — ABNORMAL LOW (ref 80.0–100.0)
Monocytes Absolute: 0.6 K/uL (ref 0.1–1.0)
Monocytes Relative: 8 %
Neutro Abs: 4.9 K/uL (ref 1.7–7.7)
Neutrophils Relative %: 66 %
Platelets: 269 K/uL (ref 150–400)
RBC: 4.46 MIL/uL (ref 3.87–5.11)
RDW: 14.6 % (ref 11.5–15.5)
WBC: 7.5 K/uL (ref 4.0–10.5)
nRBC: 0 % (ref 0.0–0.2)

## 2024-04-18 LAB — COMPREHENSIVE METABOLIC PANEL WITH GFR
ALT: 12 U/L (ref 0–44)
AST: 16 U/L (ref 15–41)
Albumin: 4.7 g/dL (ref 3.5–5.0)
Alkaline Phosphatase: 67 U/L (ref 38–126)
Anion gap: 13 (ref 5–15)
BUN: 9 mg/dL (ref 6–20)
CO2: 22 mmol/L (ref 22–32)
Calcium: 9.7 mg/dL (ref 8.9–10.3)
Chloride: 102 mmol/L (ref 98–111)
Creatinine, Ser: 0.72 mg/dL (ref 0.44–1.00)
GFR, Estimated: >60 mL/min (ref >60)
Glucose, Bld: 89 mg/dL (ref 70–99)
Potassium: 3.9 mmol/L (ref 3.5–5.1)
Sodium: 137 mmol/L (ref 135–145)
Total Bilirubin: 0.3 mg/dL (ref 0.0–1.2)
Total Protein: 7.6 g/dL (ref 6.5–8.1)

## 2024-04-18 LAB — LIPASE, BLOOD: Lipase: 24 U/L (ref 11–51)

## 2024-04-18 LAB — HCG, QUANTITATIVE, PREGNANCY: hCG, Beta Chain, Quant, S: 10449 m[IU]/mL — ABNORMAL HIGH (ref <5)

## 2024-04-18 MED ORDER — SODIUM CHLORIDE 0.9 % IV BOLUS
1000.0000 mL | Freq: Once | INTRAVENOUS | Status: AC
  Administered 2024-04-18: 1000 mL via INTRAVENOUS

## 2024-04-18 NOTE — ED Triage Notes (Signed)
 Reports LLR quadrant pain x 2 days. + positive pregnancy test. Denies vaginal bleeding but c/o left shoulder pain and dizziness.   7/29 is first day of LMP.

## 2024-04-18 NOTE — ED Notes (Signed)
 Pt alert and oriented X 4 at the time of discharge. RR even and unlabored. No acute distress noted. Pt verbalized understanding of discharge instructions as discussed. Pt ambulatory to lobby at time of discharge.

## 2024-04-18 NOTE — ED Provider Notes (Signed)
 [place] EMERGENCY DEPARTMENT AT [facility] Provider Note   CSN: [id number] Arrival date & time: 04/18/24  1402     Patient presents with: Abdominal Pain   [name] Collier is a 28 y.o. female.   Patient with last menstrual period at the end of July presents to the emergency department for evaluation of lightheadedness and syncope as well as right lower abdominal pain with radiation to the left shoulder.  Patient reports being in her usual state of health, recent home pregnancy test was positive, [name] Collier has had typical nausea with the pregnancy.  [name] Collier woke today with lightheadedness, states that [name] Collier passed out 3 times at work.  [name] Collier has significant left shoulder pain.  No shortness of breath or cough.  No fevers.  No urinary symptoms.  Denies history of previous surgeries.  With previous pregnancy [name] Collier had preeclampsia towards her due date.  Patient denies risk factors for pulmonary embolism including: unilateral leg swelling, history of DVT/PE/other blood clots, use of exogenous hormones, recent immobilizations, recent surgery, recent travel (>4hr segment), malignancy, hemoptysis.        Prior to Admission medications   Medication Sig Start Date End Date Taking? Authorizing Provider  albuterol  (PROVENTIL ) (2.5 MG/3ML) 0.083% nebulizer solution Take 3 mLs (2.5 mg total) by nebulization every 6 (six) hours as needed for wheezing or shortness of breath. 03/26/24   Doretha Folks, MD  albuterol  (VENTOLIN  HFA) 108 (90 Base) MCG/ACT inhaler Inhale 2 puffs into the lungs every 6 (six) hours as needed. 04/30/23   Tobie Arleta SQUIBB, MD  azelastine  (ASTELIN ) 0.1 % nasal spray Place 1 spray into both nostrils 2 (two) times daily as needed. Use in each nostril as directed 04/30/23   Tobie Arleta SQUIBB, MD  cetirizine  (ZYRTEC  ALLERGY ) 10 MG tablet Take 1 tablet (10 mg total) by mouth daily as needed for allergies. 11/03/23   Tobie Arleta SQUIBB, MD  cholecalciferol (VITAMIN D3) 25 MCG (1000 UNIT) tablet Take  1,000 Units by mouth daily. Patient not taking: Reported on 10/31/2023    [provider]  EPINEPHrine  (EPIPEN  2-PAK) 0.3 mg/0.3 mL IJ SOAJ injection Inject 0.3 mg into the muscle as needed for anaphylaxis. 04/30/23   Tobie Arleta SQUIBB, MD  ferrous sulfate  325 (65 FE) MG tablet Take 1 tablet (325 mg total) by mouth every other day for 30 doses. 12/03/22 04/30/23  Izell Harari, MD  ibuprofen  (ADVIL ) 600 MG tablet Take 1 tablet (600 mg total) by mouth every 6 (six) hours as needed. Patient not taking: Reported on 10/31/2023 12/03/22   Izell Harari, MD  nitrofurantoin , macrocrystal-monohydrate, (MACROBID ) 100 MG capsule Take 1 capsule (100 mg total) by mouth 2 (two) times daily. 10/31/23   Anyanwu, Ugonna A, MD  nystatin  cream (MYCOSTATIN ) Apply 2 g to affected inner thigh area twice a day for 10 days 10/31/23   Anyanwu, Ugonna A, MD  polyethylene glycol powder (GLYCOLAX /MIRALAX ) 17 GM/SCOOP powder Take 17 g by mouth daily as needed. Patient not taking: Reported on 10/31/2023 12/03/22   Izell Harari, MD  Prenat-FeFum-FePo-FA-Omega 3 (TARON-C DHA) 35-1 MG CAPS Take 1 capsule by mouth daily. 07/17/23   Anyanwu, Ugonna A, MD  triamcinolone  cream (KENALOG ) 0.5 % Apply [place] amount (about 1 g) to clitoris twice a day for 7 days 10/31/23   Anyanwu, Ugonna A, MD    Allergies: Black walnut flavoring agent (non-screening), Cherry, Peach [prunus persica], Peanut -containing drug products, Almond (diagnostic), Aspirin , [place] nut (berthollefia excelsa), Cashew nut (anacardium occidentale) skin test, Macadamia nut oil,  and Pistachio nut (diagnostic)    Review of Systems  Updated Vital Signs BP (!) 152/83 (BP Location: Right Arm)   Pulse 68   Temp 98.7 F (37.1 C)   Resp 18   SpO2 100%   Physical Exam Vitals and nursing note reviewed.  Constitutional:      General: [name] Collier is not in acute distress.    Appearance: [name] Collier is well-developed.  HENT:     Head: Normocephalic and atraumatic.     Right Ear: External  ear normal.     Left Ear: External ear normal.     Nose: Nose normal.  Eyes:     Conjunctiva/sclera: Conjunctivae normal.  Cardiovascular:     Rate and Rhythm: Normal rate and regular rhythm.     Heart sounds: No murmur heard. Pulmonary:     Effort: No respiratory distress.     Breath sounds: No wheezing, rhonchi or rales.  Abdominal:     Palpations: Abdomen is soft.     Tenderness: There is abdominal tenderness (mild) in the right lower quadrant. There is no guarding or rebound. Negative signs include Murphy's sign and McBurney's sign.  Musculoskeletal:     Cervical back: Normal range of motion and neck supple.     Right lower leg: No edema.     Left lower leg: No edema.  Skin:    General: Skin is warm and dry.     Findings: No rash.  Neurological:     General: No focal deficit present.     Mental Status: [name] Collier is alert. Mental status is at baseline.     Motor: No weakness.  Psychiatric:        Mood and Affect: Mood normal.     (all labs ordered are listed, but only abnormal results are displayed) Labs Reviewed  CBC WITH DIFFERENTIAL/PLATELET - Abnormal; Notable for the following components:      Result Value   Hemoglobin 11.2 (*)    HCT 34.2 (*)    MCV 76.7 (*)    MCH 25.1 (*)    All other components within normal limits  HCG, QUANTITATIVE, PREGNANCY - Abnormal; Notable for the following components:   hCG, Beta Chain, Quant, S 10,449 (*)    All other components within normal limits  COMPREHENSIVE METABOLIC PANEL WITH GFR  LIPASE, BLOOD  URINALYSIS, ROUTINE W REFLEX MICROSCOPIC    ED ECG REPORT   Date: 04/18/2024  Rate: 69  Rhythm: normal sinus rhythm  QRS Axis: normal  Intervals: normal  ST/T Wave abnormalities: normal  Conduction Disutrbances:none  Narrative Interpretation:   Old EKG Reviewed: unchanged from 03/30/2024  I have personally reviewed the EKG tracing and agree with the computerized printout as noted.   Radiology: No results  found.   Procedures   Medications Ordered in the ED  sodium chloride  0.9 % bolus 1,000 mL (has no administration in time range)   ED Course  Patient seen and examined. History obtained directly from patient.   Labs/EKG: Ordered CBC, CMP, lipase, quantitative hCG.  Imaging: Ordered ultrasound to evaluate for tubal pregnancy.  Medications/Fluids: Ordered: IV fluid bolus  Most recent vital signs reviewed and are as follows: BP (!) 152/83 (BP Location: Right Arm)   Pulse 68   Temp 98.7 F (37.1 C)   Resp 18   SpO2 100%   Initial impression: Lightheadedness, syncope, abdominal pain, shoulder pain in early pregnancy.  4:52 PM Reassessment performed. Patient appears stable.  Patient states that [name] Collier is feeling better after  IV fluids.  Her abdominal pain has gone away, and [name] Collier states that it comes and goes.  On exam, no rebound or guarding in the right lower quadrant.  [name] Collier states that the left shoulder pain, also improved, but not completely resolved.  Again, patient denies shortness of breath or other PE risk factors.  Labs personally reviewed and interpreted including: CBC with normal white blood cell count, hemoglobin slightly low at 11.2 not unexpected during pregnancy; CMP unremarkable; lipase normal; UA without any signs of bleeding or infection.  Quantitative hCG still pending, however at this point, given that ultrasound is back, would not alter management.  Imaging results reviewed: Ultrasound shows pregnancy early, and uterus, at 6 weeks and 0 days, no subchorionic hemorrhage visualized, yolk sac present but no embryo.  Reviewed pertinent lab work and imaging with patient at bedside. Questions answered.   We discussed that other intra-abdominal etiology of pain is possible.  An example would be appendicitis.  Currently symptoms are atypical, remainder of workup is reassuring.  At this point my overall suspicion for this is low.  After discussion with patient, agree to defer  additional imaging at this time, given early pregnancy.  We did discuss strict return precautions.  Discussed that if [name] Collier develops progressive abdominal pain that becomes more persistent or other symptoms such as fever, vomiting, diarrhea --[name] Collier should return to the emergency department or go to the MAU for further evaluation.  [name] Collier verbalizes understanding and agrees with this plan.  Most current vital signs reviewed and are as follows: BP 132/80 (BP Location: Right Arm)   Pulse 66   Temp 98.7 F (37.1 C)   Resp 18   LMP 02/24/2024   SpO2 100%   Plan: Discharge to home.   Prescriptions written for: None  Other home care instructions discussed: Close monitoring of symptoms  ED return instructions discussed: The patient was urged to return to the Emergency Department immediately with worsening of current symptoms, progressively worsening abdominal pain, persistent vomiting, blood noted in stools, fever, or any other concerns. The patient verbalized understanding.   Also discussed return with additional episodes of syncope, development of chest pain or shortness of breath.  Follow-up instructions discussed: Patient encouraged to follow-up with their OB/GYN in 1 week.                                  Medical Decision Making Amount and/or Complexity of Data Reviewed Labs: ordered. Radiology: ordered.   Patient here with symptoms in early pregnancy.  Had typical symptoms until today when [name] Collier developed lightheadedness and syncope while at work.  EKG was reassuring without evidence of WPW, Brugada syndrome, prolonged QT, heart block or other arrhythmia.  No evidence of profound anemia.  [name] Collier had some left shoulder pain but no shortness of breath or tachycardia to suggest PE.  No other DVT or PE risk factors.  No pleuritic pain.  Symptoms are very atypical for PE and feel that this is low likelihood.  Patient also with some right lower quadrant pain, minimal on exam, no rebound or guarding.  All  of her symptoms improved with administration of IV fluids.  Patient was evaluated for ectopic pregnancy and ultrasound shows early IUP.  Recommend follow-up as above.  On reexam, symptoms are improving, not worsening.  We discussed possibility of other potential causes of abdominal pain.  We agreed to defer additional imaging at this time but if  symptoms progress in the next day, [name] Collier needs to return for further evaluation.  Given resources for [facility] and OB/GYN.  The patient's vital signs, pertinent lab work and imaging were reviewed and interpreted as discussed in the ED course. Hospitalization was considered for further testing, treatments, or serial exams/observation. However as patient is well-appearing, has a stable exam, and reassuring studies today, I do not feel that they warrant admission at this time. This plan was discussed with the patient who verbalizes agreement and comfort with this plan and seems reliable and able to return to the Emergency Department with worsening or changing symptoms.       Final diagnoses:  Episodic lightheadedness  Right lower quadrant abdominal pain  Acute pain of left shoulder  Less than [redacted] weeks gestation of pregnancy    ED Discharge Orders     None          Desiderio Chew, PA-C 04/18/24 1709    Simon Lavonia SAILOR, MD 04/19/24 [phone]

## 2024-04-18 NOTE — Discharge Instructions (Addendum)
 In regards to your lightheadedness and abdominal pain, your lab work today is normal and as expected in pregnancy.  Your EKG did not show any signs of abnormal heart rhythms or other problems.  No signs of urinary tract infection or significant dehydration.  You were given IV fluids to help with this.  In regards to your pregnancy, we have confirmed that you have a pregnancy in the uterus.  It is still early on and embryo and heartbeat are not visualized but we do see a yolk sac inside of the uterus.  No evidence of a pregnancy in the tubes.  Per radiologist: Recommend correlation with quantitative HCG and serial follow-up quantitative HCG as well as follow-up ultrasound 10-14 days.   Please contact the OB/GYN referral above or follow-up with your own OB/GYN for this recommended workup.  As we discussed, if you have progressive abdominal pain that becomes more constant, you develop fever, vomiting, diarrhea, blood in the stool --you should return to the emergency department or go to the [facility] to be evaluated.  Your exam and lab workup today is currently reassuring, but we cannot entirely rule out other intra-abdominal causes of the abdominal pain such as appendicitis or gallbladder infection if you continue to have worsening symptoms.  It is very important that you follow-up if this occurs.

## 2024-06-04 DIAGNOSIS — R300 Dysuria: Secondary | ICD-10-CM | POA: Diagnosis not present

## 2024-06-04 DIAGNOSIS — N3001 Acute cystitis with hematuria: Secondary | ICD-10-CM | POA: Diagnosis not present

## 2024-09-01 ENCOUNTER — Telehealth: Admitting: Nurse Practitioner

## 2024-09-01 DIAGNOSIS — R3989 Other symptoms and signs involving the genitourinary system: Secondary | ICD-10-CM | POA: Diagnosis not present

## 2024-09-01 DIAGNOSIS — B3731 Acute candidiasis of vulva and vagina: Secondary | ICD-10-CM | POA: Diagnosis not present

## 2024-09-01 MED ORDER — FLUCONAZOLE 150 MG PO TABS: 150.0000 mg | ORAL_TABLET | Freq: Once | ORAL | 0 refills | Status: AC

## 2024-09-01 MED ORDER — CEPHALEXIN 500 MG PO CAPS: 500.0000 mg | ORAL_CAPSULE | Freq: Two times a day (BID) | ORAL | 0 refills | Status: AC

## 2024-09-01 NOTE — Progress Notes (Signed)
 " Virtual Visit Consent   [name] Collier, you are scheduled for a virtual visit with a [facility] provider today. Just as with appointments in the office, your consent must be obtained to participate. Your consent will be active for this visit and any virtual visit you may have with one of our providers in the next 365 days. If you have a MyChart account, a copy of this consent can be sent to you electronically.  As this is a virtual visit, video technology does not allow for your provider to perform a traditional examination. This may limit your provider's ability to fully assess your condition. If your provider identifies any concerns that need to be evaluated in person or the need to arrange testing (such as labs, EKG, etc.), we will make arrangements to do so. Although advances in technology are sophisticated, we cannot ensure that it will always work on either your end or our end. If the connection with a video visit is poor, the visit may have to be switched to a telephone visit. With either a video or telephone visit, we are not always able to ensure that we have a secure connection.  By engaging in this virtual visit, you consent to the provision of healthcare and authorize for your insurance to be billed (if applicable) for the services provided during this visit. Depending on your insurance coverage, you may receive a charge related to this service.  I need to obtain your verbal consent now. Are you willing to proceed with your visit today? [name] Collier has provided verbal consent on 09/01/2024 for a virtual visit (video or telephone). Hadassah Fireman, NP  Date: 09/01/2024 11:07 AM   Virtual Visit via Video Note   I, Hadassah Fireman, connected with  [name] Collier  ([id number], 03-04-1996) on 09/01/24 at 11:00 AM EST by a video-enabled telemedicine application and verified that I am speaking with the correct person using two identifiers.  Location: Patient: Virtual Visit Location Patient:  Home Provider: Virtual Visit Location Provider: Home Office   I discussed the limitations of evaluation and management by telemedicine and the availability of in person appointments. The patient expressed understanding and agreed to proceed.    History of Present Illness: [name] Collier is a 29 y.o. who identifies as a female who was assigned female at birth, and is being seen today for possible UTI infection. Burning, urge, painful after urinating for a few days now. She has increased her water  intake to help flush her system, but symptoms are not improving. States last UTI was about a month or two ago. Started to get more frequent UTIs when she started using birth control Phexxi.  Denies Fevers or chills.  She endorses she immediately gets yeast infections after completing course of antibiotics, also asking for diflucan  to be sent in today.   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 Pleasant.  Resting comfortably at home.  Head is normocephalic, atraumatic.  No labored breathing.  Speech is clear and coherent with logical content.  Patient is alert and oriented at baseline.   Assessment and Plan: 1. Suspected UTI (Primary) - cephALEXin  (KEFLEX ) 500 MG capsule; Take 1 capsule (500 mg total) by mouth 2 (two) times daily  for 7 days.  Dispense: 14 capsule; Refill: 0  2. Yeast vaginitis - fluconazole  (DIFLUCAN ) 150 MG tablet; Take 1 tablet (150 mg total) by mouth once for 1 dose.  Dispense: 1 tablet; Refill: 0  Based on symptoms, most likely recurrent UTI. Rx for keflex  sent to pharmacy of choice.  Discussed proper diflucan  administration AFTER completion of antibiotics.   Increase probiotic intake while on abx.  Push fluids.  Red flag s/s discussed that would warrant further evaluation in person.  Advised to f/u with PCP due to recurrent UTIs that have increased with birth control method. All questions answered. States understanding and agrees with plan.   Follow Up Instructions: I discussed the assessment and treatment plan with the patient. The patient was provided an opportunity to ask questions and all were answered. The patient agreed with the plan and demonstrated an understanding of the instructions.  A copy of instructions were sent to the patient via MyChart unless otherwise noted below.   Patient has requested to receive PHI (AVS, Work Notes, etc) pertaining to this video visit through e-mail as they are currently without active MyChart. They have voiced understand that email is not considered secure and their health information could be viewed by someone other than the patient.   The patient was advised to call back or seek an in-person evaluation if the symptoms worsen or if the condition fails to improve as anticipated.    Hadassah Fireman, NP     [1]  Allergies Allergen Reactions   [facility] (Non-Screening) Anaphylaxis   Cherry Anaphylaxis and Rash    Tongue tingling   Peach [Prunus Persica] Anaphylaxis and Rash    Tongue tingling   Peanut -Containing Drug Products Anaphylaxis   Almond (Diagnostic) Other (See Comments)    Tongue tingling   Anacardium Occidentale    Aspirin  Other (See Comments)    Nosebleeds   Brazil Nut (Berthollefia Excelsa)    Macadamia Nut Oil    Pistachio Nut (Diagnostic)   [2]  Current Outpatient Medications:    cephALEXin  (KEFLEX ) 500 MG capsule, Take 1 capsule (500 mg total) by mouth 2 (two) times daily for 7 days., Disp: 14 capsule, Rfl: 0   fluconazole  (DIFLUCAN ) 150 MG tablet, Take 1 tablet (150 mg total) by mouth once for 1 dose., Disp: 1 tablet, Rfl: 0   albuterol  (PROVENTIL ) (2.5 MG/3ML)  0.083% nebulizer solution, Take 3 mLs (2.5 mg total) by nebulization every 6 (six) hours as needed for wheezing or shortness of breath., Disp: 75 mL, Rfl: 12   albuterol  (VENTOLIN  HFA) 108 (90 Base) MCG/ACT inhaler, Inhale 2 puffs into the lungs every 6 (six) hours as needed., Disp: 8 g, Rfl: 1   azelastine  (ASTELIN ) 0.1 % nasal spray, Place 1 spray into both nostrils 2 (two) times daily as needed. Use in each nostril as directed, Disp: 30 mL, Rfl: 5   cetirizine  (ZYRTEC  ALLERGY ) 10 MG tablet, Take 1 tablet (10 mg total) by mouth daily as needed for allergies., Disp: 30 tablet, Rfl: 0   cholecalciferol (VITAMIN D3) 25 MCG (1000 UNIT) tablet, Take 1,000 Units by mouth daily. (Patient not taking: Reported on 09/01/2024), Disp: , Rfl:    EPINEPHrine  (EPIPEN  2-PAK) 0.3 mg/0.3 mL IJ SOAJ injection, Inject 0.3 mg into the muscle as needed for anaphylaxis., Disp: 2 each, Rfl: 1   ferrous sulfate  325 (65 FE) MG tablet, Take 1 tablet (325 mg total) by mouth every other day for 30 doses., Disp: 30 tablet, Rfl: 0  ibuprofen  (ADVIL ) 600 MG tablet, Take 1 tablet (600 mg total) by mouth every 6 (six) hours as needed. (Patient not taking: Reported on 10/31/2023), Disp: 30 tablet, Rfl: 0   nitrofurantoin , macrocrystal-monohydrate, (MACROBID ) 100 MG capsule, Take 1 capsule (100 mg total) by mouth 2 (two) times daily., Disp: 14 capsule, Rfl: 1   nystatin  cream (MYCOSTATIN ), Apply 2 g to affected inner thigh area twice a day for 10 days, Disp: 60 g, Rfl: 1   polyethylene glycol powder (GLYCOLAX /MIRALAX ) 17 GM/SCOOP powder, Take 17 g by mouth daily as needed. (Patient not taking: Reported on 10/31/2023), Disp: 238 g, Rfl: 0   Prenat-FeFum-FePo-FA-Omega 3 (TARON-C DHA) 35-1 MG CAPS, Take 1 capsule by mouth daily., Disp: 30 capsule, Rfl: 10   triamcinolone  cream (KENALOG ) 0.5 %, Apply [place] amount (about 1 g) to clitoris twice a day for 7 days, Disp: 30 g, Rfl: 0  "

## 2024-09-01 NOTE — Patient Instructions (Signed)
 " Nylan C Barber, thank you for joining Hadassah Fireman, NP for today's virtual visit.  While this provider is not your primary care provider (PCP), if your PCP is located in our provider database this encounter information will be shared with them immediately following your visit.   A [facility] MyChart account gives you access to today's visit and all your visits, tests, and labs performed at [facility]  click here if you don't have a [facility] MyChart account or go to [email or web address]  Consent: (Patient) [name] Collier provided verbal consent for this virtual visit at the beginning of the encounter.  Current Medications:  Current Outpatient Medications:    cephALEXin  (KEFLEX ) 500 MG capsule, Take 1 capsule (500 mg total) by mouth 2 (two) times daily for 7 days., Disp: 14 capsule, Rfl: 0   fluconazole  (DIFLUCAN ) 150 MG tablet, Take 1 tablet (150 mg total) by mouth once for 1 dose., Disp: 1 tablet, Rfl: 0   albuterol  (PROVENTIL ) (2.5 MG/3ML) 0.083% nebulizer solution, Take 3 mLs (2.5 mg total) by nebulization every 6 (six) hours as needed for wheezing or shortness of breath., Disp: 75 mL, Rfl: 12   albuterol  (VENTOLIN  HFA) 108 (90 Base) MCG/ACT inhaler, Inhale 2 puffs into the lungs every 6 (six) hours as needed., Disp: 8 g, Rfl: 1   azelastine  (ASTELIN ) 0.1 % nasal spray, Place 1 spray into both nostrils 2 (two) times daily as needed. Use in each nostril as directed, Disp: 30 mL, Rfl: 5   cetirizine  (ZYRTEC  ALLERGY ) 10 MG tablet, Take 1 tablet (10 mg total) by mouth daily as needed for allergies., Disp: 30 tablet, Rfl: 0   cholecalciferol (VITAMIN D3) 25 MCG (1000 UNIT) tablet, Take 1,000 Units by mouth daily. (Patient not taking: Reported on 09/01/2024), Disp: , Rfl:    EPINEPHrine  (EPIPEN  2-PAK) 0.3 mg/0.3 mL IJ SOAJ injection, Inject 0.3 mg into the muscle as needed for anaphylaxis., Disp: 2 each, Rfl: 1   ferrous sulfate  325 (65 FE) MG tablet, Take 1 tablet (325 mg  total) by mouth every other day for 30 doses., Disp: 30 tablet, Rfl: 0   ibuprofen  (ADVIL ) 600 MG tablet, Take 1 tablet (600 mg total) by mouth every 6 (six) hours as needed. (Patient not taking: Reported on 10/31/2023), Disp: 30 tablet, Rfl: 0   nitrofurantoin , macrocrystal-monohydrate, (MACROBID ) 100 MG capsule, Take 1 capsule (100 mg total) by mouth 2 (two) times daily., Disp: 14 capsule, Rfl: 1   nystatin  cream (MYCOSTATIN ), Apply 2 g to affected inner thigh area twice a day for 10 days, Disp: 60 g, Rfl: 1   polyethylene glycol powder (GLYCOLAX /MIRALAX ) 17 GM/SCOOP powder, Take 17 g by mouth daily as needed. (Patient not taking: Reported on 10/31/2023), Disp: 238 g, Rfl: 0   Prenat-FeFum-FePo-FA-Omega 3 (TARON-C DHA) 35-1 MG CAPS, Take 1 capsule by mouth daily., Disp: 30 capsule, Rfl: 10   triamcinolone  cream (KENALOG ) 0.5 %, Apply [place]ti amount (about 1 g) to clitoris twice a day for 7 days, Disp: 30 g, Rfl: 0   Medications ordered in this encounter:  Meds ordered this encounter  Medications   fluconazole  (DIFLUCAN ) 150 MG tablet    Sig: Take 1 tablet (150 mg total) by mouth once for 1 dose.    Dispense:  1 tablet    Refill:  0    Supervising Provider:   LAMPTEY, PHILIP O [[id number]]   cephALEXin  (KEFLEX ) 500 MG capsule    Sig: Take 1 capsule (500 mg total) by mouth 2 (  two) times daily for 7 days.    Dispense:  14 capsule    Refill:  0    Supervising Provider:   BLAISE ALEENE KIDD [[id number]]     *If you need refills on other medications prior to your next appointment, please contact your pharmacy*  Follow-Up: Call back or seek an in-person evaluation if the symptoms worsen or if the condition fails to improve as anticipated.   Other Instructions Please pick up your antibiotic and antifungal prescriptions at your pharmacy.  Do not take the diflucan  until you have completed the course of antibiotics as instructed.  Drink plenty of water /fluids for hydration  Please seek in person  evaluation if symptoms persist or worsen, despite treatment.   If you have been instructed to have an in-person evaluation today at a local Urgent Care facility, please use the link below. It will take you to a list of all of our available [facility], including address, phone number and hours of operation. Please do not delay care.  [facility]  If you or a family member do not have a primary care provider, use the link below to schedule a visit and establish care. When you choose a [facility] primary care physician or advanced practice provider, you gain a long-term partner in health. Find a Primary Care Provider  Learn more about [facility] in-office and virtual care options:  - Get Care Now  "
